# Patient Record
Sex: Female | Born: 1940 | Race: White | Hispanic: No | Marital: Married | Smoking: Never smoker
Health system: Southern US, Community
[De-identification: ages and names within clinical notes are randomized; demographics above are authoritative.]

## PROBLEM LIST (undated history)

## (undated) DIAGNOSIS — Z9889 Other specified postprocedural states: Secondary | ICD-10-CM

## (undated) DIAGNOSIS — C801 Malignant (primary) neoplasm, unspecified: Secondary | ICD-10-CM

## (undated) DIAGNOSIS — R002 Palpitations: Secondary | ICD-10-CM

## (undated) DIAGNOSIS — K589 Irritable bowel syndrome without diarrhea: Secondary | ICD-10-CM

## (undated) DIAGNOSIS — R0689 Other abnormalities of breathing: Secondary | ICD-10-CM

## (undated) DIAGNOSIS — I10 Essential (primary) hypertension: Secondary | ICD-10-CM

## (undated) DIAGNOSIS — R112 Nausea with vomiting, unspecified: Secondary | ICD-10-CM

## (undated) DIAGNOSIS — K219 Gastro-esophageal reflux disease without esophagitis: Secondary | ICD-10-CM

## (undated) DIAGNOSIS — N189 Chronic kidney disease, unspecified: Secondary | ICD-10-CM

## (undated) DIAGNOSIS — R609 Edema, unspecified: Secondary | ICD-10-CM

## (undated) DIAGNOSIS — M791 Myalgia, unspecified site: Secondary | ICD-10-CM

## (undated) DIAGNOSIS — E119 Type 2 diabetes mellitus without complications: Secondary | ICD-10-CM

## (undated) DIAGNOSIS — F32A Depression, unspecified: Secondary | ICD-10-CM

## (undated) HISTORY — DX: Palpitations: R00.2

## (undated) HISTORY — DX: Type 2 diabetes mellitus without complications: E11.9

## (undated) HISTORY — DX: Irritable bowel syndrome, unspecified: K58.9

## (undated) HISTORY — PX: COLONOSCOPY: SHX174

## (undated) HISTORY — DX: Essential (primary) hypertension: I10

## (undated) HISTORY — DX: Malignant (primary) neoplasm, unspecified: C80.1

## (undated) HISTORY — PX: BREAST SURGERY: SHX581

## (undated) HISTORY — PX: ABDOMINAL HYSTERECTOMY: SHX81

## (undated) HISTORY — PX: CHOLECYSTECTOMY: SHX55

## (undated) HISTORY — PX: OVARY SURGERY: SHX727

## (undated) HISTORY — DX: Other abnormalities of breathing: R06.89

## (undated) HISTORY — DX: Edema, unspecified: R60.9

## (undated) HISTORY — PX: ANKLE FRACTURE SURGERY: SHX122

## (undated) HISTORY — DX: Myalgia, unspecified site: M79.10

## (undated) HISTORY — DX: Gastro-esophageal reflux disease without esophagitis: K21.9

---

## 2011-12-20 DIAGNOSIS — N951 Menopausal and female climacteric states: Secondary | ICD-10-CM | POA: Diagnosis not present

## 2011-12-20 DIAGNOSIS — E7800 Pure hypercholesterolemia, unspecified: Secondary | ICD-10-CM | POA: Diagnosis not present

## 2011-12-20 DIAGNOSIS — F411 Generalized anxiety disorder: Secondary | ICD-10-CM | POA: Diagnosis not present

## 2011-12-20 DIAGNOSIS — K219 Gastro-esophageal reflux disease without esophagitis: Secondary | ICD-10-CM | POA: Diagnosis not present

## 2012-01-01 DIAGNOSIS — J209 Acute bronchitis, unspecified: Secondary | ICD-10-CM | POA: Diagnosis not present

## 2012-01-01 DIAGNOSIS — E7800 Pure hypercholesterolemia, unspecified: Secondary | ICD-10-CM | POA: Diagnosis not present

## 2012-01-01 DIAGNOSIS — N951 Menopausal and female climacteric states: Secondary | ICD-10-CM | POA: Diagnosis not present

## 2012-01-01 DIAGNOSIS — R252 Cramp and spasm: Secondary | ICD-10-CM | POA: Diagnosis not present

## 2012-01-06 DIAGNOSIS — R252 Cramp and spasm: Secondary | ICD-10-CM | POA: Diagnosis not present

## 2012-01-06 DIAGNOSIS — N951 Menopausal and female climacteric states: Secondary | ICD-10-CM | POA: Diagnosis not present

## 2012-01-06 DIAGNOSIS — R197 Diarrhea, unspecified: Secondary | ICD-10-CM | POA: Diagnosis not present

## 2012-01-06 DIAGNOSIS — E7800 Pure hypercholesterolemia, unspecified: Secondary | ICD-10-CM | POA: Diagnosis not present

## 2012-01-15 DIAGNOSIS — K591 Functional diarrhea: Secondary | ICD-10-CM | POA: Diagnosis not present

## 2012-01-16 DIAGNOSIS — K591 Functional diarrhea: Secondary | ICD-10-CM | POA: Diagnosis not present

## 2012-02-05 DIAGNOSIS — K591 Functional diarrhea: Secondary | ICD-10-CM | POA: Diagnosis not present

## 2012-02-21 DIAGNOSIS — Z87891 Personal history of nicotine dependence: Secondary | ICD-10-CM | POA: Diagnosis not present

## 2012-02-21 DIAGNOSIS — I10 Essential (primary) hypertension: Secondary | ICD-10-CM | POA: Diagnosis not present

## 2012-02-21 DIAGNOSIS — Z853 Personal history of malignant neoplasm of breast: Secondary | ICD-10-CM | POA: Diagnosis not present

## 2012-02-21 DIAGNOSIS — Z8719 Personal history of other diseases of the digestive system: Secondary | ICD-10-CM | POA: Diagnosis not present

## 2012-02-21 DIAGNOSIS — D1330 Benign neoplasm of unspecified part of small intestine: Secondary | ICD-10-CM | POA: Diagnosis not present

## 2012-02-21 DIAGNOSIS — Z79899 Other long term (current) drug therapy: Secondary | ICD-10-CM | POA: Diagnosis not present

## 2012-02-21 DIAGNOSIS — D126 Benign neoplasm of colon, unspecified: Secondary | ICD-10-CM | POA: Diagnosis not present

## 2012-02-21 DIAGNOSIS — Z1211 Encounter for screening for malignant neoplasm of colon: Secondary | ICD-10-CM | POA: Diagnosis not present

## 2012-02-21 DIAGNOSIS — R197 Diarrhea, unspecified: Secondary | ICD-10-CM | POA: Diagnosis not present

## 2012-02-21 DIAGNOSIS — Z86010 Personal history of colonic polyps: Secondary | ICD-10-CM | POA: Diagnosis not present

## 2012-02-21 DIAGNOSIS — E7800 Pure hypercholesterolemia, unspecified: Secondary | ICD-10-CM | POA: Diagnosis not present

## 2012-02-21 DIAGNOSIS — F411 Generalized anxiety disorder: Secondary | ICD-10-CM | POA: Diagnosis not present

## 2012-02-21 DIAGNOSIS — E119 Type 2 diabetes mellitus without complications: Secondary | ICD-10-CM | POA: Diagnosis not present

## 2012-02-24 DIAGNOSIS — C50419 Malignant neoplasm of upper-outer quadrant of unspecified female breast: Secondary | ICD-10-CM | POA: Diagnosis not present

## 2012-02-27 DIAGNOSIS — L820 Inflamed seborrheic keratosis: Secondary | ICD-10-CM | POA: Diagnosis not present

## 2012-02-27 DIAGNOSIS — L723 Sebaceous cyst: Secondary | ICD-10-CM | POA: Diagnosis not present

## 2012-03-19 DIAGNOSIS — R252 Cramp and spasm: Secondary | ICD-10-CM | POA: Diagnosis not present

## 2012-03-19 DIAGNOSIS — E7800 Pure hypercholesterolemia, unspecified: Secondary | ICD-10-CM | POA: Diagnosis not present

## 2012-03-19 DIAGNOSIS — Z6826 Body mass index (BMI) 26.0-26.9, adult: Secondary | ICD-10-CM | POA: Diagnosis not present

## 2012-03-19 DIAGNOSIS — F411 Generalized anxiety disorder: Secondary | ICD-10-CM | POA: Diagnosis not present

## 2012-03-19 DIAGNOSIS — E119 Type 2 diabetes mellitus without complications: Secondary | ICD-10-CM | POA: Diagnosis not present

## 2012-03-19 DIAGNOSIS — I10 Essential (primary) hypertension: Secondary | ICD-10-CM | POA: Diagnosis not present

## 2012-03-26 DIAGNOSIS — K591 Functional diarrhea: Secondary | ICD-10-CM | POA: Diagnosis not present

## 2012-04-20 DIAGNOSIS — E119 Type 2 diabetes mellitus without complications: Secondary | ICD-10-CM | POA: Diagnosis not present

## 2012-04-20 DIAGNOSIS — N189 Chronic kidney disease, unspecified: Secondary | ICD-10-CM | POA: Diagnosis not present

## 2012-04-20 DIAGNOSIS — E7800 Pure hypercholesterolemia, unspecified: Secondary | ICD-10-CM | POA: Diagnosis not present

## 2012-04-20 DIAGNOSIS — K219 Gastro-esophageal reflux disease without esophagitis: Secondary | ICD-10-CM | POA: Diagnosis not present

## 2012-05-04 DIAGNOSIS — I10 Essential (primary) hypertension: Secondary | ICD-10-CM | POA: Diagnosis not present

## 2012-05-04 DIAGNOSIS — E7800 Pure hypercholesterolemia, unspecified: Secondary | ICD-10-CM | POA: Diagnosis not present

## 2012-05-04 DIAGNOSIS — R252 Cramp and spasm: Secondary | ICD-10-CM | POA: Diagnosis not present

## 2012-05-04 DIAGNOSIS — E119 Type 2 diabetes mellitus without complications: Secondary | ICD-10-CM | POA: Diagnosis not present

## 2012-06-04 DIAGNOSIS — M76899 Other specified enthesopathies of unspecified lower limb, excluding foot: Secondary | ICD-10-CM | POA: Diagnosis not present

## 2012-06-04 DIAGNOSIS — M25559 Pain in unspecified hip: Secondary | ICD-10-CM | POA: Diagnosis not present

## 2012-06-05 DIAGNOSIS — E7800 Pure hypercholesterolemia, unspecified: Secondary | ICD-10-CM | POA: Diagnosis not present

## 2012-06-05 DIAGNOSIS — N951 Menopausal and female climacteric states: Secondary | ICD-10-CM | POA: Diagnosis not present

## 2012-06-05 DIAGNOSIS — E119 Type 2 diabetes mellitus without complications: Secondary | ICD-10-CM | POA: Diagnosis not present

## 2012-06-05 DIAGNOSIS — K219 Gastro-esophageal reflux disease without esophagitis: Secondary | ICD-10-CM | POA: Diagnosis not present

## 2012-06-09 DIAGNOSIS — N318 Other neuromuscular dysfunction of bladder: Secondary | ICD-10-CM | POA: Diagnosis not present

## 2012-06-09 DIAGNOSIS — R109 Unspecified abdominal pain: Secondary | ICD-10-CM | POA: Diagnosis not present

## 2012-06-09 DIAGNOSIS — N390 Urinary tract infection, site not specified: Secondary | ICD-10-CM | POA: Diagnosis not present

## 2012-06-10 DIAGNOSIS — M899 Disorder of bone, unspecified: Secondary | ICD-10-CM | POA: Diagnosis not present

## 2012-06-10 DIAGNOSIS — Z13820 Encounter for screening for osteoporosis: Secondary | ICD-10-CM | POA: Diagnosis not present

## 2012-06-11 DIAGNOSIS — N390 Urinary tract infection, site not specified: Secondary | ICD-10-CM | POA: Diagnosis not present

## 2012-06-12 DIAGNOSIS — M25569 Pain in unspecified knee: Secondary | ICD-10-CM | POA: Diagnosis not present

## 2012-06-12 DIAGNOSIS — M76899 Other specified enthesopathies of unspecified lower limb, excluding foot: Secondary | ICD-10-CM | POA: Diagnosis not present

## 2012-06-12 DIAGNOSIS — M25659 Stiffness of unspecified hip, not elsewhere classified: Secondary | ICD-10-CM | POA: Diagnosis not present

## 2012-06-12 DIAGNOSIS — M6281 Muscle weakness (generalized): Secondary | ICD-10-CM | POA: Diagnosis not present

## 2012-06-12 DIAGNOSIS — M25559 Pain in unspecified hip: Secondary | ICD-10-CM | POA: Diagnosis not present

## 2012-06-12 DIAGNOSIS — IMO0001 Reserved for inherently not codable concepts without codable children: Secondary | ICD-10-CM | POA: Diagnosis not present

## 2012-06-22 DIAGNOSIS — E119 Type 2 diabetes mellitus without complications: Secondary | ICD-10-CM | POA: Diagnosis not present

## 2012-06-22 DIAGNOSIS — R252 Cramp and spasm: Secondary | ICD-10-CM | POA: Diagnosis not present

## 2012-06-22 DIAGNOSIS — I10 Essential (primary) hypertension: Secondary | ICD-10-CM | POA: Diagnosis not present

## 2012-06-22 DIAGNOSIS — E7800 Pure hypercholesterolemia, unspecified: Secondary | ICD-10-CM | POA: Diagnosis not present

## 2012-06-22 DIAGNOSIS — N951 Menopausal and female climacteric states: Secondary | ICD-10-CM | POA: Diagnosis not present

## 2012-06-22 DIAGNOSIS — K219 Gastro-esophageal reflux disease without esophagitis: Secondary | ICD-10-CM | POA: Diagnosis not present

## 2012-06-25 DIAGNOSIS — Z09 Encounter for follow-up examination after completed treatment for conditions other than malignant neoplasm: Secondary | ICD-10-CM | POA: Diagnosis not present

## 2012-06-25 DIAGNOSIS — C50419 Malignant neoplasm of upper-outer quadrant of unspecified female breast: Secondary | ICD-10-CM | POA: Diagnosis not present

## 2012-07-06 DIAGNOSIS — N951 Menopausal and female climacteric states: Secondary | ICD-10-CM | POA: Diagnosis not present

## 2012-07-06 DIAGNOSIS — E7800 Pure hypercholesterolemia, unspecified: Secondary | ICD-10-CM | POA: Diagnosis not present

## 2012-07-06 DIAGNOSIS — R252 Cramp and spasm: Secondary | ICD-10-CM | POA: Diagnosis not present

## 2012-07-06 DIAGNOSIS — I10 Essential (primary) hypertension: Secondary | ICD-10-CM | POA: Diagnosis not present

## 2012-07-13 DIAGNOSIS — E7800 Pure hypercholesterolemia, unspecified: Secondary | ICD-10-CM | POA: Diagnosis not present

## 2012-07-13 DIAGNOSIS — R42 Dizziness and giddiness: Secondary | ICD-10-CM | POA: Diagnosis not present

## 2012-07-13 DIAGNOSIS — I10 Essential (primary) hypertension: Secondary | ICD-10-CM | POA: Diagnosis not present

## 2012-07-13 DIAGNOSIS — R252 Cramp and spasm: Secondary | ICD-10-CM | POA: Diagnosis not present

## 2012-08-07 DIAGNOSIS — I10 Essential (primary) hypertension: Secondary | ICD-10-CM | POA: Diagnosis not present

## 2012-08-07 DIAGNOSIS — R609 Edema, unspecified: Secondary | ICD-10-CM | POA: Diagnosis not present

## 2012-08-07 DIAGNOSIS — N951 Menopausal and female climacteric states: Secondary | ICD-10-CM | POA: Diagnosis not present

## 2012-08-07 DIAGNOSIS — E7800 Pure hypercholesterolemia, unspecified: Secondary | ICD-10-CM | POA: Diagnosis not present

## 2012-08-24 DIAGNOSIS — C50419 Malignant neoplasm of upper-outer quadrant of unspecified female breast: Secondary | ICD-10-CM | POA: Diagnosis not present

## 2012-09-22 DIAGNOSIS — Z23 Encounter for immunization: Secondary | ICD-10-CM | POA: Diagnosis not present

## 2012-09-22 DIAGNOSIS — Z6828 Body mass index (BMI) 28.0-28.9, adult: Secondary | ICD-10-CM | POA: Diagnosis not present

## 2012-09-22 DIAGNOSIS — E119 Type 2 diabetes mellitus without complications: Secondary | ICD-10-CM | POA: Diagnosis not present

## 2012-09-22 DIAGNOSIS — K219 Gastro-esophageal reflux disease without esophagitis: Secondary | ICD-10-CM | POA: Diagnosis not present

## 2012-09-22 DIAGNOSIS — N951 Menopausal and female climacteric states: Secondary | ICD-10-CM | POA: Diagnosis not present

## 2012-09-22 DIAGNOSIS — I10 Essential (primary) hypertension: Secondary | ICD-10-CM | POA: Diagnosis not present

## 2012-09-22 DIAGNOSIS — E7800 Pure hypercholesterolemia, unspecified: Secondary | ICD-10-CM | POA: Diagnosis not present

## 2012-11-02 DIAGNOSIS — C50419 Malignant neoplasm of upper-outer quadrant of unspecified female breast: Secondary | ICD-10-CM | POA: Diagnosis not present

## 2012-11-02 DIAGNOSIS — E119 Type 2 diabetes mellitus without complications: Secondary | ICD-10-CM | POA: Diagnosis not present

## 2012-11-02 DIAGNOSIS — K219 Gastro-esophageal reflux disease without esophagitis: Secondary | ICD-10-CM | POA: Diagnosis not present

## 2012-11-02 DIAGNOSIS — I10 Essential (primary) hypertension: Secondary | ICD-10-CM | POA: Diagnosis not present

## 2012-11-02 DIAGNOSIS — C50919 Malignant neoplasm of unspecified site of unspecified female breast: Secondary | ICD-10-CM | POA: Diagnosis not present

## 2012-11-02 DIAGNOSIS — Z09 Encounter for follow-up examination after completed treatment for conditions other than malignant neoplasm: Secondary | ICD-10-CM | POA: Diagnosis not present

## 2012-11-11 DIAGNOSIS — M21169 Varus deformity, not elsewhere classified, unspecified knee: Secondary | ICD-10-CM | POA: Diagnosis not present

## 2012-11-11 DIAGNOSIS — R269 Unspecified abnormalities of gait and mobility: Secondary | ICD-10-CM | POA: Diagnosis not present

## 2012-11-11 DIAGNOSIS — M25569 Pain in unspecified knee: Secondary | ICD-10-CM | POA: Diagnosis not present

## 2012-11-11 DIAGNOSIS — M1710 Unilateral primary osteoarthritis, unspecified knee: Secondary | ICD-10-CM | POA: Diagnosis not present

## 2012-11-25 DIAGNOSIS — E119 Type 2 diabetes mellitus without complications: Secondary | ICD-10-CM | POA: Diagnosis not present

## 2012-12-02 DIAGNOSIS — Z6828 Body mass index (BMI) 28.0-28.9, adult: Secondary | ICD-10-CM | POA: Diagnosis not present

## 2012-12-02 DIAGNOSIS — N189 Chronic kidney disease, unspecified: Secondary | ICD-10-CM | POA: Diagnosis not present

## 2012-12-02 DIAGNOSIS — I10 Essential (primary) hypertension: Secondary | ICD-10-CM | POA: Diagnosis not present

## 2012-12-02 DIAGNOSIS — K219 Gastro-esophageal reflux disease without esophagitis: Secondary | ICD-10-CM | POA: Diagnosis not present

## 2012-12-21 DIAGNOSIS — E7800 Pure hypercholesterolemia, unspecified: Secondary | ICD-10-CM | POA: Diagnosis not present

## 2012-12-21 DIAGNOSIS — Z9181 History of falling: Secondary | ICD-10-CM | POA: Diagnosis not present

## 2012-12-21 DIAGNOSIS — Z1331 Encounter for screening for depression: Secondary | ICD-10-CM | POA: Diagnosis not present

## 2012-12-21 DIAGNOSIS — K219 Gastro-esophageal reflux disease without esophagitis: Secondary | ICD-10-CM | POA: Diagnosis not present

## 2012-12-21 DIAGNOSIS — E119 Type 2 diabetes mellitus without complications: Secondary | ICD-10-CM | POA: Diagnosis not present

## 2012-12-21 DIAGNOSIS — I10 Essential (primary) hypertension: Secondary | ICD-10-CM | POA: Diagnosis not present

## 2013-01-07 DIAGNOSIS — Z6829 Body mass index (BMI) 29.0-29.9, adult: Secondary | ICD-10-CM | POA: Diagnosis not present

## 2013-01-07 DIAGNOSIS — I10 Essential (primary) hypertension: Secondary | ICD-10-CM | POA: Diagnosis not present

## 2013-01-07 DIAGNOSIS — J209 Acute bronchitis, unspecified: Secondary | ICD-10-CM | POA: Diagnosis not present

## 2013-01-07 DIAGNOSIS — K219 Gastro-esophageal reflux disease without esophagitis: Secondary | ICD-10-CM | POA: Diagnosis not present

## 2013-01-07 DIAGNOSIS — E7800 Pure hypercholesterolemia, unspecified: Secondary | ICD-10-CM | POA: Diagnosis not present

## 2013-01-20 DIAGNOSIS — B351 Tinea unguium: Secondary | ICD-10-CM | POA: Diagnosis not present

## 2013-01-20 DIAGNOSIS — E1149 Type 2 diabetes mellitus with other diabetic neurological complication: Secondary | ICD-10-CM | POA: Diagnosis not present

## 2013-01-21 DIAGNOSIS — K219 Gastro-esophageal reflux disease without esophagitis: Secondary | ICD-10-CM | POA: Diagnosis not present

## 2013-01-21 DIAGNOSIS — I10 Essential (primary) hypertension: Secondary | ICD-10-CM | POA: Diagnosis not present

## 2013-01-21 DIAGNOSIS — C50919 Malignant neoplasm of unspecified site of unspecified female breast: Secondary | ICD-10-CM | POA: Diagnosis not present

## 2013-01-21 DIAGNOSIS — Z6829 Body mass index (BMI) 29.0-29.9, adult: Secondary | ICD-10-CM | POA: Diagnosis not present

## 2013-02-24 DIAGNOSIS — L820 Inflamed seborrheic keratosis: Secondary | ICD-10-CM | POA: Diagnosis not present

## 2013-02-24 DIAGNOSIS — L821 Other seborrheic keratosis: Secondary | ICD-10-CM | POA: Diagnosis not present

## 2013-03-01 DIAGNOSIS — H8110 Benign paroxysmal vertigo, unspecified ear: Secondary | ICD-10-CM | POA: Diagnosis not present

## 2013-03-01 DIAGNOSIS — H9190 Unspecified hearing loss, unspecified ear: Secondary | ICD-10-CM | POA: Diagnosis not present

## 2013-03-01 DIAGNOSIS — R42 Dizziness and giddiness: Secondary | ICD-10-CM | POA: Diagnosis not present

## 2013-03-01 DIAGNOSIS — J342 Deviated nasal septum: Secondary | ICD-10-CM | POA: Diagnosis not present

## 2013-03-17 DIAGNOSIS — H903 Sensorineural hearing loss, bilateral: Secondary | ICD-10-CM | POA: Diagnosis not present

## 2013-03-17 DIAGNOSIS — H8110 Benign paroxysmal vertigo, unspecified ear: Secondary | ICD-10-CM | POA: Diagnosis not present

## 2013-03-17 DIAGNOSIS — R42 Dizziness and giddiness: Secondary | ICD-10-CM | POA: Diagnosis not present

## 2013-03-30 DIAGNOSIS — H8110 Benign paroxysmal vertigo, unspecified ear: Secondary | ICD-10-CM | POA: Diagnosis not present

## 2013-04-06 DIAGNOSIS — M1710 Unilateral primary osteoarthritis, unspecified knee: Secondary | ICD-10-CM | POA: Diagnosis not present

## 2013-05-04 DIAGNOSIS — Z09 Encounter for follow-up examination after completed treatment for conditions other than malignant neoplasm: Secondary | ICD-10-CM | POA: Diagnosis not present

## 2013-05-04 DIAGNOSIS — C50419 Malignant neoplasm of upper-outer quadrant of unspecified female breast: Secondary | ICD-10-CM | POA: Diagnosis not present

## 2013-05-04 DIAGNOSIS — D692 Other nonthrombocytopenic purpura: Secondary | ICD-10-CM | POA: Diagnosis not present

## 2013-05-18 DIAGNOSIS — M1710 Unilateral primary osteoarthritis, unspecified knee: Secondary | ICD-10-CM | POA: Diagnosis not present

## 2013-06-03 DIAGNOSIS — E119 Type 2 diabetes mellitus without complications: Secondary | ICD-10-CM | POA: Diagnosis not present

## 2013-06-25 DIAGNOSIS — H2510 Age-related nuclear cataract, unspecified eye: Secondary | ICD-10-CM | POA: Diagnosis not present

## 2013-06-29 DIAGNOSIS — Z6829 Body mass index (BMI) 29.0-29.9, adult: Secondary | ICD-10-CM | POA: Diagnosis not present

## 2013-06-29 DIAGNOSIS — E119 Type 2 diabetes mellitus without complications: Secondary | ICD-10-CM | POA: Diagnosis not present

## 2013-06-29 DIAGNOSIS — N189 Chronic kidney disease, unspecified: Secondary | ICD-10-CM | POA: Diagnosis not present

## 2013-06-29 DIAGNOSIS — K219 Gastro-esophageal reflux disease without esophagitis: Secondary | ICD-10-CM | POA: Diagnosis not present

## 2013-06-29 DIAGNOSIS — I10 Essential (primary) hypertension: Secondary | ICD-10-CM | POA: Diagnosis not present

## 2013-07-13 DIAGNOSIS — I10 Essential (primary) hypertension: Secondary | ICD-10-CM | POA: Diagnosis not present

## 2013-07-13 DIAGNOSIS — H269 Unspecified cataract: Secondary | ICD-10-CM | POA: Diagnosis not present

## 2013-07-13 DIAGNOSIS — H2589 Other age-related cataract: Secondary | ICD-10-CM | POA: Diagnosis not present

## 2013-07-13 DIAGNOSIS — E119 Type 2 diabetes mellitus without complications: Secondary | ICD-10-CM | POA: Diagnosis not present

## 2013-07-27 DIAGNOSIS — H269 Unspecified cataract: Secondary | ICD-10-CM | POA: Diagnosis not present

## 2013-07-27 DIAGNOSIS — I10 Essential (primary) hypertension: Secondary | ICD-10-CM | POA: Diagnosis not present

## 2013-07-27 DIAGNOSIS — H259 Unspecified age-related cataract: Secondary | ICD-10-CM | POA: Diagnosis not present

## 2013-07-27 DIAGNOSIS — E119 Type 2 diabetes mellitus without complications: Secondary | ICD-10-CM | POA: Diagnosis not present

## 2013-07-27 DIAGNOSIS — H2589 Other age-related cataract: Secondary | ICD-10-CM | POA: Diagnosis not present

## 2013-08-04 DIAGNOSIS — Q828 Other specified congenital malformations of skin: Secondary | ICD-10-CM | POA: Diagnosis not present

## 2013-08-04 DIAGNOSIS — E1149 Type 2 diabetes mellitus with other diabetic neurological complication: Secondary | ICD-10-CM | POA: Diagnosis not present

## 2013-08-18 DIAGNOSIS — K219 Gastro-esophageal reflux disease without esophagitis: Secondary | ICD-10-CM | POA: Diagnosis not present

## 2013-08-18 DIAGNOSIS — R5383 Other fatigue: Secondary | ICD-10-CM | POA: Diagnosis not present

## 2013-08-18 DIAGNOSIS — E119 Type 2 diabetes mellitus without complications: Secondary | ICD-10-CM | POA: Diagnosis not present

## 2013-08-18 DIAGNOSIS — R5381 Other malaise: Secondary | ICD-10-CM | POA: Diagnosis not present

## 2013-08-18 DIAGNOSIS — C50919 Malignant neoplasm of unspecified site of unspecified female breast: Secondary | ICD-10-CM | POA: Diagnosis not present

## 2013-08-19 DIAGNOSIS — M1710 Unilateral primary osteoarthritis, unspecified knee: Secondary | ICD-10-CM | POA: Diagnosis not present

## 2013-08-25 DIAGNOSIS — C50419 Malignant neoplasm of upper-outer quadrant of unspecified female breast: Secondary | ICD-10-CM | POA: Diagnosis not present

## 2013-08-25 DIAGNOSIS — Z9889 Other specified postprocedural states: Secondary | ICD-10-CM | POA: Diagnosis not present

## 2013-09-01 DIAGNOSIS — K219 Gastro-esophageal reflux disease without esophagitis: Secondary | ICD-10-CM | POA: Diagnosis not present

## 2013-09-01 DIAGNOSIS — Z6829 Body mass index (BMI) 29.0-29.9, adult: Secondary | ICD-10-CM | POA: Diagnosis not present

## 2013-09-01 DIAGNOSIS — F411 Generalized anxiety disorder: Secondary | ICD-10-CM | POA: Diagnosis not present

## 2013-09-01 DIAGNOSIS — E119 Type 2 diabetes mellitus without complications: Secondary | ICD-10-CM | POA: Diagnosis not present

## 2013-09-01 DIAGNOSIS — E1142 Type 2 diabetes mellitus with diabetic polyneuropathy: Secondary | ICD-10-CM | POA: Diagnosis not present

## 2013-09-13 DIAGNOSIS — M1710 Unilateral primary osteoarthritis, unspecified knee: Secondary | ICD-10-CM | POA: Diagnosis not present

## 2013-09-20 DIAGNOSIS — M1710 Unilateral primary osteoarthritis, unspecified knee: Secondary | ICD-10-CM | POA: Diagnosis not present

## 2013-09-27 DIAGNOSIS — M1710 Unilateral primary osteoarthritis, unspecified knee: Secondary | ICD-10-CM | POA: Diagnosis not present

## 2013-10-12 DIAGNOSIS — E7800 Pure hypercholesterolemia, unspecified: Secondary | ICD-10-CM | POA: Diagnosis not present

## 2013-10-12 DIAGNOSIS — K219 Gastro-esophageal reflux disease without esophagitis: Secondary | ICD-10-CM | POA: Diagnosis not present

## 2013-10-12 DIAGNOSIS — Z6830 Body mass index (BMI) 30.0-30.9, adult: Secondary | ICD-10-CM | POA: Diagnosis not present

## 2013-10-12 DIAGNOSIS — E119 Type 2 diabetes mellitus without complications: Secondary | ICD-10-CM | POA: Diagnosis not present

## 2013-10-12 DIAGNOSIS — I10 Essential (primary) hypertension: Secondary | ICD-10-CM | POA: Diagnosis not present

## 2013-10-12 DIAGNOSIS — Z23 Encounter for immunization: Secondary | ICD-10-CM | POA: Diagnosis not present

## 2013-10-12 DIAGNOSIS — N189 Chronic kidney disease, unspecified: Secondary | ICD-10-CM | POA: Diagnosis not present

## 2013-11-05 DIAGNOSIS — Z09 Encounter for follow-up examination after completed treatment for conditions other than malignant neoplasm: Secondary | ICD-10-CM | POA: Diagnosis not present

## 2013-11-05 DIAGNOSIS — Z853 Personal history of malignant neoplasm of breast: Secondary | ICD-10-CM | POA: Diagnosis not present

## 2014-01-07 DIAGNOSIS — L821 Other seborrheic keratosis: Secondary | ICD-10-CM | POA: Diagnosis not present

## 2014-01-07 DIAGNOSIS — L820 Inflamed seborrheic keratosis: Secondary | ICD-10-CM | POA: Diagnosis not present

## 2014-01-12 DIAGNOSIS — E1142 Type 2 diabetes mellitus with diabetic polyneuropathy: Secondary | ICD-10-CM | POA: Diagnosis not present

## 2014-01-12 DIAGNOSIS — Z9181 History of falling: Secondary | ICD-10-CM | POA: Diagnosis not present

## 2014-01-12 DIAGNOSIS — Z1331 Encounter for screening for depression: Secondary | ICD-10-CM | POA: Diagnosis not present

## 2014-01-12 DIAGNOSIS — G4733 Obstructive sleep apnea (adult) (pediatric): Secondary | ICD-10-CM | POA: Diagnosis not present

## 2014-01-12 DIAGNOSIS — K219 Gastro-esophageal reflux disease without esophagitis: Secondary | ICD-10-CM | POA: Diagnosis not present

## 2014-01-12 DIAGNOSIS — E1149 Type 2 diabetes mellitus with other diabetic neurological complication: Secondary | ICD-10-CM | POA: Diagnosis not present

## 2014-01-12 DIAGNOSIS — I10 Essential (primary) hypertension: Secondary | ICD-10-CM | POA: Diagnosis not present

## 2014-01-13 DIAGNOSIS — E1149 Type 2 diabetes mellitus with other diabetic neurological complication: Secondary | ICD-10-CM | POA: Diagnosis not present

## 2014-01-24 DIAGNOSIS — E1149 Type 2 diabetes mellitus with other diabetic neurological complication: Secondary | ICD-10-CM | POA: Diagnosis not present

## 2014-01-24 DIAGNOSIS — E1142 Type 2 diabetes mellitus with diabetic polyneuropathy: Secondary | ICD-10-CM | POA: Diagnosis not present

## 2014-01-24 DIAGNOSIS — K219 Gastro-esophageal reflux disease without esophagitis: Secondary | ICD-10-CM | POA: Diagnosis not present

## 2014-01-24 DIAGNOSIS — Z6829 Body mass index (BMI) 29.0-29.9, adult: Secondary | ICD-10-CM | POA: Diagnosis not present

## 2014-01-26 ENCOUNTER — Ambulatory Visit (INDEPENDENT_AMBULATORY_CARE_PROVIDER_SITE_OTHER): Payer: Medicare Other

## 2014-01-26 VITALS — BP 131/69 | HR 74 | Resp 16

## 2014-01-26 DIAGNOSIS — M2040 Other hammer toe(s) (acquired), unspecified foot: Secondary | ICD-10-CM

## 2014-01-26 DIAGNOSIS — E1142 Type 2 diabetes mellitus with diabetic polyneuropathy: Secondary | ICD-10-CM

## 2014-01-26 DIAGNOSIS — E1140 Type 2 diabetes mellitus with diabetic neuropathy, unspecified: Secondary | ICD-10-CM

## 2014-01-26 DIAGNOSIS — Q828 Other specified congenital malformations of skin: Secondary | ICD-10-CM

## 2014-01-26 DIAGNOSIS — E1149 Type 2 diabetes mellitus with other diabetic neurological complication: Secondary | ICD-10-CM

## 2014-01-26 NOTE — Progress Notes (Signed)
   Subjective:    Patient ID: Anna Stone, female    DOB: [birth year], 73 y.o.   MRN: [id number]  HPI I am here to get my diabetic shoes    Review of Systems  Endocrine: Positive for heat intolerance.  Neurological: Positive for numbness.       Feet   Hematological: Bruises/bleeds easily.  All other systems reviewed and are negative.       Objective:   Physical Exam Masker status is intact pedal pulses trouble DP postal for PT one over 4 bilateral Refill timed 3-4 seconds patient is dispensed 1 pair of shoes 3 pairs of dual density Plastizote inlays the presence of diabetes and complications patient does have calcium the hallux IP joints MTP joints bilateral feet which is being managed with the orthoses which fit and contour well to the patient's foot. Break in wearing instructions are given to patient all questions asked by the patient are answered at this time.       Assessment & Plan:  Assessment diabetes with peripheral neuropathy digital contractures associated keratoses. Diabetic shoes are dispensed with we use in wearing instructions recommend followup in 3-6 months or as needed for palliative care in the future as was diabetic foot evaluation is as needed. Next  Harriet Masson DPM

## 2014-01-26 NOTE — Patient Instructions (Signed)
Diabetes and Foot Care Diabetes may cause you to have problems because of poor blood supply (circulation) to your feet and legs. This may cause the skin on your feet to become thinner, break easier, and heal more slowly. Your skin may become dry, and the skin may peel and crack. You may also have nerve damage in your legs and feet causing decreased feeling in them. You may not notice minor injuries to your feet that could lead to infections or more serious problems. Taking care of your feet is one of the most important things you can do for yourself.  HOME CARE INSTRUCTIONS  Wear shoes at all times, even in the house. Do not go barefoot. Bare feet are easily injured.  Check your feet daily for blisters, cuts, and redness. If you cannot see the bottom of your feet, use a mirror or ask someone for help.  Wash your feet with warm water (do not use hot water) and mild soap. Then pat your feet and the areas between your toes until they are completely dry. Do not soak your feet as this can dry your skin.  Apply a moisturizing lotion or petroleum jelly (that does not contain alcohol and is unscented) to the skin on your feet and to dry, brittle toenails. Do not apply lotion between your toes.  Trim your toenails straight across. Do not dig under them or around the cuticle. File the edges of your nails with an emery board or nail file.  Do not cut corns or calluses or try to remove them with medicine.  Wear clean socks or stockings every day. Make sure they are not too tight. Do not wear knee-high stockings since they may decrease blood flow to your legs.  Wear shoes that fit properly and have enough cushioning. To break in new shoes, wear them for just a few hours a day. This prevents you from injuring your feet. Always look in your shoes before you put them on to be sure there are no objects inside.  Do not cross your legs. This may decrease the blood flow to your feet.  If you find a minor scrape,  cut, or break in the skin on your feet, keep it and the skin around it clean and dry. These areas may be cleansed with mild soap and water. Do not cleanse the area with peroxide, alcohol, or iodine.  When you remove an adhesive bandage, be sure not to damage the skin around it.  If you have a wound, look at it several times a day to make sure it is healing.  Do not use heating pads or hot water bottles. They may burn your skin. If you have lost feeling in your feet or legs, you may not know it is happening until it is too late.  Make sure your health care provider performs a complete foot exam at least annually or more often if you have foot problems. Report any cuts, sores, or bruises to your health care provider immediately. SEEK MEDICAL CARE IF:   You have an injury that is not healing.  You have cuts or breaks in the skin.  You have an ingrown nail.  You notice redness on your legs or feet.  You feel burning or tingling in your legs or feet.  You have pain or cramps in your legs and feet.  Your legs or feet are numb.  Your feet always feel cold. SEEK IMMEDIATE MEDICAL CARE IF:   There is increasing redness,   swelling, or pain in or around a wound.  There is a red line that goes up your leg.  Pus is coming from a wound.  You develop a fever or as directed by your health care provider.  You notice a bad smell coming from an ulcer or wound. Document Released: 11/29/2000 Document Revised: 08/04/2013 Document Reviewed: 05/11/2013 Exit[employer] Patient Information 2014 [employer].  

## 2014-02-21 DIAGNOSIS — E1149 Type 2 diabetes mellitus with other diabetic neurological complication: Secondary | ICD-10-CM | POA: Diagnosis not present

## 2014-02-21 DIAGNOSIS — K219 Gastro-esophageal reflux disease without esophagitis: Secondary | ICD-10-CM | POA: Diagnosis not present

## 2014-02-21 DIAGNOSIS — N951 Menopausal and female climacteric states: Secondary | ICD-10-CM | POA: Diagnosis not present

## 2014-02-21 DIAGNOSIS — E1142 Type 2 diabetes mellitus with diabetic polyneuropathy: Secondary | ICD-10-CM | POA: Diagnosis not present

## 2014-03-08 DIAGNOSIS — IMO0001 Reserved for inherently not codable concepts without codable children: Secondary | ICD-10-CM | POA: Diagnosis not present

## 2014-03-08 DIAGNOSIS — I10 Essential (primary) hypertension: Secondary | ICD-10-CM | POA: Diagnosis not present

## 2014-03-08 DIAGNOSIS — E782 Mixed hyperlipidemia: Secondary | ICD-10-CM | POA: Diagnosis not present

## 2014-03-08 DIAGNOSIS — Z1389 Encounter for screening for other disorder: Secondary | ICD-10-CM | POA: Diagnosis not present

## 2014-03-09 DIAGNOSIS — E782 Mixed hyperlipidemia: Secondary | ICD-10-CM | POA: Diagnosis not present

## 2014-03-09 DIAGNOSIS — I10 Essential (primary) hypertension: Secondary | ICD-10-CM | POA: Diagnosis not present

## 2014-03-09 DIAGNOSIS — IMO0001 Reserved for inherently not codable concepts without codable children: Secondary | ICD-10-CM | POA: Diagnosis not present

## 2014-03-09 DIAGNOSIS — R0602 Shortness of breath: Secondary | ICD-10-CM | POA: Diagnosis not present

## 2014-03-11 DIAGNOSIS — E782 Mixed hyperlipidemia: Secondary | ICD-10-CM | POA: Diagnosis not present

## 2014-03-11 DIAGNOSIS — IMO0001 Reserved for inherently not codable concepts without codable children: Secondary | ICD-10-CM | POA: Diagnosis not present

## 2014-03-11 DIAGNOSIS — Z6828 Body mass index (BMI) 28.0-28.9, adult: Secondary | ICD-10-CM | POA: Diagnosis not present

## 2014-03-11 DIAGNOSIS — I10 Essential (primary) hypertension: Secondary | ICD-10-CM | POA: Diagnosis not present

## 2014-03-24 DIAGNOSIS — IMO0001 Reserved for inherently not codable concepts without codable children: Secondary | ICD-10-CM | POA: Diagnosis not present

## 2014-05-11 DIAGNOSIS — Z09 Encounter for follow-up examination after completed treatment for conditions other than malignant neoplasm: Secondary | ICD-10-CM | POA: Diagnosis not present

## 2014-05-11 DIAGNOSIS — Z853 Personal history of malignant neoplasm of breast: Secondary | ICD-10-CM | POA: Diagnosis not present

## 2014-06-03 DIAGNOSIS — E782 Mixed hyperlipidemia: Secondary | ICD-10-CM | POA: Diagnosis not present

## 2014-06-03 DIAGNOSIS — I10 Essential (primary) hypertension: Secondary | ICD-10-CM | POA: Diagnosis not present

## 2014-06-03 DIAGNOSIS — IMO0001 Reserved for inherently not codable concepts without codable children: Secondary | ICD-10-CM | POA: Diagnosis not present

## 2014-06-10 DIAGNOSIS — N183 Chronic kidney disease, stage 3 unspecified: Secondary | ICD-10-CM | POA: Diagnosis not present

## 2014-06-10 DIAGNOSIS — I129 Hypertensive chronic kidney disease with stage 1 through stage 4 chronic kidney disease, or unspecified chronic kidney disease: Secondary | ICD-10-CM | POA: Diagnosis not present

## 2014-06-10 DIAGNOSIS — IMO0001 Reserved for inherently not codable concepts without codable children: Secondary | ICD-10-CM | POA: Diagnosis not present

## 2014-06-10 DIAGNOSIS — E782 Mixed hyperlipidemia: Secondary | ICD-10-CM | POA: Diagnosis not present

## 2014-06-10 DIAGNOSIS — Z139 Encounter for screening, unspecified: Secondary | ICD-10-CM | POA: Diagnosis not present

## 2014-07-05 DIAGNOSIS — Z6827 Body mass index (BMI) 27.0-27.9, adult: Secondary | ICD-10-CM | POA: Diagnosis not present

## 2014-07-05 DIAGNOSIS — R110 Nausea: Secondary | ICD-10-CM | POA: Diagnosis not present

## 2014-07-18 DIAGNOSIS — IMO0001 Reserved for inherently not codable concepts without codable children: Secondary | ICD-10-CM | POA: Diagnosis not present

## 2014-07-18 DIAGNOSIS — R42 Dizziness and giddiness: Secondary | ICD-10-CM | POA: Diagnosis not present

## 2014-07-18 DIAGNOSIS — Z6827 Body mass index (BMI) 27.0-27.9, adult: Secondary | ICD-10-CM | POA: Diagnosis not present

## 2014-07-18 DIAGNOSIS — R55 Syncope and collapse: Secondary | ICD-10-CM | POA: Diagnosis not present

## 2014-07-21 DIAGNOSIS — R110 Nausea: Secondary | ICD-10-CM | POA: Diagnosis not present

## 2014-07-21 DIAGNOSIS — R82998 Other abnormal findings in urine: Secondary | ICD-10-CM | POA: Diagnosis not present

## 2014-07-21 DIAGNOSIS — R42 Dizziness and giddiness: Secondary | ICD-10-CM | POA: Diagnosis not present

## 2014-07-21 DIAGNOSIS — R748 Abnormal levels of other serum enzymes: Secondary | ICD-10-CM | POA: Diagnosis not present

## 2014-07-21 DIAGNOSIS — D72829 Elevated white blood cell count, unspecified: Secondary | ICD-10-CM | POA: Diagnosis not present

## 2014-07-22 DIAGNOSIS — I050 Rheumatic mitral stenosis: Secondary | ICD-10-CM | POA: Diagnosis not present

## 2014-07-22 DIAGNOSIS — I059 Rheumatic mitral valve disease, unspecified: Secondary | ICD-10-CM | POA: Diagnosis not present

## 2014-07-22 DIAGNOSIS — I369 Nonrheumatic tricuspid valve disorder, unspecified: Secondary | ICD-10-CM | POA: Diagnosis not present

## 2014-07-22 DIAGNOSIS — I079 Rheumatic tricuspid valve disease, unspecified: Secondary | ICD-10-CM | POA: Diagnosis not present

## 2014-07-22 DIAGNOSIS — R55 Syncope and collapse: Secondary | ICD-10-CM | POA: Diagnosis not present

## 2014-07-25 DIAGNOSIS — IMO0001 Reserved for inherently not codable concepts without codable children: Secondary | ICD-10-CM | POA: Diagnosis not present

## 2014-07-25 DIAGNOSIS — R55 Syncope and collapse: Secondary | ICD-10-CM | POA: Diagnosis not present

## 2014-07-25 DIAGNOSIS — N183 Chronic kidney disease, stage 3 unspecified: Secondary | ICD-10-CM | POA: Diagnosis not present

## 2014-07-25 DIAGNOSIS — Z79899 Other long term (current) drug therapy: Secondary | ICD-10-CM | POA: Diagnosis not present

## 2014-07-25 DIAGNOSIS — I129 Hypertensive chronic kidney disease with stage 1 through stage 4 chronic kidney disease, or unspecified chronic kidney disease: Secondary | ICD-10-CM | POA: Diagnosis not present

## 2014-07-25 DIAGNOSIS — R5383 Other fatigue: Secondary | ICD-10-CM | POA: Diagnosis not present

## 2014-07-25 DIAGNOSIS — E860 Dehydration: Secondary | ICD-10-CM | POA: Diagnosis not present

## 2014-07-25 DIAGNOSIS — R1013 Epigastric pain: Secondary | ICD-10-CM | POA: Diagnosis not present

## 2014-07-25 DIAGNOSIS — R799 Abnormal finding of blood chemistry, unspecified: Secondary | ICD-10-CM | POA: Diagnosis not present

## 2014-07-25 DIAGNOSIS — I658 Occlusion and stenosis of other precerebral arteries: Secondary | ICD-10-CM | POA: Diagnosis not present

## 2014-07-25 DIAGNOSIS — R5381 Other malaise: Secondary | ICD-10-CM | POA: Diagnosis not present

## 2014-07-25 DIAGNOSIS — K7689 Other specified diseases of liver: Secondary | ICD-10-CM | POA: Diagnosis not present

## 2014-07-25 DIAGNOSIS — E119 Type 2 diabetes mellitus without complications: Secondary | ICD-10-CM | POA: Diagnosis not present

## 2014-07-25 DIAGNOSIS — I6529 Occlusion and stenosis of unspecified carotid artery: Secondary | ICD-10-CM | POA: Diagnosis not present

## 2014-07-25 DIAGNOSIS — R112 Nausea with vomiting, unspecified: Secondary | ICD-10-CM | POA: Diagnosis not present

## 2014-07-25 DIAGNOSIS — R109 Unspecified abdominal pain: Secondary | ICD-10-CM | POA: Diagnosis not present

## 2014-07-27 DIAGNOSIS — R112 Nausea with vomiting, unspecified: Secondary | ICD-10-CM | POA: Diagnosis not present

## 2014-07-27 DIAGNOSIS — K219 Gastro-esophageal reflux disease without esophagitis: Secondary | ICD-10-CM | POA: Diagnosis not present

## 2014-07-29 DIAGNOSIS — D721 Eosinophilia, unspecified: Secondary | ICD-10-CM | POA: Diagnosis not present

## 2014-07-29 DIAGNOSIS — N179 Acute kidney failure, unspecified: Secondary | ICD-10-CM | POA: Diagnosis not present

## 2014-07-29 DIAGNOSIS — Z6827 Body mass index (BMI) 27.0-27.9, adult: Secondary | ICD-10-CM | POA: Diagnosis not present

## 2014-08-05 DIAGNOSIS — R1310 Dysphagia, unspecified: Secondary | ICD-10-CM | POA: Diagnosis not present

## 2014-08-05 DIAGNOSIS — Z9089 Acquired absence of other organs: Secondary | ICD-10-CM | POA: Diagnosis not present

## 2014-08-05 DIAGNOSIS — D131 Benign neoplasm of stomach: Secondary | ICD-10-CM | POA: Diagnosis not present

## 2014-08-05 DIAGNOSIS — D1330 Benign neoplasm of unspecified part of small intestine: Secondary | ICD-10-CM | POA: Diagnosis not present

## 2014-08-05 DIAGNOSIS — D721 Eosinophilia, unspecified: Secondary | ICD-10-CM | POA: Diagnosis not present

## 2014-08-05 DIAGNOSIS — K219 Gastro-esophageal reflux disease without esophagitis: Secondary | ICD-10-CM | POA: Diagnosis not present

## 2014-08-05 DIAGNOSIS — F411 Generalized anxiety disorder: Secondary | ICD-10-CM | POA: Diagnosis not present

## 2014-08-05 DIAGNOSIS — I10 Essential (primary) hypertension: Secondary | ICD-10-CM | POA: Diagnosis not present

## 2014-08-05 DIAGNOSIS — Z79899 Other long term (current) drug therapy: Secondary | ICD-10-CM | POA: Diagnosis not present

## 2014-08-05 DIAGNOSIS — K209 Esophagitis, unspecified without bleeding: Secondary | ICD-10-CM | POA: Diagnosis not present

## 2014-08-05 DIAGNOSIS — K449 Diaphragmatic hernia without obstruction or gangrene: Secondary | ICD-10-CM | POA: Diagnosis not present

## 2014-08-05 DIAGNOSIS — K2990 Gastroduodenitis, unspecified, without bleeding: Secondary | ICD-10-CM | POA: Diagnosis not present

## 2014-08-05 DIAGNOSIS — Z90710 Acquired absence of both cervix and uterus: Secondary | ICD-10-CM | POA: Diagnosis not present

## 2014-08-05 DIAGNOSIS — R799 Abnormal finding of blood chemistry, unspecified: Secondary | ICD-10-CM | POA: Diagnosis not present

## 2014-08-05 DIAGNOSIS — Z87891 Personal history of nicotine dependence: Secondary | ICD-10-CM | POA: Diagnosis not present

## 2014-08-05 DIAGNOSIS — Z86010 Personal history of colonic polyps: Secondary | ICD-10-CM | POA: Diagnosis not present

## 2014-08-05 DIAGNOSIS — R112 Nausea with vomiting, unspecified: Secondary | ICD-10-CM | POA: Diagnosis not present

## 2014-08-05 DIAGNOSIS — E119 Type 2 diabetes mellitus without complications: Secondary | ICD-10-CM | POA: Diagnosis not present

## 2014-08-05 DIAGNOSIS — K2970 Gastritis, unspecified, without bleeding: Secondary | ICD-10-CM | POA: Diagnosis not present

## 2014-08-29 DIAGNOSIS — M899 Disorder of bone, unspecified: Secondary | ICD-10-CM | POA: Diagnosis not present

## 2014-08-29 DIAGNOSIS — R922 Inconclusive mammogram: Secondary | ICD-10-CM | POA: Diagnosis not present

## 2014-08-29 DIAGNOSIS — C50419 Malignant neoplasm of upper-outer quadrant of unspecified female breast: Secondary | ICD-10-CM | POA: Diagnosis not present

## 2014-08-29 DIAGNOSIS — M949 Disorder of cartilage, unspecified: Secondary | ICD-10-CM | POA: Diagnosis not present

## 2014-08-29 DIAGNOSIS — Z13820 Encounter for screening for osteoporosis: Secondary | ICD-10-CM | POA: Diagnosis not present

## 2014-09-09 DIAGNOSIS — I10 Essential (primary) hypertension: Secondary | ICD-10-CM | POA: Diagnosis not present

## 2014-09-09 DIAGNOSIS — E782 Mixed hyperlipidemia: Secondary | ICD-10-CM | POA: Diagnosis not present

## 2014-09-09 DIAGNOSIS — IMO0001 Reserved for inherently not codable concepts without codable children: Secondary | ICD-10-CM | POA: Diagnosis not present

## 2014-09-12 DIAGNOSIS — H26499 Other secondary cataract, unspecified eye: Secondary | ICD-10-CM | POA: Diagnosis not present

## 2014-09-16 DIAGNOSIS — E1165 Type 2 diabetes mellitus with hyperglycemia: Secondary | ICD-10-CM | POA: Diagnosis not present

## 2014-09-16 DIAGNOSIS — N183 Chronic kidney disease, stage 3 (moderate): Secondary | ICD-10-CM | POA: Diagnosis not present

## 2014-09-16 DIAGNOSIS — Z1389 Encounter for screening for other disorder: Secondary | ICD-10-CM | POA: Diagnosis not present

## 2014-09-16 DIAGNOSIS — Z0000 Encounter for general adult medical examination without abnormal findings: Secondary | ICD-10-CM | POA: Diagnosis not present

## 2014-09-16 DIAGNOSIS — Z139 Encounter for screening, unspecified: Secondary | ICD-10-CM | POA: Diagnosis not present

## 2014-09-16 DIAGNOSIS — I129 Hypertensive chronic kidney disease with stage 1 through stage 4 chronic kidney disease, or unspecified chronic kidney disease: Secondary | ICD-10-CM | POA: Diagnosis not present

## 2014-09-16 DIAGNOSIS — E782 Mixed hyperlipidemia: Secondary | ICD-10-CM | POA: Diagnosis not present

## 2014-11-04 DIAGNOSIS — Z79811 Long term (current) use of aromatase inhibitors: Secondary | ICD-10-CM | POA: Diagnosis not present

## 2014-11-04 DIAGNOSIS — Z853 Personal history of malignant neoplasm of breast: Secondary | ICD-10-CM | POA: Diagnosis not present

## 2014-11-04 DIAGNOSIS — M8580 Other specified disorders of bone density and structure, unspecified site: Secondary | ICD-10-CM | POA: Diagnosis not present

## 2014-11-18 DIAGNOSIS — E782 Mixed hyperlipidemia: Secondary | ICD-10-CM | POA: Diagnosis not present

## 2014-11-18 DIAGNOSIS — E1165 Type 2 diabetes mellitus with hyperglycemia: Secondary | ICD-10-CM | POA: Diagnosis not present

## 2014-11-18 DIAGNOSIS — I129 Hypertensive chronic kidney disease with stage 1 through stage 4 chronic kidney disease, or unspecified chronic kidney disease: Secondary | ICD-10-CM | POA: Diagnosis not present

## 2014-12-02 DIAGNOSIS — N183 Chronic kidney disease, stage 3 (moderate): Secondary | ICD-10-CM | POA: Diagnosis not present

## 2014-12-02 DIAGNOSIS — I129 Hypertensive chronic kidney disease with stage 1 through stage 4 chronic kidney disease, or unspecified chronic kidney disease: Secondary | ICD-10-CM | POA: Diagnosis not present

## 2014-12-02 DIAGNOSIS — E782 Mixed hyperlipidemia: Secondary | ICD-10-CM | POA: Diagnosis not present

## 2014-12-02 DIAGNOSIS — E1165 Type 2 diabetes mellitus with hyperglycemia: Secondary | ICD-10-CM | POA: Diagnosis not present

## 2014-12-13 DIAGNOSIS — S73102A Unspecified sprain of left hip, initial encounter: Secondary | ICD-10-CM | POA: Diagnosis not present

## 2014-12-19 DIAGNOSIS — M79605 Pain in left leg: Secondary | ICD-10-CM | POA: Diagnosis not present

## 2015-01-09 DIAGNOSIS — E1165 Type 2 diabetes mellitus with hyperglycemia: Secondary | ICD-10-CM | POA: Diagnosis not present

## 2015-01-09 DIAGNOSIS — G4762 Sleep related leg cramps: Secondary | ICD-10-CM | POA: Diagnosis not present

## 2015-01-09 DIAGNOSIS — I129 Hypertensive chronic kidney disease with stage 1 through stage 4 chronic kidney disease, or unspecified chronic kidney disease: Secondary | ICD-10-CM | POA: Diagnosis not present

## 2015-01-09 DIAGNOSIS — N183 Chronic kidney disease, stage 3 (moderate): Secondary | ICD-10-CM | POA: Diagnosis not present

## 2015-02-23 DIAGNOSIS — E782 Mixed hyperlipidemia: Secondary | ICD-10-CM | POA: Diagnosis not present

## 2015-02-23 DIAGNOSIS — I129 Hypertensive chronic kidney disease with stage 1 through stage 4 chronic kidney disease, or unspecified chronic kidney disease: Secondary | ICD-10-CM | POA: Diagnosis not present

## 2015-02-23 DIAGNOSIS — E1165 Type 2 diabetes mellitus with hyperglycemia: Secondary | ICD-10-CM | POA: Diagnosis not present

## 2015-03-02 DIAGNOSIS — I129 Hypertensive chronic kidney disease with stage 1 through stage 4 chronic kidney disease, or unspecified chronic kidney disease: Secondary | ICD-10-CM | POA: Diagnosis not present

## 2015-03-02 DIAGNOSIS — E1122 Type 2 diabetes mellitus with diabetic chronic kidney disease: Secondary | ICD-10-CM | POA: Diagnosis not present

## 2015-03-02 DIAGNOSIS — E1165 Type 2 diabetes mellitus with hyperglycemia: Secondary | ICD-10-CM | POA: Diagnosis not present

## 2015-03-02 DIAGNOSIS — E1149 Type 2 diabetes mellitus with other diabetic neurological complication: Secondary | ICD-10-CM | POA: Diagnosis not present

## 2015-03-13 DIAGNOSIS — H04123 Dry eye syndrome of bilateral lacrimal glands: Secondary | ICD-10-CM | POA: Diagnosis not present

## 2015-07-06 DIAGNOSIS — E1169 Type 2 diabetes mellitus with other specified complication: Secondary | ICD-10-CM | POA: Diagnosis not present

## 2015-07-06 DIAGNOSIS — E1165 Type 2 diabetes mellitus with hyperglycemia: Secondary | ICD-10-CM | POA: Diagnosis not present

## 2015-07-06 DIAGNOSIS — E1122 Type 2 diabetes mellitus with diabetic chronic kidney disease: Secondary | ICD-10-CM | POA: Diagnosis not present

## 2015-07-06 DIAGNOSIS — E1149 Type 2 diabetes mellitus with other diabetic neurological complication: Secondary | ICD-10-CM | POA: Diagnosis not present

## 2015-07-13 DIAGNOSIS — E785 Hyperlipidemia, unspecified: Secondary | ICD-10-CM | POA: Diagnosis not present

## 2015-07-13 DIAGNOSIS — E1149 Type 2 diabetes mellitus with other diabetic neurological complication: Secondary | ICD-10-CM | POA: Diagnosis not present

## 2015-07-13 DIAGNOSIS — E1165 Type 2 diabetes mellitus with hyperglycemia: Secondary | ICD-10-CM | POA: Diagnosis not present

## 2015-07-13 DIAGNOSIS — E1169 Type 2 diabetes mellitus with other specified complication: Secondary | ICD-10-CM | POA: Diagnosis not present

## 2015-07-19 DIAGNOSIS — R262 Difficulty in walking, not elsewhere classified: Secondary | ICD-10-CM | POA: Diagnosis not present

## 2015-07-19 DIAGNOSIS — M6281 Muscle weakness (generalized): Secondary | ICD-10-CM | POA: Diagnosis not present

## 2015-07-19 DIAGNOSIS — Z9181 History of falling: Secondary | ICD-10-CM | POA: Diagnosis not present

## 2015-07-19 DIAGNOSIS — I10 Essential (primary) hypertension: Secondary | ICD-10-CM | POA: Diagnosis not present

## 2015-07-31 DIAGNOSIS — Z9181 History of falling: Secondary | ICD-10-CM | POA: Diagnosis not present

## 2015-07-31 DIAGNOSIS — R262 Difficulty in walking, not elsewhere classified: Secondary | ICD-10-CM | POA: Diagnosis not present

## 2015-07-31 DIAGNOSIS — I10 Essential (primary) hypertension: Secondary | ICD-10-CM | POA: Diagnosis not present

## 2015-07-31 DIAGNOSIS — M6281 Muscle weakness (generalized): Secondary | ICD-10-CM | POA: Diagnosis not present

## 2015-08-02 DIAGNOSIS — I10 Essential (primary) hypertension: Secondary | ICD-10-CM | POA: Diagnosis not present

## 2015-08-02 DIAGNOSIS — M6281 Muscle weakness (generalized): Secondary | ICD-10-CM | POA: Diagnosis not present

## 2015-08-02 DIAGNOSIS — Z9181 History of falling: Secondary | ICD-10-CM | POA: Diagnosis not present

## 2015-08-02 DIAGNOSIS — R262 Difficulty in walking, not elsewhere classified: Secondary | ICD-10-CM | POA: Diagnosis not present

## 2015-08-14 DIAGNOSIS — M6281 Muscle weakness (generalized): Secondary | ICD-10-CM | POA: Diagnosis not present

## 2015-08-14 DIAGNOSIS — Z9181 History of falling: Secondary | ICD-10-CM | POA: Diagnosis not present

## 2015-08-14 DIAGNOSIS — I10 Essential (primary) hypertension: Secondary | ICD-10-CM | POA: Diagnosis not present

## 2015-08-14 DIAGNOSIS — R262 Difficulty in walking, not elsewhere classified: Secondary | ICD-10-CM | POA: Diagnosis not present

## 2015-08-18 DIAGNOSIS — J069 Acute upper respiratory infection, unspecified: Secondary | ICD-10-CM | POA: Diagnosis not present

## 2015-08-22 DIAGNOSIS — M6281 Muscle weakness (generalized): Secondary | ICD-10-CM | POA: Diagnosis not present

## 2015-08-22 DIAGNOSIS — Z9181 History of falling: Secondary | ICD-10-CM | POA: Diagnosis not present

## 2015-08-22 DIAGNOSIS — I10 Essential (primary) hypertension: Secondary | ICD-10-CM | POA: Diagnosis not present

## 2015-08-22 DIAGNOSIS — R262 Difficulty in walking, not elsewhere classified: Secondary | ICD-10-CM | POA: Diagnosis not present

## 2015-08-23 DIAGNOSIS — M6281 Muscle weakness (generalized): Secondary | ICD-10-CM | POA: Diagnosis not present

## 2015-08-23 DIAGNOSIS — Z9181 History of falling: Secondary | ICD-10-CM | POA: Diagnosis not present

## 2015-08-23 DIAGNOSIS — I10 Essential (primary) hypertension: Secondary | ICD-10-CM | POA: Diagnosis not present

## 2015-08-23 DIAGNOSIS — R262 Difficulty in walking, not elsewhere classified: Secondary | ICD-10-CM | POA: Diagnosis not present

## 2015-08-28 DIAGNOSIS — I10 Essential (primary) hypertension: Secondary | ICD-10-CM | POA: Diagnosis not present

## 2015-08-28 DIAGNOSIS — Z9181 History of falling: Secondary | ICD-10-CM | POA: Diagnosis not present

## 2015-08-28 DIAGNOSIS — R262 Difficulty in walking, not elsewhere classified: Secondary | ICD-10-CM | POA: Diagnosis not present

## 2015-08-28 DIAGNOSIS — M6281 Muscle weakness (generalized): Secondary | ICD-10-CM | POA: Diagnosis not present

## 2015-08-30 DIAGNOSIS — R262 Difficulty in walking, not elsewhere classified: Secondary | ICD-10-CM | POA: Diagnosis not present

## 2015-08-30 DIAGNOSIS — M6281 Muscle weakness (generalized): Secondary | ICD-10-CM | POA: Diagnosis not present

## 2015-08-30 DIAGNOSIS — Z9181 History of falling: Secondary | ICD-10-CM | POA: Diagnosis not present

## 2015-08-30 DIAGNOSIS — I10 Essential (primary) hypertension: Secondary | ICD-10-CM | POA: Diagnosis not present

## 2015-09-04 DIAGNOSIS — Z853 Personal history of malignant neoplasm of breast: Secondary | ICD-10-CM | POA: Diagnosis not present

## 2015-09-04 DIAGNOSIS — R921 Mammographic calcification found on diagnostic imaging of breast: Secondary | ICD-10-CM | POA: Diagnosis not present

## 2015-09-04 DIAGNOSIS — R928 Other abnormal and inconclusive findings on diagnostic imaging of breast: Secondary | ICD-10-CM | POA: Diagnosis not present

## 2015-09-04 DIAGNOSIS — Z923 Personal history of irradiation: Secondary | ICD-10-CM | POA: Diagnosis not present

## 2015-09-04 DIAGNOSIS — R922 Inconclusive mammogram: Secondary | ICD-10-CM | POA: Diagnosis not present

## 2015-09-05 DIAGNOSIS — M6281 Muscle weakness (generalized): Secondary | ICD-10-CM | POA: Diagnosis not present

## 2015-09-05 DIAGNOSIS — Z9181 History of falling: Secondary | ICD-10-CM | POA: Diagnosis not present

## 2015-09-05 DIAGNOSIS — I10 Essential (primary) hypertension: Secondary | ICD-10-CM | POA: Diagnosis not present

## 2015-09-05 DIAGNOSIS — R262 Difficulty in walking, not elsewhere classified: Secondary | ICD-10-CM | POA: Diagnosis not present

## 2015-09-06 DIAGNOSIS — Z9181 History of falling: Secondary | ICD-10-CM | POA: Diagnosis not present

## 2015-09-06 DIAGNOSIS — I10 Essential (primary) hypertension: Secondary | ICD-10-CM | POA: Diagnosis not present

## 2015-09-06 DIAGNOSIS — R262 Difficulty in walking, not elsewhere classified: Secondary | ICD-10-CM | POA: Diagnosis not present

## 2015-09-06 DIAGNOSIS — M6281 Muscle weakness (generalized): Secondary | ICD-10-CM | POA: Diagnosis not present

## 2015-10-05 DIAGNOSIS — L600 Ingrowing nail: Secondary | ICD-10-CM | POA: Diagnosis not present

## 2015-10-05 DIAGNOSIS — E1142 Type 2 diabetes mellitus with diabetic polyneuropathy: Secondary | ICD-10-CM | POA: Diagnosis not present

## 2015-10-16 DIAGNOSIS — H53131 Sudden visual loss, right eye: Secondary | ICD-10-CM | POA: Diagnosis not present

## 2015-10-17 DIAGNOSIS — M316 Other giant cell arteritis: Secondary | ICD-10-CM | POA: Diagnosis not present

## 2015-10-17 DIAGNOSIS — Z23 Encounter for immunization: Secondary | ICD-10-CM | POA: Diagnosis not present

## 2015-10-20 DIAGNOSIS — H5461 Unqualified visual loss, right eye, normal vision left eye: Secondary | ICD-10-CM | POA: Diagnosis not present

## 2015-10-20 DIAGNOSIS — G459 Transient cerebral ischemic attack, unspecified: Secondary | ICD-10-CM | POA: Diagnosis not present

## 2015-10-20 DIAGNOSIS — Z6829 Body mass index (BMI) 29.0-29.9, adult: Secondary | ICD-10-CM | POA: Diagnosis not present

## 2015-10-25 DIAGNOSIS — G459 Transient cerebral ischemic attack, unspecified: Secondary | ICD-10-CM | POA: Diagnosis not present

## 2015-10-25 DIAGNOSIS — I6523 Occlusion and stenosis of bilateral carotid arteries: Secondary | ICD-10-CM | POA: Diagnosis not present

## 2015-10-30 DIAGNOSIS — G43109 Migraine with aura, not intractable, without status migrainosus: Secondary | ICD-10-CM | POA: Diagnosis not present

## 2015-10-30 DIAGNOSIS — Z23 Encounter for immunization: Secondary | ICD-10-CM | POA: Diagnosis not present

## 2015-10-30 DIAGNOSIS — Z6830 Body mass index (BMI) 30.0-30.9, adult: Secondary | ICD-10-CM | POA: Diagnosis not present

## 2015-10-30 DIAGNOSIS — R635 Abnormal weight gain: Secondary | ICD-10-CM | POA: Diagnosis not present

## 2015-11-06 DIAGNOSIS — Z79811 Long term (current) use of aromatase inhibitors: Secondary | ICD-10-CM | POA: Diagnosis not present

## 2015-11-06 DIAGNOSIS — Z170 Estrogen receptor positive status [ER+]: Secondary | ICD-10-CM | POA: Diagnosis not present

## 2015-11-06 DIAGNOSIS — Z853 Personal history of malignant neoplasm of breast: Secondary | ICD-10-CM | POA: Diagnosis not present

## 2015-11-06 DIAGNOSIS — C50919 Malignant neoplasm of unspecified site of unspecified female breast: Secondary | ICD-10-CM | POA: Diagnosis not present

## 2015-11-07 DIAGNOSIS — E1169 Type 2 diabetes mellitus with other specified complication: Secondary | ICD-10-CM | POA: Diagnosis not present

## 2015-11-07 DIAGNOSIS — E1165 Type 2 diabetes mellitus with hyperglycemia: Secondary | ICD-10-CM | POA: Diagnosis not present

## 2015-11-07 DIAGNOSIS — E1149 Type 2 diabetes mellitus with other diabetic neurological complication: Secondary | ICD-10-CM | POA: Diagnosis not present

## 2015-11-07 DIAGNOSIS — E1122 Type 2 diabetes mellitus with diabetic chronic kidney disease: Secondary | ICD-10-CM | POA: Diagnosis not present

## 2015-11-14 DIAGNOSIS — N183 Chronic kidney disease, stage 3 (moderate): Secondary | ICD-10-CM | POA: Diagnosis not present

## 2015-11-14 DIAGNOSIS — E119 Type 2 diabetes mellitus without complications: Secondary | ICD-10-CM | POA: Diagnosis not present

## 2015-11-14 DIAGNOSIS — I129 Hypertensive chronic kidney disease with stage 1 through stage 4 chronic kidney disease, or unspecified chronic kidney disease: Secondary | ICD-10-CM | POA: Diagnosis not present

## 2015-11-14 DIAGNOSIS — E1122 Type 2 diabetes mellitus with diabetic chronic kidney disease: Secondary | ICD-10-CM | POA: Diagnosis not present

## 2015-11-22 DIAGNOSIS — H53131 Sudden visual loss, right eye: Secondary | ICD-10-CM | POA: Diagnosis not present

## 2015-11-28 DIAGNOSIS — H5461 Unqualified visual loss, right eye, normal vision left eye: Secondary | ICD-10-CM | POA: Diagnosis not present

## 2015-11-28 DIAGNOSIS — E1122 Type 2 diabetes mellitus with diabetic chronic kidney disease: Secondary | ICD-10-CM | POA: Diagnosis not present

## 2015-11-28 DIAGNOSIS — I129 Hypertensive chronic kidney disease with stage 1 through stage 4 chronic kidney disease, or unspecified chronic kidney disease: Secondary | ICD-10-CM | POA: Diagnosis not present

## 2015-11-28 DIAGNOSIS — N183 Chronic kidney disease, stage 3 (moderate): Secondary | ICD-10-CM | POA: Diagnosis not present

## 2016-01-15 DIAGNOSIS — M7061 Trochanteric bursitis, right hip: Secondary | ICD-10-CM | POA: Diagnosis not present

## 2016-01-17 DIAGNOSIS — M25551 Pain in right hip: Secondary | ICD-10-CM | POA: Diagnosis not present

## 2016-01-17 DIAGNOSIS — M545 Low back pain: Secondary | ICD-10-CM | POA: Diagnosis not present

## 2016-01-19 DIAGNOSIS — M25551 Pain in right hip: Secondary | ICD-10-CM | POA: Diagnosis not present

## 2016-01-19 DIAGNOSIS — M545 Low back pain: Secondary | ICD-10-CM | POA: Diagnosis not present

## 2016-01-22 DIAGNOSIS — M25551 Pain in right hip: Secondary | ICD-10-CM | POA: Diagnosis not present

## 2016-01-22 DIAGNOSIS — M545 Low back pain: Secondary | ICD-10-CM | POA: Diagnosis not present

## 2016-01-24 DIAGNOSIS — M25551 Pain in right hip: Secondary | ICD-10-CM | POA: Diagnosis not present

## 2016-01-24 DIAGNOSIS — M545 Low back pain: Secondary | ICD-10-CM | POA: Diagnosis not present

## 2016-01-30 DIAGNOSIS — M545 Low back pain: Secondary | ICD-10-CM | POA: Diagnosis not present

## 2016-01-30 DIAGNOSIS — M25551 Pain in right hip: Secondary | ICD-10-CM | POA: Diagnosis not present

## 2016-02-01 DIAGNOSIS — M25551 Pain in right hip: Secondary | ICD-10-CM | POA: Diagnosis not present

## 2016-02-01 DIAGNOSIS — M545 Low back pain: Secondary | ICD-10-CM | POA: Diagnosis not present

## 2016-02-05 DIAGNOSIS — M545 Low back pain: Secondary | ICD-10-CM | POA: Diagnosis not present

## 2016-02-05 DIAGNOSIS — M25551 Pain in right hip: Secondary | ICD-10-CM | POA: Diagnosis not present

## 2016-02-07 DIAGNOSIS — M545 Low back pain: Secondary | ICD-10-CM | POA: Diagnosis not present

## 2016-02-07 DIAGNOSIS — M25551 Pain in right hip: Secondary | ICD-10-CM | POA: Diagnosis not present

## 2016-02-12 DIAGNOSIS — M316 Other giant cell arteritis: Secondary | ICD-10-CM | POA: Diagnosis not present

## 2016-02-12 DIAGNOSIS — H35131 Retinopathy of prematurity, stage 2, right eye: Secondary | ICD-10-CM | POA: Diagnosis not present

## 2016-02-26 DIAGNOSIS — M7061 Trochanteric bursitis, right hip: Secondary | ICD-10-CM | POA: Diagnosis not present

## 2016-02-27 DIAGNOSIS — E119 Type 2 diabetes mellitus without complications: Secondary | ICD-10-CM | POA: Diagnosis not present

## 2016-02-27 DIAGNOSIS — E1169 Type 2 diabetes mellitus with other specified complication: Secondary | ICD-10-CM | POA: Diagnosis not present

## 2016-02-27 DIAGNOSIS — E1122 Type 2 diabetes mellitus with diabetic chronic kidney disease: Secondary | ICD-10-CM | POA: Diagnosis not present

## 2016-03-11 DIAGNOSIS — I129 Hypertensive chronic kidney disease with stage 1 through stage 4 chronic kidney disease, or unspecified chronic kidney disease: Secondary | ICD-10-CM | POA: Diagnosis not present

## 2016-03-11 DIAGNOSIS — E785 Hyperlipidemia, unspecified: Secondary | ICD-10-CM | POA: Diagnosis not present

## 2016-03-11 DIAGNOSIS — E1169 Type 2 diabetes mellitus with other specified complication: Secondary | ICD-10-CM | POA: Diagnosis not present

## 2016-03-11 DIAGNOSIS — E1122 Type 2 diabetes mellitus with diabetic chronic kidney disease: Secondary | ICD-10-CM | POA: Diagnosis not present

## 2016-03-23 DIAGNOSIS — E860 Dehydration: Secondary | ICD-10-CM | POA: Diagnosis not present

## 2016-03-23 DIAGNOSIS — R404 Transient alteration of awareness: Secondary | ICD-10-CM | POA: Diagnosis not present

## 2016-03-23 DIAGNOSIS — R0602 Shortness of breath: Secondary | ICD-10-CM | POA: Diagnosis not present

## 2016-03-23 DIAGNOSIS — R55 Syncope and collapse: Secondary | ICD-10-CM | POA: Diagnosis not present

## 2016-04-04 DIAGNOSIS — Z0000 Encounter for general adult medical examination without abnormal findings: Secondary | ICD-10-CM | POA: Diagnosis not present

## 2016-04-04 DIAGNOSIS — Z139 Encounter for screening, unspecified: Secondary | ICD-10-CM | POA: Diagnosis not present

## 2016-04-04 DIAGNOSIS — Z7189 Other specified counseling: Secondary | ICD-10-CM | POA: Diagnosis not present

## 2016-04-04 DIAGNOSIS — Z1389 Encounter for screening for other disorder: Secondary | ICD-10-CM | POA: Diagnosis not present

## 2016-04-26 DIAGNOSIS — M7061 Trochanteric bursitis, right hip: Secondary | ICD-10-CM | POA: Diagnosis not present

## 2016-05-06 DIAGNOSIS — H53131 Sudden visual loss, right eye: Secondary | ICD-10-CM | POA: Diagnosis not present

## 2016-05-20 DIAGNOSIS — M4727 Other spondylosis with radiculopathy, lumbosacral region: Secondary | ICD-10-CM | POA: Diagnosis not present

## 2016-05-20 DIAGNOSIS — M25561 Pain in right knee: Secondary | ICD-10-CM | POA: Diagnosis not present

## 2016-05-28 DIAGNOSIS — M5126 Other intervertebral disc displacement, lumbar region: Secondary | ICD-10-CM | POA: Diagnosis not present

## 2016-05-28 DIAGNOSIS — M4727 Other spondylosis with radiculopathy, lumbosacral region: Secondary | ICD-10-CM | POA: Diagnosis not present

## 2016-05-28 DIAGNOSIS — M25561 Pain in right knee: Secondary | ICD-10-CM | POA: Diagnosis not present

## 2016-05-28 DIAGNOSIS — M84451A Pathological fracture, right femur, initial encounter for fracture: Secondary | ICD-10-CM | POA: Diagnosis not present

## 2016-05-28 DIAGNOSIS — S82831A Other fracture of upper and lower end of right fibula, initial encounter for closed fracture: Secondary | ICD-10-CM | POA: Diagnosis not present

## 2016-05-28 DIAGNOSIS — M5136 Other intervertebral disc degeneration, lumbar region: Secondary | ICD-10-CM | POA: Diagnosis not present

## 2016-05-28 DIAGNOSIS — S72434A Nondisplaced fracture of medial condyle of right femur, initial encounter for closed fracture: Secondary | ICD-10-CM | POA: Diagnosis not present

## 2016-05-29 DIAGNOSIS — M5416 Radiculopathy, lumbar region: Secondary | ICD-10-CM | POA: Diagnosis not present

## 2016-05-31 DIAGNOSIS — M5416 Radiculopathy, lumbar region: Secondary | ICD-10-CM | POA: Diagnosis not present

## 2016-05-31 DIAGNOSIS — M5137 Other intervertebral disc degeneration, lumbosacral region: Secondary | ICD-10-CM | POA: Diagnosis not present

## 2016-05-31 DIAGNOSIS — M5116 Intervertebral disc disorders with radiculopathy, lumbar region: Secondary | ICD-10-CM | POA: Diagnosis not present

## 2016-06-03 DIAGNOSIS — M25561 Pain in right knee: Secondary | ICD-10-CM | POA: Diagnosis not present

## 2016-06-03 DIAGNOSIS — R600 Localized edema: Secondary | ICD-10-CM | POA: Diagnosis not present

## 2016-06-03 DIAGNOSIS — R2689 Other abnormalities of gait and mobility: Secondary | ICD-10-CM | POA: Diagnosis not present

## 2016-06-06 DIAGNOSIS — E1169 Type 2 diabetes mellitus with other specified complication: Secondary | ICD-10-CM | POA: Diagnosis not present

## 2016-06-06 DIAGNOSIS — E119 Type 2 diabetes mellitus without complications: Secondary | ICD-10-CM | POA: Diagnosis not present

## 2016-06-06 DIAGNOSIS — E1122 Type 2 diabetes mellitus with diabetic chronic kidney disease: Secondary | ICD-10-CM | POA: Diagnosis not present

## 2016-06-10 DIAGNOSIS — M25561 Pain in right knee: Secondary | ICD-10-CM | POA: Diagnosis not present

## 2016-06-10 DIAGNOSIS — R600 Localized edema: Secondary | ICD-10-CM | POA: Diagnosis not present

## 2016-06-10 DIAGNOSIS — R2689 Other abnormalities of gait and mobility: Secondary | ICD-10-CM | POA: Diagnosis not present

## 2016-06-10 DIAGNOSIS — M7061 Trochanteric bursitis, right hip: Secondary | ICD-10-CM | POA: Diagnosis not present

## 2016-06-11 DIAGNOSIS — M4806 Spinal stenosis, lumbar region: Secondary | ICD-10-CM | POA: Diagnosis not present

## 2016-06-11 DIAGNOSIS — M25561 Pain in right knee: Secondary | ICD-10-CM | POA: Diagnosis not present

## 2016-06-11 DIAGNOSIS — M5416 Radiculopathy, lumbar region: Secondary | ICD-10-CM | POA: Diagnosis not present

## 2016-06-11 DIAGNOSIS — G894 Chronic pain syndrome: Secondary | ICD-10-CM | POA: Diagnosis not present

## 2016-06-11 DIAGNOSIS — M1288 Other specific arthropathies, not elsewhere classified, other specified site: Secondary | ICD-10-CM | POA: Diagnosis not present

## 2016-06-11 DIAGNOSIS — E119 Type 2 diabetes mellitus without complications: Secondary | ICD-10-CM | POA: Diagnosis not present

## 2016-06-13 DIAGNOSIS — Z6827 Body mass index (BMI) 27.0-27.9, adult: Secondary | ICD-10-CM | POA: Diagnosis not present

## 2016-06-13 DIAGNOSIS — R2689 Other abnormalities of gait and mobility: Secondary | ICD-10-CM | POA: Diagnosis not present

## 2016-06-13 DIAGNOSIS — Z139 Encounter for screening, unspecified: Secondary | ICD-10-CM | POA: Diagnosis not present

## 2016-06-13 DIAGNOSIS — I129 Hypertensive chronic kidney disease with stage 1 through stage 4 chronic kidney disease, or unspecified chronic kidney disease: Secondary | ICD-10-CM | POA: Diagnosis not present

## 2016-06-13 DIAGNOSIS — E1169 Type 2 diabetes mellitus with other specified complication: Secondary | ICD-10-CM | POA: Diagnosis not present

## 2016-06-13 DIAGNOSIS — E1122 Type 2 diabetes mellitus with diabetic chronic kidney disease: Secondary | ICD-10-CM | POA: Diagnosis not present

## 2016-06-13 DIAGNOSIS — M25561 Pain in right knee: Secondary | ICD-10-CM | POA: Diagnosis not present

## 2016-06-13 DIAGNOSIS — R600 Localized edema: Secondary | ICD-10-CM | POA: Diagnosis not present

## 2016-06-13 DIAGNOSIS — E785 Hyperlipidemia, unspecified: Secondary | ICD-10-CM | POA: Diagnosis not present

## 2016-06-13 DIAGNOSIS — E119 Type 2 diabetes mellitus without complications: Secondary | ICD-10-CM | POA: Diagnosis not present

## 2016-06-14 DIAGNOSIS — Z7189 Other specified counseling: Secondary | ICD-10-CM | POA: Diagnosis not present

## 2016-06-17 DIAGNOSIS — R2689 Other abnormalities of gait and mobility: Secondary | ICD-10-CM | POA: Diagnosis not present

## 2016-06-17 DIAGNOSIS — R600 Localized edema: Secondary | ICD-10-CM | POA: Diagnosis not present

## 2016-06-17 DIAGNOSIS — M25561 Pain in right knee: Secondary | ICD-10-CM | POA: Diagnosis not present

## 2016-07-26 DIAGNOSIS — M5416 Radiculopathy, lumbar region: Secondary | ICD-10-CM | POA: Diagnosis not present

## 2016-07-31 DIAGNOSIS — M5416 Radiculopathy, lumbar region: Secondary | ICD-10-CM | POA: Diagnosis not present

## 2016-08-08 DIAGNOSIS — M4806 Spinal stenosis, lumbar region: Secondary | ICD-10-CM | POA: Diagnosis not present

## 2016-08-08 DIAGNOSIS — G894 Chronic pain syndrome: Secondary | ICD-10-CM | POA: Diagnosis not present

## 2016-08-08 DIAGNOSIS — G8929 Other chronic pain: Secondary | ICD-10-CM | POA: Diagnosis not present

## 2016-08-08 DIAGNOSIS — M9983 Other biomechanical lesions of lumbar region: Secondary | ICD-10-CM | POA: Diagnosis not present

## 2016-08-08 DIAGNOSIS — M5441 Lumbago with sciatica, right side: Secondary | ICD-10-CM | POA: Diagnosis not present

## 2016-08-14 ENCOUNTER — Other Ambulatory Visit: Payer: Self-pay

## 2016-08-24 DIAGNOSIS — G8929 Other chronic pain: Secondary | ICD-10-CM | POA: Insufficient documentation

## 2016-08-26 DIAGNOSIS — Z87891 Personal history of nicotine dependence: Secondary | ICD-10-CM | POA: Diagnosis not present

## 2016-08-26 DIAGNOSIS — G8929 Other chronic pain: Secondary | ICD-10-CM | POA: Diagnosis not present

## 2016-08-26 DIAGNOSIS — G4730 Sleep apnea, unspecified: Secondary | ICD-10-CM | POA: Diagnosis not present

## 2016-08-26 DIAGNOSIS — Z882 Allergy status to sulfonamides status: Secondary | ICD-10-CM | POA: Diagnosis not present

## 2016-08-26 DIAGNOSIS — M5416 Radiculopathy, lumbar region: Secondary | ICD-10-CM | POA: Diagnosis not present

## 2016-08-26 DIAGNOSIS — F329 Major depressive disorder, single episode, unspecified: Secondary | ICD-10-CM | POA: Diagnosis not present

## 2016-08-26 DIAGNOSIS — E119 Type 2 diabetes mellitus without complications: Secondary | ICD-10-CM | POA: Diagnosis not present

## 2016-08-26 DIAGNOSIS — Z7982 Long term (current) use of aspirin: Secondary | ICD-10-CM | POA: Diagnosis not present

## 2016-08-26 DIAGNOSIS — Z79899 Other long term (current) drug therapy: Secondary | ICD-10-CM | POA: Diagnosis not present

## 2016-08-26 DIAGNOSIS — M5417 Radiculopathy, lumbosacral region: Secondary | ICD-10-CM | POA: Diagnosis not present

## 2016-09-09 DIAGNOSIS — C50411 Malignant neoplasm of upper-outer quadrant of right female breast: Secondary | ICD-10-CM | POA: Diagnosis not present

## 2016-09-09 DIAGNOSIS — M8589 Other specified disorders of bone density and structure, multiple sites: Secondary | ICD-10-CM | POA: Diagnosis not present

## 2016-09-09 DIAGNOSIS — M85862 Other specified disorders of bone density and structure, left lower leg: Secondary | ICD-10-CM | POA: Diagnosis not present

## 2016-09-09 DIAGNOSIS — R928 Other abnormal and inconclusive findings on diagnostic imaging of breast: Secondary | ICD-10-CM | POA: Diagnosis not present

## 2016-09-23 DIAGNOSIS — M1288 Other specific arthropathies, not elsewhere classified, other specified site: Secondary | ICD-10-CM | POA: Diagnosis not present

## 2016-09-23 DIAGNOSIS — M48062 Spinal stenosis, lumbar region with neurogenic claudication: Secondary | ICD-10-CM | POA: Diagnosis not present

## 2016-09-23 DIAGNOSIS — M7138 Other bursal cyst, other site: Secondary | ICD-10-CM | POA: Diagnosis not present

## 2016-09-23 DIAGNOSIS — M5441 Lumbago with sciatica, right side: Secondary | ICD-10-CM | POA: Diagnosis not present

## 2016-09-26 DIAGNOSIS — Z01818 Encounter for other preprocedural examination: Secondary | ICD-10-CM | POA: Diagnosis not present

## 2016-09-26 DIAGNOSIS — Z23 Encounter for immunization: Secondary | ICD-10-CM | POA: Diagnosis not present

## 2016-09-26 DIAGNOSIS — Z6827 Body mass index (BMI) 27.0-27.9, adult: Secondary | ICD-10-CM | POA: Diagnosis not present

## 2016-09-27 ENCOUNTER — Ambulatory Visit: Payer: Self-pay | Admitting: Physician Assistant

## 2016-10-22 DIAGNOSIS — M5441 Lumbago with sciatica, right side: Secondary | ICD-10-CM | POA: Diagnosis not present

## 2016-10-22 NOTE — Pre-Procedure Instructions (Signed)
Anna Stone  10/22/2016      [facility], [place] [id number] [place] [id number] Phone: [phone] Fax: [phone]    Your procedure is scheduled on Nov 15.  Report to [facility] Admitting at 1130A.M.  Call this number if you have problems the morning of surgery:  [phone]   Remember:  Do not eat food or drink liquids after midnight.  Take these medicines the morning of surgery with A SIP OF WATER anastrozole (Arimidex), Refresh eye drops if needed, Gabapentin (Neurontin), omeprazole (Prilosec)   Stop taking aspirin, BC's, Goody's, Herbal medications, Fish Oil, Ibuprofen, Advil, motrin, Aleve    How to Manage Your Diabetes Before and After Surgery  Why is it important to control my blood sugar before and after surgery? . Improving blood sugar levels before and after surgery helps healing and can limit problems. . A way of improving blood sugar control is eating a healthy diet by: o  Eating less sugar and carbohydrates o  Increasing activity/exercise o  Talking with your doctor about reaching your blood sugar goals . High blood sugars (greater than 180 mg/dL) can raise your risk of infections and slow your recovery, so you will need to focus on controlling your diabetes during the weeks before surgery. . Make sure that the doctor who takes care of your diabetes knows about your planned surgery including the date and location.  How do I manage my blood sugar before surgery? . Check your blood sugar at least 4 times a day, starting 2 days before surgery, to make sure that the level is not too high or low. o Check your blood sugar the morning of your surgery when you wake up and every 2 hours until you get to the Short Stay unit. . If your blood sugar is less than 70 mg/dL, you will need to treat for low blood sugar: o Do not take insulin. o Treat a low blood sugar (less than 70 mg/dL) with  cup of  clear juice (cranberry or apple), 4 glucose tablets, OR glucose gel. o Recheck blood sugar in 15 minutes after treatment (to make sure it is greater than 70 mg/dL). If your blood sugar is not greater than 70 mg/dL on recheck, call [phone] for further instructions. . Report your blood sugar to the short stay nurse when you get to Short Stay.  . If you are admitted to the hospital after surgery: o Your blood sugar will be checked by the staff and you will probably be given insulin after surgery (instead of oral diabetes medicines) to make sure you have good blood sugar levels. o The goal for blood sugar control after surgery is 80-180 mg/dL.         WHAT DO I DO ABOUT MY DIABETES MEDICATION?   Marland Kitchen Do not take oral diabetes medicines (pills) the morning of surgery. Glyburide (Diabeta)    Other Instructions:          Patient Signature:  Date:   Nurse Signature:  Date:   Reviewed and Endorsed by [facility] Patient Education Committee, August 2015  Do not wear jewelry, make-up or nail polish.  Do not wear lotions, powders, or perfumes, or deoderant.  Do not shave 48 hours prior to surgery.  Men may shave face and neck.  Do not bring valuables to the hospital.  [facility] is not responsible for any belongings or valuables.  Contacts, dentures  or bridgework may not be worn into surgery.  Leave your suitcase in the car.  After surgery it may be brought to your room.  For patients admitted to the hospital, discharge time will be determined by your treatment team.  Patients discharged the day of surgery will not be allowed to drive home.    Special instructions:  [place] - Preparing for Surgery  Before surgery, you can play an important role.  Because skin is not sterile, your skin needs to be as free of germs as possible.  You can reduce the number of germs on you skin by washing with CHG (chlorahexidine gluconate) soap before surgery.  CHG is an antiseptic cleaner which  kills germs and bonds with the skin to continue killing germs even after washing.  Please DO NOT use if you have an allergy to CHG or antibacterial soaps.  If your skin becomes reddened/irritated stop using the CHG and inform your nurse when you arrive at Short Stay.  Do not shave (including legs and underarms) for at least 48 hours prior to the first CHG shower.  You may shave your face.  Please follow these instructions carefully:   1.  Shower with CHG Soap the night before surgery and the    morning of Surgery.  2.  If you choose to wash your hair, wash your hair first as usual with your  normal shampoo.  3.  After you shampoo, rinse your hair and body thoroughly to remove the  Shampoo.  4.  Use CHG as you would any other liquid soap.  You can apply chg directly  to the skin and wash gently with scrungie or a clean washcloth.  5.  Apply the CHG Soap to your body ONLY FROM THE NECK DOWN.   Do not use on open wounds or open sores.  Avoid contact with your eyes,  ears, mouth and genitals (private parts).  Wash genitals (private parts)  with your normal soap.  6.  Wash thoroughly, paying special attention to the area where your surgery  will be performed.  7.  Thoroughly rinse your body with warm water from the neck down.  8.  DO NOT shower/wash with your normal soap after using and rinsing off   the CHG Soap.  9.  Pat yourself dry with a clean towel.            10.  Wear clean pajamas.            11.  Place clean sheets on your bed the night of your first shower and do not sleep with pets.  Day of Surgery  Do not apply any lotions/deoderants the morning of surgery.  Please wear clean clothes to the hospital/surgery center.     Please read over the following fact sheets that you were given. Pain Booklet, Coughing and Deep Breathing, MRSA Information and Surgical Site Infection Prevention

## 2016-10-23 ENCOUNTER — Encounter (HOSPITAL_COMMUNITY): Payer: Self-pay

## 2016-10-23 ENCOUNTER — Encounter (HOSPITAL_COMMUNITY)
Admission: RE | Admit: 2016-10-23 | Discharge: 2016-10-23 | Disposition: A | Discharge status: Home / Self Care | Payer: Medicare Other | Source: Ambulatory Visit | Attending: Orthopedic Surgery | Admitting: Orthopedic Surgery

## 2016-10-23 DIAGNOSIS — E119 Type 2 diabetes mellitus without complications: Secondary | ICD-10-CM | POA: Diagnosis not present

## 2016-10-23 DIAGNOSIS — Z01810 Encounter for preprocedural cardiovascular examination: Secondary | ICD-10-CM | POA: Insufficient documentation

## 2016-10-23 DIAGNOSIS — Z01812 Encounter for preprocedural laboratory examination: Secondary | ICD-10-CM | POA: Insufficient documentation

## 2016-10-23 HISTORY — DX: Other specified postprocedural states: Z98.890

## 2016-10-23 HISTORY — DX: Chronic kidney disease, unspecified: N18.9

## 2016-10-23 HISTORY — DX: Nausea with vomiting, unspecified: R11.2

## 2016-10-23 LAB — BASIC METABOLIC PANEL
ANION GAP: 8 (ref 5–15)
BUN: 30 mg/dL — ABNORMAL HIGH (ref 6–20)
CHLORIDE: 103 mmol/L (ref 101–111)
CO2: 29 mmol/L (ref 22–32)
Calcium: 9.3 mg/dL (ref 8.9–10.3)
Creatinine, Ser: 1.37 mg/dL — ABNORMAL HIGH (ref 0.44–1.00)
GFR calc non Af Amer: 36 mL/min — ABNORMAL LOW (ref >60)
GFR, EST AFRICAN AMERICAN: 42 mL/min — AB (ref >60)
Glucose, Bld: 91 mg/dL (ref 65–99)
POTASSIUM: 4 mmol/L (ref 3.5–5.1)
SODIUM: 140 mmol/L (ref 135–145)

## 2016-10-23 LAB — GLUCOSE, CAPILLARY: Glucose-Capillary: 78 mg/dL (ref 65–99)

## 2016-10-23 LAB — CBC
HEMATOCRIT: 37.4 % (ref 36.0–46.0)
HEMOGLOBIN: 11.9 g/dL — AB (ref 12.0–15.0)
MCH: 29 pg (ref 26.0–34.0)
MCHC: 31.8 g/dL (ref 30.0–36.0)
MCV: 91 fL (ref 78.0–100.0)
PLATELETS: 175 10*3/uL (ref 150–400)
RBC: 4.11 MIL/uL (ref 3.87–5.11)
RDW: 14.6 % (ref 11.5–15.5)
WBC: 5.5 10*3/uL (ref 4.0–10.5)

## 2016-10-23 LAB — SURGICAL PCR SCREEN
MRSA, PCR: NEGATIVE
Staphylococcus aureus: NEGATIVE

## 2016-10-23 NOTE — Progress Notes (Addendum)
PCP is Dr. Marco Collie- clearance noted on chart  Denies ever seeing a cardiologist. Denies any chest pain. Denies ever having a card cath or echo. States she had a stress test back  In the 90's. Reports her fasting cbg runs 85-90.

## 2016-10-23 NOTE — Progress Notes (Signed)
Anesthesia Chart Review:  Pt is a 75 year old female scheduled for R L5-S1 decompression, excision of cyst on 10/30/2016 with Anna Schools, MD  - PCP is Anna Collie, MD. Pt was cleared for surgery by Anna Gerlach, PA in Dr. Nyra Stone' practice.    PMH includes:  HTN, DM, CKD (stage 3), palpitations, breast cancer, post-op N/V, GERD. Never smoker. BMI 28  Medications include: arimidex, ASA, lipitor, lasix, glyburide, lisinopril-hctz, prilosec, crestor, spironolactone  Preoperative labs reviewed.   - Glucose 91. HgbA1c pending.  - Cr 1.37, BUN 30  EKG 10/23/16: NSR.  If no changes, I anticipate pt can proceed with surgery as scheduled.   Anna Cass, FNP-BC [facility] Short Stay Surgical Center/Anesthesiology Phone: [phone] 10/23/2016 3:50 PM

## 2016-10-24 LAB — HEMOGLOBIN A1C
Hgb A1c MFr Bld: 5.6 % (ref 4.8–5.6)
Mean Plasma Glucose: 114 mg/dL

## 2016-10-28 DIAGNOSIS — C50411 Malignant neoplasm of upper-outer quadrant of right female breast: Secondary | ICD-10-CM | POA: Diagnosis not present

## 2016-10-28 DIAGNOSIS — Z170 Estrogen receptor positive status [ER+]: Secondary | ICD-10-CM | POA: Diagnosis not present

## 2016-10-28 DIAGNOSIS — M8580 Other specified disorders of bone density and structure, unspecified site: Secondary | ICD-10-CM | POA: Diagnosis not present

## 2016-10-28 DIAGNOSIS — Z853 Personal history of malignant neoplasm of breast: Secondary | ICD-10-CM | POA: Diagnosis not present

## 2016-10-29 MED ORDER — CEFAZOLIN SODIUM-DEXTROSE 2-4 GM/100ML-% IV SOLN
2.0000 g | INTRAVENOUS | Status: AC
  Administered 2016-10-30: 2 g via INTRAVENOUS
  Filled 2016-10-29: qty 100

## 2016-10-30 ENCOUNTER — Inpatient Hospital Stay (HOSPITAL_COMMUNITY): Payer: Medicare Other | Admitting: Anesthesiology

## 2016-10-30 ENCOUNTER — Inpatient Hospital Stay (HOSPITAL_COMMUNITY): Payer: Medicare Other | Admitting: Emergency Medicine

## 2016-10-30 ENCOUNTER — Inpatient Hospital Stay (HOSPITAL_COMMUNITY)
Admission: RE | Admit: 2016-10-30 | Discharge: 2016-10-31 | DRG: 520 | Disposition: A | Discharge status: Home Health | Payer: Medicare Other | Source: Ambulatory Visit | Attending: Orthopedic Surgery | Admitting: Orthopedic Surgery

## 2016-10-30 ENCOUNTER — Encounter (HOSPITAL_COMMUNITY): Payer: Self-pay | Admitting: Urology

## 2016-10-30 ENCOUNTER — Encounter (HOSPITAL_COMMUNITY)
Admission: RE | Disposition: A | Discharge status: Home Health | Payer: Self-pay | Source: Ambulatory Visit | Attending: Orthopedic Surgery

## 2016-10-30 ENCOUNTER — Inpatient Hospital Stay (HOSPITAL_COMMUNITY): Payer: Medicare Other

## 2016-10-30 DIAGNOSIS — Z823 Family history of stroke: Secondary | ICD-10-CM | POA: Diagnosis not present

## 2016-10-30 DIAGNOSIS — M48061 Spinal stenosis, lumbar region without neurogenic claudication: Secondary | ICD-10-CM | POA: Diagnosis not present

## 2016-10-30 DIAGNOSIS — M5441 Lumbago with sciatica, right side: Secondary | ICD-10-CM | POA: Diagnosis present

## 2016-10-30 DIAGNOSIS — Z8249 Family history of ischemic heart disease and other diseases of the circulatory system: Secondary | ICD-10-CM | POA: Diagnosis not present

## 2016-10-30 DIAGNOSIS — E1122 Type 2 diabetes mellitus with diabetic chronic kidney disease: Secondary | ICD-10-CM | POA: Diagnosis present

## 2016-10-30 DIAGNOSIS — Z7984 Long term (current) use of oral hypoglycemic drugs: Secondary | ICD-10-CM

## 2016-10-30 DIAGNOSIS — N183 Chronic kidney disease, stage 3 (moderate): Secondary | ICD-10-CM | POA: Diagnosis present

## 2016-10-30 DIAGNOSIS — M47816 Spondylosis without myelopathy or radiculopathy, lumbar region: Secondary | ICD-10-CM | POA: Diagnosis not present

## 2016-10-30 DIAGNOSIS — M4686 Other specified inflammatory spondylopathies, lumbar region: Secondary | ICD-10-CM | POA: Diagnosis present

## 2016-10-30 DIAGNOSIS — K219 Gastro-esophageal reflux disease without esophagitis: Secondary | ICD-10-CM | POA: Diagnosis present

## 2016-10-30 DIAGNOSIS — Z419 Encounter for procedure for purposes other than remedying health state, unspecified: Secondary | ICD-10-CM

## 2016-10-30 DIAGNOSIS — M48062 Spinal stenosis, lumbar region with neurogenic claudication: Secondary | ICD-10-CM | POA: Diagnosis present

## 2016-10-30 DIAGNOSIS — M5136 Other intervertebral disc degeneration, lumbar region: Secondary | ICD-10-CM | POA: Diagnosis present

## 2016-10-30 DIAGNOSIS — Z833 Family history of diabetes mellitus: Secondary | ICD-10-CM | POA: Diagnosis not present

## 2016-10-30 DIAGNOSIS — M7130 Other bursal cyst, unspecified site: Secondary | ICD-10-CM | POA: Diagnosis present

## 2016-10-30 DIAGNOSIS — I129 Hypertensive chronic kidney disease with stage 1 through stage 4 chronic kidney disease, or unspecified chronic kidney disease: Secondary | ICD-10-CM | POA: Diagnosis present

## 2016-10-30 DIAGNOSIS — G061 Intraspinal abscess and granuloma: Secondary | ICD-10-CM | POA: Diagnosis not present

## 2016-10-30 DIAGNOSIS — M4807 Spinal stenosis, lumbosacral region: Secondary | ICD-10-CM | POA: Diagnosis not present

## 2016-10-30 DIAGNOSIS — Z87891 Personal history of nicotine dependence: Secondary | ICD-10-CM

## 2016-10-30 DIAGNOSIS — M79604 Pain in right leg: Secondary | ICD-10-CM | POA: Diagnosis not present

## 2016-10-30 DIAGNOSIS — Z853 Personal history of malignant neoplasm of breast: Secondary | ICD-10-CM

## 2016-10-30 DIAGNOSIS — E1142 Type 2 diabetes mellitus with diabetic polyneuropathy: Secondary | ICD-10-CM | POA: Diagnosis present

## 2016-10-30 DIAGNOSIS — M7138 Other bursal cyst, other site: Secondary | ICD-10-CM | POA: Diagnosis present

## 2016-10-30 DIAGNOSIS — M5417 Radiculopathy, lumbosacral region: Secondary | ICD-10-CM | POA: Diagnosis not present

## 2016-10-30 HISTORY — PX: DECOMPRESSIVE LUMBAR LAMINECTOMY LEVEL 1: SHX5791

## 2016-10-30 LAB — GLUCOSE, CAPILLARY
GLUCOSE-CAPILLARY: 263 mg/dL — AB (ref 65–99)
Glucose-Capillary: 84 mg/dL (ref 65–99)
Glucose-Capillary: 115 mg/dL — ABNORMAL HIGH (ref 65–99)

## 2016-10-30 SURGERY — DECOMPRESSIVE LUMBAR LAMINECTOMY LEVEL 1
Anesthesia: General | Site: Spine Lumbar | Laterality: Right

## 2016-10-30 MED ORDER — FENTANYL CITRATE (PF) 100 MCG/2ML IJ SOLN
INTRAMUSCULAR | Status: DC | PRN
  Administered 2016-10-30: 25 ug via INTRAVENOUS
  Administered 2016-10-30: 50 ug via INTRAVENOUS
  Administered 2016-10-30 (×3): 25 ug via INTRAVENOUS

## 2016-10-30 MED ORDER — ROCURONIUM BROMIDE 100 MG/10ML IV SOLN
INTRAVENOUS | Status: DC | PRN
  Administered 2016-10-30: 30 mg via INTRAVENOUS

## 2016-10-30 MED ORDER — EPHEDRINE SULFATE 50 MG/ML IJ SOLN
INTRAMUSCULAR | Status: DC | PRN
  Administered 2016-10-30 (×2): 10 mg via INTRAVENOUS

## 2016-10-30 MED ORDER — SUGAMMADEX SODIUM 200 MG/2ML IV SOLN
INTRAVENOUS | Status: AC
  Filled 2016-10-30: qty 2

## 2016-10-30 MED ORDER — DEXAMETHASONE SODIUM PHOSPHATE 10 MG/ML IJ SOLN
INTRAMUSCULAR | Status: AC
  Filled 2016-10-30: qty 1

## 2016-10-30 MED ORDER — METHOCARBAMOL 500 MG PO TABS
500.0000 mg | ORAL_TABLET | Freq: Four times a day (QID) | ORAL | Status: DC | PRN
  Administered 2016-10-30 – 2016-10-31 (×2): 500 mg via ORAL
  Filled 2016-10-30 (×2): qty 1

## 2016-10-30 MED ORDER — LISINOPRIL 20 MG PO TABS
20.0000 mg | ORAL_TABLET | Freq: Every day | ORAL | Status: DC
  Filled 2016-10-30: qty 1

## 2016-10-30 MED ORDER — LACTATED RINGERS IV SOLN
INTRAVENOUS | Status: DC
  Administered 2016-10-30 (×3): via INTRAVENOUS

## 2016-10-30 MED ORDER — SODIUM CHLORIDE 0.9% FLUSH: 3.0000 mL | INTRAVENOUS | Status: DC | PRN

## 2016-10-30 MED ORDER — CEFAZOLIN IN D5W 1 GM/50ML IV SOLN
1.0000 g | Freq: Three times a day (TID) | INTRAVENOUS | Status: AC
  Administered 2016-10-30 – 2016-10-31 (×2): 1 g via INTRAVENOUS
  Filled 2016-10-30 (×2): qty 50

## 2016-10-30 MED ORDER — PHENYLEPHRINE 40 MCG/ML (10ML) SYRINGE FOR IV PUSH (FOR BLOOD PRESSURE SUPPORT)
PREFILLED_SYRINGE | INTRAVENOUS | Status: AC
  Filled 2016-10-30: qty 10

## 2016-10-30 MED ORDER — LIDOCAINE-EPINEPHRINE (PF) 1 %-1:200000 IJ SOLN
INTRAMUSCULAR | Status: DC | PRN
  Administered 2016-10-30: 10 mL

## 2016-10-30 MED ORDER — SODIUM CHLORIDE 0.9 % IV SOLN
INTRAVENOUS | Status: DC | PRN
  Administered 2016-10-30: 50 ug/min via INTRAVENOUS

## 2016-10-30 MED ORDER — SODIUM CHLORIDE 0.9% FLUSH
3.0000 mL | Freq: Two times a day (BID) | INTRAVENOUS | Status: DC
  Administered 2016-10-30: 3 mL via INTRAVENOUS

## 2016-10-30 MED ORDER — OXYCODONE HCL 5 MG PO TABS
10.0000 mg | ORAL_TABLET | ORAL | Status: DC | PRN
  Administered 2016-10-30 – 2016-10-31 (×2): 10 mg via ORAL
  Filled 2016-10-30 (×2): qty 2

## 2016-10-30 MED ORDER — MENTHOL 3 MG MT LOZG: 1.0000 | LOZENGE | OROMUCOSAL | Status: DC | PRN

## 2016-10-30 MED ORDER — ONDANSETRON HCL 4 MG/2ML IJ SOLN
INTRAMUSCULAR | Status: DC | PRN
  Administered 2016-10-30: 4 mg via INTRAVENOUS

## 2016-10-30 MED ORDER — INSULIN ASPART 100 UNIT/ML ~~LOC~~ SOLN: 0.0000 [IU] | SUBCUTANEOUS | Status: DC

## 2016-10-30 MED ORDER — THROMBIN 20000 UNITS EX SOLR
CUTANEOUS | Status: AC
  Filled 2016-10-30: qty 20000

## 2016-10-30 MED ORDER — INSULIN ASPART 100 UNIT/ML ~~LOC~~ SOLN
0.0000 [IU] | Freq: Every day | SUBCUTANEOUS | Status: DC
  Administered 2016-10-30: 3 [IU] via SUBCUTANEOUS

## 2016-10-30 MED ORDER — OXYCODONE-ACETAMINOPHEN 10-325 MG PO TABS: 1.0000 | ORAL_TABLET | ORAL | 0 refills | Status: DC | PRN

## 2016-10-30 MED ORDER — ONDANSETRON HCL 4 MG/2ML IJ SOLN
INTRAMUSCULAR | Status: AC
  Filled 2016-10-30: qty 2

## 2016-10-30 MED ORDER — ACETAMINOPHEN 10 MG/ML IV SOLN
INTRAVENOUS | Status: AC
  Filled 2016-10-30: qty 100

## 2016-10-30 MED ORDER — LACTATED RINGERS IV SOLN: INTRAVENOUS | Status: DC

## 2016-10-30 MED ORDER — GLYBURIDE 2.5 MG PO TABS
2.5000 mg | ORAL_TABLET | Freq: Every day | ORAL | Status: DC
  Administered 2016-10-31: 2.5 mg via ORAL
  Filled 2016-10-30: qty 1

## 2016-10-30 MED ORDER — ACETAMINOPHEN 10 MG/ML IV SOLN
INTRAVENOUS | Status: DC | PRN
Start: 2016-10-30 — End: 2016-10-30

## 2016-10-30 MED ORDER — METHOCARBAMOL 500 MG PO TABS: 500.0000 mg | ORAL_TABLET | Freq: Three times a day (TID) | ORAL | 0 refills | Status: DC | PRN

## 2016-10-30 MED ORDER — LIDOCAINE HCL (CARDIAC) 20 MG/ML IV SOLN
INTRAVENOUS | Status: DC | PRN
  Administered 2016-10-30: 100 mg via INTRAVENOUS

## 2016-10-30 MED ORDER — ACETAMINOPHEN 10 MG/ML IV SOLN
INTRAVENOUS | Status: DC | PRN
  Administered 2016-10-30: 1000 mg via INTRAVENOUS

## 2016-10-30 MED ORDER — SUCCINYLCHOLINE CHLORIDE 200 MG/10ML IV SOSY
PREFILLED_SYRINGE | INTRAVENOUS | Status: AC
  Filled 2016-10-30: qty 10

## 2016-10-30 MED ORDER — ROCURONIUM BROMIDE 10 MG/ML (PF) SYRINGE
PREFILLED_SYRINGE | INTRAVENOUS | Status: AC
  Filled 2016-10-30: qty 10

## 2016-10-30 MED ORDER — MORPHINE SULFATE (PF) 4 MG/ML IV SOLN: 1.0000 mg | INTRAVENOUS | Status: DC | PRN

## 2016-10-30 MED ORDER — HYDROCHLOROTHIAZIDE 12.5 MG PO CAPS
12.5000 mg | ORAL_CAPSULE | Freq: Every day | ORAL | Status: DC
  Filled 2016-10-30: qty 1

## 2016-10-30 MED ORDER — ONDANSETRON HCL 4 MG PO TABS: 4.0000 mg | ORAL_TABLET | Freq: Three times a day (TID) | ORAL | 0 refills | Status: DC | PRN

## 2016-10-30 MED ORDER — METHOCARBAMOL 1000 MG/10ML IJ SOLN
500.0000 mg | Freq: Four times a day (QID) | INTRAVENOUS | Status: DC | PRN
  Filled 2016-10-30: qty 5

## 2016-10-30 MED ORDER — ROPINIROLE HCL 1 MG PO TABS
2.0000 mg | ORAL_TABLET | Freq: Every day | ORAL | Status: DC
  Administered 2016-10-30: 2 mg via ORAL
  Filled 2016-10-30: qty 2

## 2016-10-30 MED ORDER — PHENOL 1.4 % MT LIQD: 1.0000 | OROMUCOSAL | Status: DC | PRN

## 2016-10-30 MED ORDER — FUROSEMIDE 20 MG PO TABS: 20.0000 mg | ORAL_TABLET | Freq: Every day | ORAL | Status: DC

## 2016-10-30 MED ORDER — INSULIN ASPART 100 UNIT/ML ~~LOC~~ SOLN: 0.0000 [IU] | Freq: Three times a day (TID) | SUBCUTANEOUS | Status: DC

## 2016-10-30 MED ORDER — LIDOCAINE 2% (20 MG/ML) 5 ML SYRINGE
INTRAMUSCULAR | Status: AC
  Filled 2016-10-30: qty 5

## 2016-10-30 MED ORDER — ROSUVASTATIN CALCIUM 5 MG PO TABS: 5.0000 mg | ORAL_TABLET | ORAL | Status: DC

## 2016-10-30 MED ORDER — PROPOFOL 10 MG/ML IV BOLUS
INTRAVENOUS | Status: DC | PRN
  Administered 2016-10-30: 140 mg via INTRAVENOUS

## 2016-10-30 MED ORDER — GABAPENTIN 600 MG PO TABS
300.0000 mg | ORAL_TABLET | Freq: Two times a day (BID) | ORAL | Status: DC
Start: 2016-10-30 — End: 2016-10-31
  Administered 2016-10-30: 300 mg via ORAL
  Filled 2016-10-30: qty 1

## 2016-10-30 MED ORDER — SODIUM CHLORIDE 0.9 % IV SOLN: 250.0000 mL | INTRAVENOUS | Status: DC

## 2016-10-30 MED ORDER — LIDOCAINE-EPINEPHRINE (PF) 1 %-1:200000 IJ SOLN
INTRAMUSCULAR | Status: AC
  Filled 2016-10-30: qty 30

## 2016-10-30 MED ORDER — DEXAMETHASONE SODIUM PHOSPHATE 10 MG/ML IJ SOLN
INTRAMUSCULAR | Status: DC | PRN
  Administered 2016-10-30: 10 mg via INTRAVENOUS

## 2016-10-30 MED ORDER — SUGAMMADEX SODIUM 200 MG/2ML IV SOLN
INTRAVENOUS | Status: DC | PRN
  Administered 2016-10-30: 150 mg via INTRAVENOUS

## 2016-10-30 MED ORDER — LISINOPRIL-HYDROCHLOROTHIAZIDE 20-12.5 MG PO TABS: 1.0000 | ORAL_TABLET | Freq: Every day | ORAL | Status: DC

## 2016-10-30 MED ORDER — ONDANSETRON HCL 4 MG/2ML IJ SOLN: 4.0000 mg | INTRAMUSCULAR | Status: DC | PRN

## 2016-10-30 MED ORDER — THROMBIN 20000 UNITS EX SOLR
CUTANEOUS | Status: DC | PRN
  Administered 2016-10-30: 20 mL via TOPICAL

## 2016-10-30 MED ORDER — 0.9 % SODIUM CHLORIDE (POUR BTL) OPTIME
TOPICAL | Status: DC | PRN
  Administered 2016-10-30: 1000 mL

## 2016-10-30 MED ORDER — FENTANYL CITRATE (PF) 100 MCG/2ML IJ SOLN
INTRAMUSCULAR | Status: AC
  Filled 2016-10-30: qty 4

## 2016-10-30 SURGICAL SUPPLY — 53 items
BNDG GAUZE ELAST 4 BULKY (GAUZE/BANDAGES/DRESSINGS) IMPLANT
CLSR STERI-STRIP ANTIMIC 1/2X4 (GAUZE/BANDAGES/DRESSINGS) ×2 IMPLANT
CONT SPEC 4OZ CLIKSEAL STRL BL (MISCELLANEOUS) ×2 IMPLANT
CORDS BIPOLAR (ELECTRODE) ×2 IMPLANT
COVER SURGICAL LIGHT HANDLE (MISCELLANEOUS) ×2 IMPLANT
DRAPE POUCH INSTRU U-SHP 10X18 (DRAPES) ×2 IMPLANT
DRAPE SURG 17X11 SM STRL (DRAPES) ×2 IMPLANT
DRAPE U-SHAPE 47X51 STRL (DRAPES) ×2 IMPLANT
DRSG AQUACEL AG ADV 3.5X 4 (GAUZE/BANDAGES/DRESSINGS) ×2 IMPLANT
DRSG AQUACEL AG ADV 3.5X 6 (GAUZE/BANDAGES/DRESSINGS) ×2 IMPLANT
DURAPREP 26ML APPLICATOR (WOUND CARE) ×2 IMPLANT
ELECT BLADE 4.0 EZ CLEAN MEGAD (MISCELLANEOUS) ×2
ELECT PENCIL ROCKER SW 15FT (MISCELLANEOUS) ×2 IMPLANT
ELECT REM PT RETURN 9FT ADLT (ELECTROSURGICAL) ×2
ELECTRODE BLDE 4.0 EZ CLN MEGD (MISCELLANEOUS) ×1 IMPLANT
ELECTRODE REM PT RTRN 9FT ADLT (ELECTROSURGICAL) ×1 IMPLANT
GLOVE BIO SURGEON STRL SZ 6.5 (GLOVE) ×2 IMPLANT
GLOVE BIOGEL PI IND STRL 6.5 (GLOVE) ×1 IMPLANT
GLOVE BIOGEL PI IND STRL 8.5 (GLOVE) ×1 IMPLANT
GLOVE BIOGEL PI INDICATOR 6.5 (GLOVE) ×1
GLOVE BIOGEL PI INDICATOR 8.5 (GLOVE) ×1
GLOVE SS BIOGEL STRL SZ 8.5 (GLOVE) ×1 IMPLANT
GLOVE SUPERSENSE BIOGEL SZ 8.5 (GLOVE) ×1
GOWN STRL REUS W/ TWL LRG LVL3 (GOWN DISPOSABLE) ×1 IMPLANT
GOWN STRL REUS W/TWL 2XL LVL3 (GOWN DISPOSABLE) ×4 IMPLANT
GOWN STRL REUS W/TWL LRG LVL3 (GOWN DISPOSABLE) ×1
KIT BASIN OR (CUSTOM PROCEDURE TRAY) ×2 IMPLANT
NEEDLE 22X1 1/2 (OR ONLY) (NEEDLE) ×2 IMPLANT
NEEDLE SPNL 18GX3.5 QUINCKE PK (NEEDLE) ×4 IMPLANT
NS IRRIG 1000ML POUR BTL (IV SOLUTION) ×2 IMPLANT
PACK LAMINECTOMY ORTHO (CUSTOM PROCEDURE TRAY) ×2 IMPLANT
PACK UNIVERSAL I (CUSTOM PROCEDURE TRAY) ×2 IMPLANT
PATTIES SURGICAL .5 X.5 (GAUZE/BANDAGES/DRESSINGS) IMPLANT
PATTIES SURGICAL .5 X1 (DISPOSABLE) ×2 IMPLANT
SPONGE LAP 4X18 X RAY DECT (DISPOSABLE) IMPLANT
SPONGE SURGIFOAM ABS GEL 100 (HEMOSTASIS) ×2 IMPLANT
SURGIFLO W/THROMBIN 8M KIT (HEMOSTASIS) ×2 IMPLANT
SUT BONE WAX W31G (SUTURE) ×2 IMPLANT
SUT MON AB 3-0 SH 27 (SUTURE) ×1
SUT MON AB 3-0 SH27 (SUTURE) ×1 IMPLANT
SUT VIC AB 1 CT1 18XCR BRD 8 (SUTURE) ×1 IMPLANT
SUT VIC AB 1 CT1 27 (SUTURE)
SUT VIC AB 1 CT1 27XBRD ANTBC (SUTURE) IMPLANT
SUT VIC AB 1 CT1 8-18 (SUTURE) ×1
SUT VIC AB 2-0 CT1 18 (SUTURE) ×2 IMPLANT
SUT VICRYL 0 UR6 27IN ABS (SUTURE) IMPLANT
SYR BULB IRRIGATION 50ML (SYRINGE) ×2 IMPLANT
SYR CONTROL 10ML LL (SYRINGE) ×2 IMPLANT
TOWEL OR 17X26 10 PK STRL BLUE (TOWEL DISPOSABLE) ×4 IMPLANT
TOWEL OR 17X26 4PK STRL BLUE (TOWEL DISPOSABLE) ×2 IMPLANT
TRAY FOLEY CATH 16FRSI W/METER (SET/KITS/TRAYS/PACK) ×2 IMPLANT
WATER STERILE IRR 1000ML POUR (IV SOLUTION) IMPLANT
YANKAUER SUCT BULB TIP NO VENT (SUCTIONS) ×2 IMPLANT

## 2016-10-30 NOTE — Transfer of Care (Signed)
Immediate Anesthesia Transfer of Care Note  Patient: Anna Stone  Procedure(s) Performed: Procedure(s): RIGHT L5-S1 DECOMPRESSION, EXCISION OF CYST LEVEL 1 (Right)  Patient Location: PACU hold. Recovernig patient in OR 4.  Anesthesia Type:General  Level of Consciousness: awake, oriented and patient cooperative  Airway & Oxygen Therapy: Patient Spontanous Breathing and Patient connected to nasal cannula oxygen  Post-op Assessment: Report given to RN and Post -op Vital signs reviewed and stable  Post vital signs: Reviewed  Last Vitals:  Vitals:   10/30/16 1139  BP: (!) 164/66  Pulse: 82  Resp: 20  Temp: 36.7 C    Last Pain:  Vitals:   10/30/16 1139  TempSrc: Oral         Complications: No apparent anesthesia complications

## 2016-10-30 NOTE — Brief Op Note (Signed)
10/30/2016  3:08 PM  PATIENT:  Creig Hines  75 y.o. female  PRE-OPERATIVE DIAGNOSIS:  L5-S1 FACET CYST,  SPINAL STENOSIS  POST-OPERATIVE DIAGNOSIS:  L5-S1 FACET CYST,  SPINAL STENOSIS  PROCEDURE:  Procedure(s): RIGHT L5-S1 DECOMPRESSION, EXCISION OF CYST LEVEL 1 (Right)  SURGEON:  Surgeon(s) and Role:    * Melina Schools, MD - Primary  PHYSICIAN ASSISTANT:   ASSISTANTS: Carmen Mayo   ANESTHESIA:   general  EBL:  Total I/O In: 1200 [I.V.:1200] Out: 180 [Urine:150; Blood:30]  BLOOD ADMINISTERED:none  DRAINS: none   LOCAL MEDICATIONS USED:  MARCAINE     SPECIMEN:  Source of Specimen:  L5/S1 right facet cyst  DISPOSITION OF SPECIMEN:  PATHOLOGY  COUNTS:  YES  TOURNIQUET:  * No tourniquets in log *  DICTATION: .Other Dictation: Dictation Number [id number]  PLAN OF CARE: Admit to inpatient   PATIENT DISPOSITION:  PACU - hemodynamically stable.

## 2016-10-30 NOTE — Anesthesia Postprocedure Evaluation (Signed)
Anesthesia Post Note  Patient: Anna Stone  Procedure(s) Performed: Procedure(s) (LRB): RIGHT L5-S1 DECOMPRESSION, EXCISION OF CYST LEVEL 1 (Right)  Patient location during evaluation: Nursing Unit Anesthesia Type: General Level of consciousness: awake Pain management: pain level controlled Vital Signs Assessment: post-procedure vital signs reviewed and stable Respiratory status: spontaneous breathing Cardiovascular status: stable Anesthetic complications: no    Last Vitals:  Vitals:   10/30/16 1139 10/30/16 1626  BP: (!) 164/66 135/72  Pulse: 82 79  Resp: 20 18  Temp: 36.7 C 36.4 C    Last Pain:  Vitals:   10/30/16 1139  TempSrc: Oral      LLE Sensation: Full sensation (10/30/16 1620)   RLE Sensation: Full sensation (10/30/16 1620)      EDWARDS,Elexia Friedt

## 2016-10-30 NOTE — Anesthesia Preprocedure Evaluation (Addendum)
Anesthesia Evaluation  Patient identified by MRN, date of birth, ID band Patient awake    Reviewed: Allergy & Precautions, NPO status , Patient's Chart, lab work & pertinent test results  History of Anesthesia Complications (+) PONV and history of anesthetic complications  Airway Mallampati: III  TM Distance: <3 FB Neck ROM: Limited    Dental  (+) Teeth Intact, Dental Advisory Given,    Pulmonary neg pulmonary ROS,    breath sounds clear to auscultation       Cardiovascular hypertension,  Rhythm:Regular Rate:Normal     Neuro/Psych    GI/Hepatic GERD  Medicated and Controlled,  Endo/Other  diabetes, Type 2, Oral Hypoglycemic Agents  Renal/GU Renal InsufficiencyRenal disease     Musculoskeletal   Abdominal   Peds  Hematology negative hematology ROS (+)   Anesthesia Other Findings   Reproductive/Obstetrics                            Anesthesia Physical Anesthesia Plan  ASA: III  Anesthesia Plan: General   Post-op Pain Management:    Induction: Intravenous  Airway Management Planned: Oral ETT and Video Laryngoscope Planned  Additional Equipment:   Intra-op Plan:   Post-operative Plan: Extubation in OR  Informed Consent: I have reviewed the patients History and Physical, chart, labs and discussed the procedure including the risks, benefits and alternatives for the proposed anesthesia with the patient or authorized representative who has indicated his/her understanding and acceptance.     Plan Discussed with: CRNA  Anesthesia Plan Comments:         Anesthesia Quick Evaluation

## 2016-10-30 NOTE — H&P (Signed)
History of Present Illness The patient is a 75 year old female who comes in today for a preoperative History and Physical. The patient is scheduled for a right L5-S1 decompression and excision of cyst to be performed by Dr. Duane Lope D. Rolena Infante, MD at [facility] on 10-30-16 . Please see the hospital record for complete dictated history and physical. the pt has DM type 2. She reports it is well controlled. The pt has stage 3 CKD. the pt reports having sleep apnea but she does not use a CPAP.  Additional reasons for visit:  Transition into care is described as the following: The patient is transitioning into care and a summary of care was reviewed.   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 Allergies Reconciled   Family History  Cerebrovascular Accident  Brother, Father. Congestive Heart Failure  Brother, Father. Diabetes Mellitus  Brother, Father. First Degree Relatives  reported Heart Disease  Brother, Father. Heart disease in female family member before age 51  Hypertension  Brother, Father. Osteoarthritis  Mother. Rheumatoid Arthritis  Mother.  Social History  Children  2 Current drinker  09/23/2016: Currently drinks wine only occasionally per week Current work status  retired Exercise  Exercises rarely; does running / walking Living situation  live with spouse Marital status  married No history of drug/alcohol rehab  Not under pain contract  Number of flights of stairs before winded  greater than 5 Tobacco / smoke exposure  09/23/2016: no Tobacco use  Former smoker. 09/23/2016: smoke(d) less than 1/2 pack(s) per day  Medication History  Pioglitazone HCl (15MG  Tablet, Oral)  Active. Anastrozole (1MG  Tablet, Oral) Active. Furosemide (20MG  Tablet, Oral) Active. Omeprazole (20MG  Capsule DR, Oral) Active. Lisinopril-Hydrochlorothiazide (20-12.5MG  Tablet, Oral) Active. ROPINIRole HCl (2MG  Tablet, Oral) Active. Gabapentin (600MG  Tablet, Oral) Active. TraMADol HCl (50MG  Tablet, Oral) Active. Oscal 500/200 D-3 (500-200MG -UNIT Tablet, Oral) Active. Medications Reconciled  Past Surgical History  Breast Biopsy  right Breast Mass; Local Excision  right Cataract Surgery  bilateral Foot Surgery  right Gallbladder Surgery  laporoscopic Hysterectomy  partial (non-cancerous) Tubal Ligation   Other Problems Breast Cancer  Diabetes Mellitus, Type II  High blood pressure  Peripheral Neuropathy  Sleep Apnea   Vitals  10/22/2016 7:53 AM Weight: 152 lb Height: 61.5in Body Surface Area: 1.69 m Body Mass Index: 28.25 kg/m  Temp.: 97.46F(Oral)  Pulse: 76 (Regular)  BP: 132/91 (Sitting, Left Arm, Standard)  General General Appearance-Not in acute distress. Orientation-Oriented X3. Build & Nutrition-Well nourished and Well developed.  Integumentary General Characteristics Surgical Scars - no surgical scar evidence of previous lumbar surgery. Lumbar Spine-Skin examination of the lumbar spine is without deformity, skin lesions, lacerations or abrasions.  Chest and Lung Exam Auscultation Breath sounds - Normal and Clear.  Cardiovascular Auscultation Rhythm - Regular rate and rhythm.  Abdomen Palpation/Percussion Palpation and Percussion of the abdomen reveal - Soft, Non Tender and No Rebound tenderness.  Peripheral Vascular Lower Extremity Palpation - Posterior tibial pulse - Bilateral - 2+. Dorsalis pedis pulse - Bilateral - 2+.  Neurologic Sensation Lower Extremity - Left - sensation is intact in the lower extremity. Right - sensation is diminished in the lower extremity. Reflexes Patellar Reflex - Bilateral -  2+. Achilles Reflex - Bilateral - 2+. Clonus - Bilateral -  clonus not present. Hoffman's Sign - Bilateral - Hoffman's sign not present. Testing Seated Straight Leg Raise - Left - Seated straight leg raise negative. Right - Seated straight leg raise positive.  Musculoskeletal Spine/Ribs/Pelvis  Lumbosacral Spine: Inspection and Palpation - Tenderness - left lumbar paraspinals tender to palpation and right lumbar paraspinals tender to palpation. Strength and Tone: Strength - Hip Flexion - Bilateral - 5/5. Knee Extension - Bilateral - 5/5. Knee Flexion - Bilateral - 5/5. Ankle Dorsiflexion - Bilateral - 5/5. Ankle Plantarflexion - Bilateral - 5/5. Heel walk - Bilateral - unable to heel walk. Toe Walk - Bilateral - unable to walk on toes. Heel-Toe Walk - Bilateral - unable to heel-toe walk. ROM - Flexion - moderately decreased range of motion and painful. Extension - moderately decreased range of motion and painful. Left Lateral Bending - moderately decreased range of motion and painful. Right Lateral Bending - moderately decreased range of motion and painful. Right Rotation - moderately decreased range of motion and painful. Left Rotation - moderately decreased range of motion and painful. Pain - . Lumbosacral Spine - Waddell's Signs - no Waddell's signs present. Lower Extremity Range of Motion - No true hip, knee or ankle pain with range of motion. Gait and Station - Assistive Devices - rolling walker.  IMAGING MRI from 05/28/2016 shows severe right foraminal and lateral recess stenosis at L5-S1. Moderate to severe left lateral recess stenosis. Slight anterolisthesis at L4-L5, but the neural foramen are patent. She also has L5-S1 synovial cysts which are contributing to the lateral recess stenosis. The right side is larger than the left.   Assessment & Plan   Posterior Lumbar Decompression/disectomy: Risks of surgery include infection, bleeding, nerve damage, death, stroke, paralysis, failure to heal,  need for further surgery, ongoing or worse pain, need for further surgery, CSF leak, loss of bowel or bladder, and recurrent disc herniation or Stenosis which would necessitate need for further surgery.  Goal Of Surgery: Discussed that goal of surgery is to reduce pain and improve function and quality of life. Patient is aware that despite all appropriate treatment that there pain and function could be the same, worse, or different.  At this point in time, we have had a long discussion about surgical intervention. The patient has had two injections. She has had physical therapy and the leg pain continued to deteriorate and cost her significant quality of life. From my standpoint, I think the best course of action is to do a limited right-sided lumbar decompression at L5-S1. She has no significant complaints of back pain. Her major pain is neuropathic right leg pain secondary to the foraminal stenosis and the cyst. I think this can be addressed with an L5-S1 right-sided laminotomy and decompression and partial foraminotomy. I think by treating this, the neuropathic leg pain will improve and her quality of life should improve. I would also excise the synovial cyst just on this right side. The goal would be to prevent the need for fusion. Since we are only addressing the one side, I do not think she is at high risk of having iatrogenic instability. The left facet capsule will be left intact and I will not completely sacrifice the right side. I think with this adequate decompression, her symptoms would improve and her quality of life will improve. We have reviewed the risks of surgery which include infection, bleeding, nerve damage, death, stroke, paralysis, failure to heal, need for further surgery, ongoing or worse pain. All of her and her husband's  questions were addressed.

## 2016-10-30 NOTE — Anesthesia Procedure Notes (Signed)
Procedure Name: Intubation Date/Time: 10/30/2016 12:47 PM Performed by: Jenne Campus Pre-anesthesia Checklist: Patient identified, Emergency Drugs available, Suction available and Patient being monitored Patient Re-evaluated:Patient Re-evaluated prior to inductionOxygen Delivery Method: Circle System Utilized Preoxygenation: Pre-oxygenation with 100% oxygen Intubation Type: IV induction Ventilation: Mask ventilation without difficulty Grade View: Grade I Tube type: Oral Tube size: 7.0 mm Number of attempts: 1 Airway Equipment and Method: Oral airway,  Video-laryngoscopy and Rigid stylet Placement Confirmation: ETT inserted through vocal cords under direct vision,  positive ETCO2 and breath sounds checked- equal and bilateral Secured at: 21 cm Tube secured with: Tape Dental Injury: Teeth and Oropharynx as per pre-operative assessment  Difficulty Due To: Difficulty was anticipated, Difficult Airway- due to limited oral opening and Difficult Airway- due to anterior larynx Future Recommendations: Recommend- induction with short-acting agent, and alternative techniques readily available Comments: Elective video-glide. Patient with small oral opening and thyromental distance <<2. Smooth IV induction. EZ mask. DL x 1 #3 Glidescope blade. Grade 1 view. Atraum intub. +ETCO2 bbse.

## 2016-10-31 ENCOUNTER — Encounter (HOSPITAL_COMMUNITY): Payer: Self-pay | Admitting: Orthopedic Surgery

## 2016-10-31 LAB — GLUCOSE, CAPILLARY: Glucose-Capillary: 90 mg/dL (ref 65–99)

## 2016-10-31 NOTE — Op Note (Signed)
Anna Stone, Anna Stone               ACCOUNT NO.:  [id number]  MEDICAL RECORD NO.:  [id number]  LOCATION:  3C06C                        FACILITY:  [facility]  PHYSICIAN:  Jordyn Doane D. Rolena Infante, M.D. 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  OPERATIVE PROCEDURE:  Right L5-S1, right-sided hemilaminotomy with foraminotomy, decompression, and excision of synovial cyst.  COMPLICATIONS:  None.  CONDITION:  Stable.  FIRST ASSISTANT:  Ronette Deter, my PA.  HISTORY:  This is a very pleasant, elderly woman, who has been complaining of severe neuropathic leg pain and moderate to significant back pain.  MRI imaging demonstrated severe spinal stenosis and foraminal stenosis secondary to a synovial cyst on the right side at L5- S1 with S1 neural compression.  She did have a small cyst on the left side, but it was not as large, and she had no clinical symptoms on the left side.  As a result, we elected to proceed with right-sided decompression.  This would allow excision of the cyst and neural decompression without creating instability.  All appropriate risks, benefits, and alternatives were discussed with the patient.  Consent was obtained.  DESCRIPTION OF PROCEDURE:  The patient was brought to the operating room and placed supine on the operating table.  After successful induction of general anesthesia and endotracheal intubation, TEDs, SCDs, and a Foley were inserted.  She was turned prone onto the [place] frame.  All bony prominences were well padded.  The back was prepped and draped in a standard fashion.  Time-out was taken confirming the patient, procedure, and  all other pertinent important data.  Once this was done, 2 needles were placed in the back, and x-ray was taken to localize the incision. Incision site was marked and infiltrated with 0.25% Marcaine.  The skin was incised, and sharp dissection was carried out down to the fascia. The deep fascia was sharply incised, and I stripped the paraspinal muscles bilaterally to expose the L5 and S1 spinous process and lamina. I placed a Penfield 4 underneath the lamina and took an x-ray.  At this point, I realized I was at the L4 level and so I repositioned my Penfield 4 and took an x-ray.  Once I confirmed the L5 lamina, I placed my self-retaining retractors into the deep and removed a portion and performed a laminotomy with a 2 and 3 mm Kerrison punch on the right side at L5.  Once I had done this, I released the ligamentum flavum from the leading edge of the S1 lamina.  There was significant compression to the thecal sac and calcification within the ligamentum flavum.  I gently began dissecting through the ligamentum flavum with my Penfield 4.  I was then able to expose the cyst.  I continued my decompression into the lateral recess until I had circumferentially decompressed around the cyst.  I identified the S1 nerve root and the  S1 pedicle and performed a foraminotomy with my Kerrison punches.  I then gently began dissecting the synovial cyst off the thecal sac with my Penfield 4.  The cyst was able to freely come off the thecal sac, and I excised it and sent it to pathology.  At this point, I noted that the thecal sac was completely decompressed.  The right lateral recess was completely decompressed, and the S1 nerve root was completely decompressed.  I coagulated the epidural veins with bipolar electrocautery and inspected the disk space itself.  There was no significant herniation.  There was no dorsal displacement of the traversing S1 nerve root.  I could palpate out the L5 and S1 foramen  using my [facility], and there was no significant compression.  I could also palpate superiorly and centrally at the disk space level, and again, there was no significant compression.  At this point with the decompression complete which included medial facetectomy, lateral recess decompression, and foraminotomy, I elected not to go to the contralateral side to remove the smaller cyst on that side.  The patient had baseline grade 1 spondylolisthesis, and I did not want to move forward with potentially destabilizing the left side as well, which would only serve to cause her spondylolisthesis to progress.  As such, I irrigated the wound copiously with normal saline and made sure I had hemostasis using bipolar electrocautery.  At this point, I then closed the deep fascia with a #1 Vicryl suture, superficial with 2-0 Vicryl suture, and a 3-0 Monocryl for the skin.  Steri-Strips and a dry dressing were applied.  At the end of the case, all needle and sponge counts were correct. There were no adverse intraoperative events.  First assistant was [employer], my PA.     Tejal Monroy D. Rolena Infante, M.D.     DDB/MEDQ  D:  10/30/2016  T:  10/31/2016  Job:  [id number]  cc:   Melina Schools, MD's Office

## 2016-10-31 NOTE — Evaluation (Signed)
Physical Therapy Evaluation Patient Details Name: Anna Stone MRN: [id number] DOB: 1941/01/02 Today's Date: 10/31/2016   History of Present Illness  Pt is a 75 y.o. female s/p RIGHT L5-S1 DECOMPRESSION, EXCISION OF CYST. PMHx: DM2, CKD, Sleep apnea.   Clinical Impression  Patient evaluated by Physical Therapy with no further acute PT needs identified. All education has been completed and the patient has no further questions. At the time of PT eval, pt was able to perform transfers and ambulation with gross supervision for safety and HHA for stair negotiation. See below for any follow-up Physical Therapy or equipment needs. PT is signing off. Thank you for this referral.   Follow Up Recommendations Outpatient PT;Supervision - Intermittent    Equipment Recommendations  None recommended by PT    Recommendations for Other Services       Precautions / Restrictions Precautions Precautions: Fall;Back Precaution Booklet Issued: Yes (comment) Precaution Comments: Educated pt on back precautions. Required Braces or Orthoses: Spinal Brace Spinal Brace: Lumbar corset Restrictions Weight Bearing Restrictions: No      Mobility  Bed Mobility Overal bed mobility: Needs Assistance Bed Mobility: Rolling;Sidelying to Sit Rolling: Supervision Sidelying to sit: Supervision       General bed mobility comments: Pt sitting up in recliner chair upon PT arrival.   Transfers Overall transfer level: Needs assistance Equipment used: Rolling walker (2 wheeled) Transfers: Sit to/from Stand Sit to Stand: Supervision         General transfer comment: Supervision for safety. Good hand placement and technique with RW.  Ambulation/Gait Ambulation/Gait assistance: Supervision Ambulation Distance (Feet): 275 Feet Assistive device: Rolling walker (2 wheeled) Gait Pattern/deviations: Step-through pattern;Decreased stride length Gait velocity: Decreased Gait velocity interpretation: Below normal  speed for age/gender General Gait Details: Slow but steady gait with the RW for support. Pt was cued for improved posture and was able to maintain corrective changes  Stairs Stairs: Yes Stairs assistance: Min assist Stair Management: One rail Left;Step to pattern;Forwards Number of Stairs: 6 General stair comments: VC's for sequencing and technique. HHA provided for support however pt did not require.   Wheelchair Mobility    Modified Rankin (Stroke Patients Only)       Balance Overall balance assessment: Needs assistance Sitting-balance support: Feet supported;No upper extremity supported Sitting balance-Leahy Scale: Good     Standing balance support: No upper extremity supported;During functional activity Standing balance-Leahy Scale: Good                               Pertinent Vitals/Pain Pain Assessment: Faces Faces Pain Scale: Hurts little more Pain Location: Back Pain Descriptors / Indicators: Operative site guarding;Discomfort Pain Intervention(s): Limited activity within patient's tolerance;Monitored during session;Repositioned    Home Living Family/patient expects to be discharged to:: Private residence Living Arrangements: Spouse/significant other Available Help at Discharge: Family;Available 24 hours/day Type of Home: House Home Access: Stairs to enter Entrance Stairs-Rails: Psychiatric nurse of Steps: 6 Home Layout: Two level;Able to live on main level with bedroom/bathroom Home Equipment: Shower seat - built in      Prior Function Level of Independence: Independent               Hand Dominance   Dominant Hand: Right    Extremity/Trunk Assessment   Upper Extremity Assessment: Defer to OT evaluation           Lower Extremity Assessment: Generalized weakness;LLE deficits/detail   LLE Deficits / Details: Refers  to L knee as her "bad knee" and states she needs a replacement.   Cervical / Trunk Assessment:  Other exceptions  Communication   Communication: No difficulties  Cognition Arousal/Alertness: Awake/alert Behavior During Therapy: WFL for tasks assessed/performed Overall Cognitive Status: Within Functional Limits for tasks assessed                      General Comments      Exercises     Assessment/Plan    PT Assessment Patent does not need any further PT services  PT Problem List            PT Treatment Interventions      PT Goals (Current goals can be found in the Care Plan section)  Acute Rehab PT Goals Patient Stated Goal: return home PT Goal Formulation: All assessment and education complete, DC therapy    Frequency     Barriers to discharge        Co-evaluation               End of Session Equipment Utilized During Treatment: Back brace Activity Tolerance: Patient tolerated treatment well Patient left: in chair;with call bell/phone within reach Nurse Communication: Mobility status         Time: [id number] PT Time Calculation (min) (ACUTE ONLY): 18 min   Charges:   PT Evaluation $PT Eval Moderate Complexity: 1 Procedure     PT G CodesThelma Comp 11-05-16, 11:28 AM   Rolinda Roan, PT, DPT Acute Rehabilitation Services Pager: [phone]

## 2016-10-31 NOTE — Progress Notes (Signed)
    Subjective: 1 Day Post-Op Procedure(s) (LRB): RIGHT L5-S1 DECOMPRESSION, EXCISION OF CYST LEVEL 1 (Right) Patient reports pain as 2 on 0-10 scale.   Denies CP or SOB.  Voiding without difficulty. Positive flatus.  Denies leg pain.  Pt has been ambulating in hall.  Pt has walker at home and a shower chair Objective: Vital signs in last 24 hours: Temp:  [97.5 F (36.4 C)-98 F (36.7 C)] 97.8 F (36.6 C) (11/16 0400) Pulse Rate:  [79-96] 96 (11/16 0400) Resp:  [18-20] 20 (11/16 0400) BP: (115-164)/(46-72) 128/66 (11/16 0400) SpO2:  [96 %-100 %] 98 % (11/16 0400) Weight:  [69.4 kg (153 lb)] 69.4 kg (153 lb) (11/15 1139)  Intake/Output from previous day: 11/15 0701 - 11/16 0700 In: 1680 [P.O.:480; I.V.:1200] Out: 180 [Urine:150; Blood:30] Intake/Output this shift: No intake/output data recorded.  Labs: No results for input(s): HGB in the last 72 hours. No results for input(s): WBC, RBC, HCT, PLT in the last 72 hours. No results for input(s): NA, K, CL, CO2, BUN, CREATININE, GLUCOSE, CALCIUM in the last 72 hours. No results for input(s): LABPT, INR in the last 72 hours.  Physical Exam: Neurologically intact ABD soft Sensation intact distally Dorsiflexion/Plantar flexion intact Incision: no drainage Compartment soft  Assessment/Plan: 1 Day Post-Op Procedure(s) (LRB): RIGHT L5-S1 DECOMPRESSION, EXCISION OF CYST LEVEL 1 (Right) Advance diet Up with therapy Discharge home with home health  Pt may DC home after cleared by PT Post op medications provided She will present to clinic in 2 weeks  Zulema [place], Darla Lesches for Dr. Melina Schools [facility] [phone] 10/31/2016, 7:16 AM    Patient ID: Anna Stone, female   DOB: [birth year], 75 y.o.   MRN: [id number]

## 2016-10-31 NOTE — Progress Notes (Signed)
Pt doing well. Pt and husband given D/C instructions with Rx's, verbal understanding was provided. Pt's IV was removed prior to D/C. Pt's incision is clean and dry with no sign of infection. Pt D/C'd home via wheelchair @ 1030 per MD order. Pt is stable @ D/C and has no other needs at this time. Holli Humbles, RN

## 2016-10-31 NOTE — Evaluation (Signed)
Occupational Therapy Evaluation and Discharge Patient Details Name: Anna Stone MRN: [id number] DOB: [birth year] Today's Date: 10/31/2016    History of Present Illness Pt is a 75 y.o. female s/p RIGHT L5-S1 DECOMPRESSION, EXCISION OF CYST. PMHx: DM2, CKD, Sleep apnea.    Clinical Impression   Pt reports she was independent with ADL PTA. Currently pt overall supervision for ADL and functional mobility. All back, safety, and ADL education completed with pt. Pt planning to d/c home with 24/7 supervision from family. No further acute OT needs identified; signing off at this time. Please re-consult if needs change. Thank you for this referral.    Follow Up Recommendations  No OT follow up;Supervision/Assistance - 24 hour (initially)    Equipment Recommendations  None recommended by OT    Recommendations for Other Services PT consult     Precautions / Restrictions Precautions Precautions: Fall;Back Precaution Booklet Issued: Yes (comment) Precaution Comments: Educated pt on back precautions. Required Braces or Orthoses: Spinal Brace Spinal Brace: Lumbar corset Restrictions Weight Bearing Restrictions: No      Mobility Bed Mobility Overal bed mobility: Needs Assistance Bed Mobility: Rolling;Sidelying to Sit Rolling: Supervision Sidelying to sit: Supervision       General bed mobility comments: Supervision for safety. Cues for log roll technique. Use of rails.  Transfers Overall transfer level: Needs assistance Equipment used: Rolling walker (2 wheeled) Transfers: Sit to/from Stand Sit to Stand: Supervision         General transfer comment: Supervision for safety. Pt reports hx of vertigo; educated on safety with positional changes due to vertigo. Good hand placement and technique with RW.    Balance Overall balance assessment: Needs assistance Sitting-balance support: Feet supported;No upper extremity supported Sitting balance-Leahy Scale: Good      Standing balance support: No upper extremity supported;During functional activity Standing balance-Leahy Scale: Good                              ADL Overall ADL's : Needs assistance/impaired Eating/Feeding: Independent;Sitting   Grooming: Supervision/safety;Standing;Wash/dry hands Grooming Details (indicate cue type and reason): Educated pt on use of 2 cups for oral care. Upper Body Bathing: Set up;Supervision/ safety;Sitting   Lower Body Bathing: Set up;Supervison/ safety;Sit to/from stand   Upper Body Dressing : Set up;Supervision/safety;Sitting Upper Body Dressing Details (indicate cue type and reason): to don back brace. Cues for proper placement of brace and wear schedule. Lower Body Dressing: Minimal assistance;Sit to/from stand Lower Body Dressing Details (indicate cue type and reason): Educated pt on compensatory strategies for LB ADL. Pt reports husband to assist as needed. Toilet Transfer: Supervision/safety;Ambulation;BSC;RW;Grab bars Toilet Transfer Details (indicate cue type and reason): Only holding rail on R side to simulate home environment [place] and Hygiene: Supervision/safety;Sit to/from stand Toileting - Clothing Manipulation Details (indicate cue type and reason): Educated on peri care technique to avoid twisting and use of wet wipes. Tub/ Shower Transfer: Supervision/safety;Walk-in shower;Ambulation;Shower Technical sales engineer Details (indicate cue type and reason): Educated on supervision for safety with shower transfer upon return home and using seat in shower for safety. Functional mobility during ADLs: Supervision/safety;Rolling walker General ADL Comments: Educated pt on maintaining back precautions during functional activities, keeping frequently used items at counter top height, log roll technique for bed mobility, frequent mobility throughout the day upon return home.     Vision Vision Assessment?:  No apparent visual deficits   Perception     Praxis  Pertinent Vitals/Pain Pain Assessment: Faces Faces Pain Scale: Hurts little more Pain Location: back with mobility Pain Descriptors / Indicators: Sore Pain Intervention(s): Monitored during session;Repositioned     Hand Dominance Right   Extremity/Trunk Assessment Upper Extremity Assessment Upper Extremity Assessment: Overall WFL for tasks assessed   Lower Extremity Assessment Lower Extremity Assessment: Defer to PT evaluation   Cervical / Trunk Assessment Cervical / Trunk Assessment: Other exceptions Cervical / Trunk Exceptions: s/p spinal sx   Communication Communication Communication: No difficulties   Cognition Arousal/Alertness: Awake/alert Behavior During Therapy: WFL for tasks assessed/performed Overall Cognitive Status: Within Functional Limits for tasks assessed                     General Comments       Exercises       Shoulder Instructions      Home Living Family/patient expects to be discharged to:: Private residence Living Arrangements: Spouse/significant other Available Help at Discharge: Family;Available 24 hours/day Type of Home: House Home Access: Stairs to enter [employer] of Steps: 6 Entrance Stairs-Rails: Right;Left Home Layout: Two level;Able to live on main level with bedroom/bathroom     Bathroom Shower/Tub: Occupational psychologist: Handicapped height Bathroom Accessibility: Yes How Accessible: Accessible via wheelchair Home Equipment: [place] - built in          Prior Functioning/Environment Level of Independence: Independent                 OT Problem List:     OT Treatment/Interventions:      OT Goals(Current goals can be found in the care plan section) Acute Rehab OT Goals Patient Stated Goal: return home OT Goal Formulation: All assessment and education complete, DC therapy  OT Frequency:     Barriers to D/C:             Co-evaluation              End of Session Equipment Utilized During Treatment: Rolling walker;Back brace Nurse Communication: Mobility status;Other (comment) (no equipment of f/u needs)  Activity Tolerance: Patient tolerated treatment well Patient left: in chair;with call bell/phone within reach   Time: 0740-0800 OT Time Calculation (min): 20 min Charges:  OT General Charges $OT Visit: 1 Procedure OT Evaluation $OT Eval Moderate Complexity: 1 Procedure G-Codes:     Binnie Kand M.S., OTR/L Pager: [phone]  10/31/2016, 8:09 AM

## 2016-11-01 NOTE — Addendum Note (Signed)
Addendum  created 11/01/16 0736 by Jenne Campus, CRNA   Anesthesia Staff edited

## 2016-11-05 NOTE — Discharge Summary (Signed)
Physician Discharge Summary  Patient ID: Anna Stone MRN: [id number] DOB/AGE: [birth year] 77 y.o.  Admit date: 10/30/2016 Discharge date: 11/05/2016  Admission Diagnoses:  Lumbar DDD and Facet cyst  Discharge Diagnoses:  Active Problems:   Synovial cyst   Past Medical History:  Diagnosis Date  . Cancer ([place])   . Chronic kidney disease    stage 3  . Diabetes mellitus without complication ([place])   . Difficulty breathing   . GERD (gastroesophageal reflux disease)   . Hypertension   . IBS (irritable bowel syndrome)   . Muscle pain   . Palpitation   . PONV (postoperative nausea and vomiting)   . Swelling     Surgeries: Procedure(s): RIGHT L5-S1 DECOMPRESSION, EXCISION OF CYST LEVEL 1 on 10/30/2016   Consultants (if any):   Discharged Condition: Improved  Hospital Course: Anna Stone is an 74 y.o. female who was admitted 10/30/2016 with a diagnosis of Lumbar DDD and Facet cyst and went to the operating room on 10/30/2016 and underwent the above named procedures.  Pt reports incisional pain well controlled on oral medication.  Pt denies leg pain.  Pt has been ambulating in the hall.  Pt is voiding w/o difficulty. Pt is cleared by PT for DC.   She was given perioperative antibiotics:  Anti-infectives    Start     Dose/Rate Route Frequency Ordered Stop   10/30/16 2000  ceFAZolin (ANCEF) IVPB 1 g/50 mL premix     1 g 100 mL/hr over 30 Minutes Intravenous Every 8 hours 10/30/16 1620 10/31/16 0416   10/30/16 0600  ceFAZolin (ANCEF) IVPB 2g/100 mL premix     2 g 200 mL/hr over 30 Minutes Intravenous To Short Stay 10/29/16 1036 10/30/16 1254    .  She was given sequential compression devices, early ambulation, and TED for DVT prophylaxis.  She benefited maximally from the hospital stay and there were no complications.    Recent vital signs:  Vitals:   10/31/16 0400 10/31/16 0806  BP: 128/66 (!) 118/51  Pulse: 96 94  Resp: 20 18  Temp: 97.8 F (36.6 C) 98.6 F  (37 C)    Recent laboratory studies:  Lab Results  Component Value Date   HGB 11.9 (L) 10/23/2016   Lab Results  Component Value Date   WBC 5.5 10/23/2016   PLT 175 10/23/2016   No results found for: INR Lab Results  Component Value Date   NA 140 10/23/2016   K 4.0 10/23/2016   CL 103 10/23/2016   CO2 29 10/23/2016   BUN 30 (H) 10/23/2016   CREATININE 1.37 (H) 10/23/2016   GLUCOSE 91 10/23/2016    Discharge Medications:     Medication List    STOP taking these medications   anastrozole 1 MG tablet Commonly known as:  ARIMIDEX   aspirin 81 MG tablet   atorvastatin 20 MG tablet Commonly known as:  LIPITOR   gabapentin 600 MG tablet Commonly known as:  NEURONTIN   spironolactone 25 MG tablet Commonly known as:  ALDACTONE     TAKE these medications   furosemide 20 MG tablet Commonly known as:  LASIX Take 20 mg by mouth daily.   glyBURIDE 2.5 MG tablet Commonly known as:  DIABETA Take 2.5 mg by mouth daily with breakfast.   lisinopril-hydrochlorothiazide 20-12.5 MG tablet Commonly known as:  PRINZIDE,ZESTORETIC Take 1 tablet by mouth daily.   methocarbamol 500 MG tablet Commonly known as:  ROBAXIN Take 1 tablet (500 mg total) by  mouth 3 (three) times daily as needed for muscle spasms.   omeprazole 20 MG capsule Commonly known as:  PRILOSEC Take 20 mg by mouth daily.   ondansetron 4 MG tablet Commonly known as:  ZOFRAN Take 1 tablet (4 mg total) by mouth every 8 (eight) hours as needed for nausea or vomiting.   oxyCODONE-acetaminophen 10-325 MG tablet Commonly known as:  PERCOCET Take 1 tablet by mouth every 4 (four) hours as needed for pain.   REFRESH PLUS OP Place 1 drop into both eyes 2 (two) times daily as needed.   rOPINIRole 2 MG tablet Commonly known as:  REQUIP Take 2 mg by mouth at bedtime.   rosuvastatin 5 MG tablet Commonly known as:  CRESTOR Take 5 mg by mouth every other day.       Diagnostic Studies: Dg Lumbar Spine 2-3  Views  Result Date: 10/30/2016 CLINICAL DATA:  L5-S1 decompression. Intraoperative localization films. EXAM: LUMBAR SPINE - 2-3 VIEW COMPARISON:  MRI lumbar spine 05/28/2016. FINDINGS: Two lateral views of the lumbar spine are provided. On the first image, 2 probes are seen with 1 at the level of the L5 pedicles and directed toward the L5-S1 interspace. On the second image, probe is at the L5-S1 level deep to the L5 lamina. IMPRESSION: L5-S1 localization. Electronically Signed   By: Inge Rise M.D.   On: 10/30/2016 13:37    Disposition: 01-Home or Self Care Pt provided post op medications Pt will present to clinic in 2 weeks   Follow-up Information    BROOKS,DAHARI D, MD In 2 weeks.   Specialty:  Orthopedic Surgery Why:  If symptoms worsen, For suture removal, For wound re-check Contact information: [address] [place] [address] [id number]            Signed: Valinda Hoar 11/05/2016, 12:00 PM

## 2016-11-12 DIAGNOSIS — Z4789 Encounter for other orthopedic aftercare: Secondary | ICD-10-CM | POA: Diagnosis not present

## 2016-11-28 DIAGNOSIS — E119 Type 2 diabetes mellitus without complications: Secondary | ICD-10-CM | POA: Diagnosis not present

## 2016-11-28 DIAGNOSIS — E1122 Type 2 diabetes mellitus with diabetic chronic kidney disease: Secondary | ICD-10-CM | POA: Diagnosis not present

## 2016-11-28 DIAGNOSIS — E1169 Type 2 diabetes mellitus with other specified complication: Secondary | ICD-10-CM | POA: Diagnosis not present

## 2016-12-11 DIAGNOSIS — J069 Acute upper respiratory infection, unspecified: Secondary | ICD-10-CM | POA: Diagnosis not present

## 2016-12-11 DIAGNOSIS — R0602 Shortness of breath: Secondary | ICD-10-CM | POA: Diagnosis not present

## 2016-12-11 DIAGNOSIS — J111 Influenza due to unidentified influenza virus with other respiratory manifestations: Secondary | ICD-10-CM | POA: Diagnosis not present

## 2016-12-11 DIAGNOSIS — R0600 Dyspnea, unspecified: Secondary | ICD-10-CM | POA: Diagnosis not present

## 2016-12-13 DIAGNOSIS — Z4789 Encounter for other orthopedic aftercare: Secondary | ICD-10-CM | POA: Diagnosis not present

## 2016-12-13 DIAGNOSIS — M48062 Spinal stenosis, lumbar region with neurogenic claudication: Secondary | ICD-10-CM | POA: Diagnosis not present

## 2016-12-25 DIAGNOSIS — E785 Hyperlipidemia, unspecified: Secondary | ICD-10-CM | POA: Diagnosis not present

## 2016-12-25 DIAGNOSIS — E1169 Type 2 diabetes mellitus with other specified complication: Secondary | ICD-10-CM | POA: Diagnosis not present

## 2016-12-25 DIAGNOSIS — I129 Hypertensive chronic kidney disease with stage 1 through stage 4 chronic kidney disease, or unspecified chronic kidney disease: Secondary | ICD-10-CM | POA: Diagnosis not present

## 2016-12-25 DIAGNOSIS — E119 Type 2 diabetes mellitus without complications: Secondary | ICD-10-CM | POA: Diagnosis not present

## 2017-01-06 DIAGNOSIS — H35373 Puckering of macula, bilateral: Secondary | ICD-10-CM | POA: Diagnosis not present

## 2017-03-25 DIAGNOSIS — M7542 Impingement syndrome of left shoulder: Secondary | ICD-10-CM | POA: Diagnosis not present

## 2017-04-24 DIAGNOSIS — E119 Type 2 diabetes mellitus without complications: Secondary | ICD-10-CM | POA: Diagnosis not present

## 2017-04-24 DIAGNOSIS — E1169 Type 2 diabetes mellitus with other specified complication: Secondary | ICD-10-CM | POA: Diagnosis not present

## 2017-04-24 DIAGNOSIS — E1122 Type 2 diabetes mellitus with diabetic chronic kidney disease: Secondary | ICD-10-CM | POA: Diagnosis not present

## 2017-05-06 DIAGNOSIS — I129 Hypertensive chronic kidney disease with stage 1 through stage 4 chronic kidney disease, or unspecified chronic kidney disease: Secondary | ICD-10-CM | POA: Diagnosis not present

## 2017-05-06 DIAGNOSIS — E1122 Type 2 diabetes mellitus with diabetic chronic kidney disease: Secondary | ICD-10-CM | POA: Diagnosis not present

## 2017-05-06 DIAGNOSIS — E785 Hyperlipidemia, unspecified: Secondary | ICD-10-CM | POA: Diagnosis not present

## 2017-05-06 DIAGNOSIS — E1169 Type 2 diabetes mellitus with other specified complication: Secondary | ICD-10-CM | POA: Diagnosis not present

## 2017-05-09 DIAGNOSIS — M170 Bilateral primary osteoarthritis of knee: Secondary | ICD-10-CM | POA: Diagnosis not present

## 2017-05-22 DIAGNOSIS — M170 Bilateral primary osteoarthritis of knee: Secondary | ICD-10-CM | POA: Diagnosis not present

## 2017-05-28 DIAGNOSIS — Z139 Encounter for screening, unspecified: Secondary | ICD-10-CM | POA: Diagnosis not present

## 2017-05-28 DIAGNOSIS — Z0000 Encounter for general adult medical examination without abnormal findings: Secondary | ICD-10-CM | POA: Diagnosis not present

## 2017-05-28 DIAGNOSIS — Z1389 Encounter for screening for other disorder: Secondary | ICD-10-CM | POA: Diagnosis not present

## 2017-08-16 DIAGNOSIS — Z79899 Other long term (current) drug therapy: Secondary | ICD-10-CM | POA: Diagnosis not present

## 2017-08-16 DIAGNOSIS — Z7982 Long term (current) use of aspirin: Secondary | ICD-10-CM | POA: Diagnosis not present

## 2017-08-16 DIAGNOSIS — L03116 Cellulitis of left lower limb: Secondary | ICD-10-CM | POA: Diagnosis not present

## 2017-08-16 DIAGNOSIS — L0390 Cellulitis, unspecified: Secondary | ICD-10-CM | POA: Diagnosis not present

## 2017-08-16 DIAGNOSIS — S80812A Abrasion, left lower leg, initial encounter: Secondary | ICD-10-CM | POA: Diagnosis not present

## 2017-08-20 DIAGNOSIS — Z6829 Body mass index (BMI) 29.0-29.9, adult: Secondary | ICD-10-CM | POA: Diagnosis not present

## 2017-08-20 DIAGNOSIS — Z7189 Other specified counseling: Secondary | ICD-10-CM | POA: Diagnosis not present

## 2017-08-20 DIAGNOSIS — S81802A Unspecified open wound, left lower leg, initial encounter: Secondary | ICD-10-CM | POA: Diagnosis not present

## 2017-08-20 DIAGNOSIS — L03119 Cellulitis of unspecified part of limb: Secondary | ICD-10-CM | POA: Diagnosis not present

## 2017-08-20 DIAGNOSIS — Z23 Encounter for immunization: Secondary | ICD-10-CM | POA: Diagnosis not present

## 2017-09-04 DIAGNOSIS — E1369 Other specified diabetes mellitus with other specified complication: Secondary | ICD-10-CM | POA: Diagnosis not present

## 2017-09-04 DIAGNOSIS — E1122 Type 2 diabetes mellitus with diabetic chronic kidney disease: Secondary | ICD-10-CM | POA: Diagnosis not present

## 2017-09-04 DIAGNOSIS — L03119 Cellulitis of unspecified part of limb: Secondary | ICD-10-CM | POA: Diagnosis not present

## 2017-09-04 DIAGNOSIS — E1169 Type 2 diabetes mellitus with other specified complication: Secondary | ICD-10-CM | POA: Diagnosis not present

## 2017-09-10 DIAGNOSIS — C50411 Malignant neoplasm of upper-outer quadrant of right female breast: Secondary | ICD-10-CM | POA: Diagnosis not present

## 2017-09-10 DIAGNOSIS — R922 Inconclusive mammogram: Secondary | ICD-10-CM | POA: Diagnosis not present

## 2017-09-11 DIAGNOSIS — Z139 Encounter for screening, unspecified: Secondary | ICD-10-CM | POA: Diagnosis not present

## 2017-09-11 DIAGNOSIS — I208 Other forms of angina pectoris: Secondary | ICD-10-CM | POA: Diagnosis not present

## 2017-09-11 DIAGNOSIS — E785 Hyperlipidemia, unspecified: Secondary | ICD-10-CM | POA: Diagnosis not present

## 2017-09-11 DIAGNOSIS — N183 Chronic kidney disease, stage 3 (moderate): Secondary | ICD-10-CM | POA: Diagnosis not present

## 2017-09-11 DIAGNOSIS — I129 Hypertensive chronic kidney disease with stage 1 through stage 4 chronic kidney disease, or unspecified chronic kidney disease: Secondary | ICD-10-CM | POA: Diagnosis not present

## 2017-09-11 DIAGNOSIS — E1122 Type 2 diabetes mellitus with diabetic chronic kidney disease: Secondary | ICD-10-CM | POA: Diagnosis not present

## 2017-09-11 DIAGNOSIS — Z23 Encounter for immunization: Secondary | ICD-10-CM | POA: Diagnosis not present

## 2017-09-11 DIAGNOSIS — M79609 Pain in unspecified limb: Secondary | ICD-10-CM | POA: Diagnosis not present

## 2017-09-11 DIAGNOSIS — E1169 Type 2 diabetes mellitus with other specified complication: Secondary | ICD-10-CM | POA: Diagnosis not present

## 2017-09-11 DIAGNOSIS — E1369 Other specified diabetes mellitus with other specified complication: Secondary | ICD-10-CM | POA: Diagnosis not present

## 2017-09-11 DIAGNOSIS — I10 Essential (primary) hypertension: Secondary | ICD-10-CM | POA: Diagnosis not present

## 2017-09-16 DIAGNOSIS — Z6828 Body mass index (BMI) 28.0-28.9, adult: Secondary | ICD-10-CM | POA: Diagnosis not present

## 2017-09-16 DIAGNOSIS — R112 Nausea with vomiting, unspecified: Secondary | ICD-10-CM | POA: Diagnosis not present

## 2017-09-16 DIAGNOSIS — M2550 Pain in unspecified joint: Secondary | ICD-10-CM | POA: Diagnosis not present

## 2017-10-06 DIAGNOSIS — H35373 Puckering of macula, bilateral: Secondary | ICD-10-CM | POA: Diagnosis not present

## 2017-10-23 DIAGNOSIS — Z853 Personal history of malignant neoplasm of breast: Secondary | ICD-10-CM | POA: Diagnosis not present

## 2017-10-23 DIAGNOSIS — Z79811 Long term (current) use of aromatase inhibitors: Secondary | ICD-10-CM | POA: Diagnosis not present

## 2017-12-23 DIAGNOSIS — E1169 Type 2 diabetes mellitus with other specified complication: Secondary | ICD-10-CM | POA: Diagnosis not present

## 2017-12-23 DIAGNOSIS — E1122 Type 2 diabetes mellitus with diabetic chronic kidney disease: Secondary | ICD-10-CM | POA: Diagnosis not present

## 2017-12-23 DIAGNOSIS — E1369 Other specified diabetes mellitus with other specified complication: Secondary | ICD-10-CM | POA: Diagnosis not present

## 2017-12-26 DIAGNOSIS — T783XXA Angioneurotic edema, initial encounter: Secondary | ICD-10-CM | POA: Diagnosis not present

## 2017-12-26 DIAGNOSIS — R0603 Acute respiratory distress: Secondary | ICD-10-CM | POA: Diagnosis not present

## 2017-12-26 DIAGNOSIS — T464X5A Adverse effect of angiotensin-converting-enzyme inhibitors, initial encounter: Secondary | ICD-10-CM | POA: Diagnosis not present

## 2017-12-30 DIAGNOSIS — I129 Hypertensive chronic kidney disease with stage 1 through stage 4 chronic kidney disease, or unspecified chronic kidney disease: Secondary | ICD-10-CM | POA: Diagnosis not present

## 2017-12-30 DIAGNOSIS — Z6829 Body mass index (BMI) 29.0-29.9, adult: Secondary | ICD-10-CM | POA: Diagnosis not present

## 2017-12-30 DIAGNOSIS — E1122 Type 2 diabetes mellitus with diabetic chronic kidney disease: Secondary | ICD-10-CM | POA: Diagnosis not present

## 2017-12-30 DIAGNOSIS — N183 Chronic kidney disease, stage 3 (moderate): Secondary | ICD-10-CM | POA: Diagnosis not present

## 2018-01-08 DIAGNOSIS — T783XXA Angioneurotic edema, initial encounter: Secondary | ICD-10-CM | POA: Diagnosis not present

## 2018-01-08 DIAGNOSIS — E1122 Type 2 diabetes mellitus with diabetic chronic kidney disease: Secondary | ICD-10-CM | POA: Diagnosis not present

## 2018-01-08 DIAGNOSIS — T7840XS Allergy, unspecified, sequela: Secondary | ICD-10-CM | POA: Diagnosis not present

## 2018-01-08 DIAGNOSIS — I129 Hypertensive chronic kidney disease with stage 1 through stage 4 chronic kidney disease, or unspecified chronic kidney disease: Secondary | ICD-10-CM | POA: Diagnosis not present

## 2018-01-08 DIAGNOSIS — J3089 Other allergic rhinitis: Secondary | ICD-10-CM | POA: Diagnosis not present

## 2018-01-09 DIAGNOSIS — J3089 Other allergic rhinitis: Secondary | ICD-10-CM | POA: Diagnosis not present

## 2018-01-12 DIAGNOSIS — J3089 Other allergic rhinitis: Secondary | ICD-10-CM | POA: Diagnosis not present

## 2018-01-13 DIAGNOSIS — J3089 Other allergic rhinitis: Secondary | ICD-10-CM | POA: Diagnosis not present

## 2018-01-14 DIAGNOSIS — J3089 Other allergic rhinitis: Secondary | ICD-10-CM | POA: Diagnosis not present

## 2018-01-15 DIAGNOSIS — J3089 Other allergic rhinitis: Secondary | ICD-10-CM | POA: Diagnosis not present

## 2018-01-16 DIAGNOSIS — J3089 Other allergic rhinitis: Secondary | ICD-10-CM | POA: Diagnosis not present

## 2018-01-19 DIAGNOSIS — J3089 Other allergic rhinitis: Secondary | ICD-10-CM | POA: Diagnosis not present

## 2018-01-22 ENCOUNTER — Ambulatory Visit (INDEPENDENT_AMBULATORY_CARE_PROVIDER_SITE_OTHER): Payer: Medicare Other | Admitting: Sports Medicine

## 2018-01-22 ENCOUNTER — Encounter: Payer: Self-pay | Admitting: Sports Medicine

## 2018-01-22 VITALS — BP 127/63 | HR 82 | Ht 62.0 in | Wt 165.0 lb

## 2018-01-22 DIAGNOSIS — I739 Peripheral vascular disease, unspecified: Secondary | ICD-10-CM

## 2018-01-22 DIAGNOSIS — Z6830 Body mass index (BMI) 30.0-30.9, adult: Secondary | ICD-10-CM | POA: Diagnosis not present

## 2018-01-22 DIAGNOSIS — N183 Chronic kidney disease, stage 3 (moderate): Secondary | ICD-10-CM | POA: Diagnosis not present

## 2018-01-22 DIAGNOSIS — M2042 Other hammer toe(s) (acquired), left foot: Secondary | ICD-10-CM

## 2018-01-22 DIAGNOSIS — M21962 Unspecified acquired deformity of left lower leg: Secondary | ICD-10-CM

## 2018-01-22 DIAGNOSIS — E1122 Type 2 diabetes mellitus with diabetic chronic kidney disease: Secondary | ICD-10-CM | POA: Diagnosis not present

## 2018-01-22 DIAGNOSIS — M79674 Pain in right toe(s): Secondary | ICD-10-CM | POA: Diagnosis not present

## 2018-01-22 DIAGNOSIS — I129 Hypertensive chronic kidney disease with stage 1 through stage 4 chronic kidney disease, or unspecified chronic kidney disease: Secondary | ICD-10-CM | POA: Diagnosis not present

## 2018-01-22 DIAGNOSIS — B351 Tinea unguium: Secondary | ICD-10-CM

## 2018-01-22 DIAGNOSIS — M2041 Other hammer toe(s) (acquired), right foot: Secondary | ICD-10-CM | POA: Diagnosis not present

## 2018-01-22 DIAGNOSIS — M79675 Pain in left toe(s): Secondary | ICD-10-CM | POA: Diagnosis not present

## 2018-01-22 DIAGNOSIS — E1140 Type 2 diabetes mellitus with diabetic neuropathy, unspecified: Secondary | ICD-10-CM | POA: Diagnosis not present

## 2018-01-22 NOTE — Patient Instructions (Signed)

## 2018-01-22 NOTE — Progress Notes (Signed)
Subjective: Anna Stone is a 77 y.o. female patient with history of diabetes who presents to office today complaining of long,mildly painful nails  while ambulating in shoes; unable to trim. Patient states that the glucose reading this morning was 96mg /dl. Patient denies any new changes in medication or new problems. Patient was diagnosed 10 years ago with diabetes and admits to a history of neuropathy with constant numbness tingling burning to both feet and legs.  Patient reports a leg injury one year ago where a table fell on her left leg and took forever to heal denies any recent cuts or sores or wounds on both lower extremities.  Admits in the past she had diabetic shoes that worked well and desires a new set.  PCP Dr. Nyra Capes last seen today  Patient Active Problem List   Diagnosis Date Noted  . 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 amLODipine (NORVASC) 10 MG tablet Take 10 mg by mouth daily.    Marland Kitchen aspirin 325 MG tablet Take 325 mg by mouth daily.    . Carboxymethylcellulose Sodium (REFRESH PLUS OP) Place 1 drop into both eyes 2 (two) times daily as needed.    Marland Kitchen EPINEPHrine 0.3 mg/0.3 mL IJ SOAJ injection Inject into the muscle once.    . fluticasone (FLOVENT DISKUS) 50 MCG/BLIST diskus inhaler Inhale 1 puff into the lungs 2 (two) times daily.    Marland Kitchen gabapentin (NEURONTIN) 600 MG tablet Take by mouth.    . hydrochlorothiazide (HYDRODIURIL) 25 MG tablet Take 25 mg by mouth daily.    Marland Kitchen MAGNESIUM PO Take by mouth.    . Metoprolol Succinate 25 MG CS24 Take by mouth.    Marland Kitchen omeprazole (PRILOSEC) 20 MG capsule Take 20 mg by mouth daily.    . pioglitazone (ACTOS) 15 MG tablet Take by mouth.    . promethazine (PHENERGAN) 12.5 MG tablet Take 12.5 mg by mouth every 6 (six) hours as needed for nausea or vomiting.    Marland Kitchen rOPINIRole (REQUIP) 2 MG tablet Take 2 mg by mouth at bedtime.    . furosemide (LASIX) 20 MG tablet Take 20  mg by mouth daily.     Marland Kitchen glyBURIDE (DIABETA) 2.5 MG tablet Take 2.5 mg by mouth daily with breakfast.    . lisinopril-hydrochlorothiazide (PRINZIDE,ZESTORETIC) 20-12.5 MG per tablet Take 1 tablet by mouth daily.    . methocarbamol (ROBAXIN) 500 MG tablet Take 1 tablet (500 mg total) by mouth 3 (three) times daily as needed for muscle spasms. (Patient not taking: Reported on 01/22/2018) 60 tablet 0  . ondansetron (ZOFRAN) 4 MG tablet Take 1 tablet (4 mg total) by mouth every 8 (eight) hours as needed for nausea or vomiting. (Patient not taking: Reported on 01/22/2018) 20 tablet 0  . oxyCODONE-acetaminophen (PERCOCET) 10-325 MG tablet Take 1 tablet by mouth every 4 (four) hours as needed for pain. (Patient not taking: Reported on 01/22/2018) 60 tablet 0  . rosuvastatin (CRESTOR) 5 MG tablet Take 5 mg by mouth every other day.     No current facility-administered medications on file prior to visit.    Allergies  Allergen Reactions  . Sulfa Antibiotics Itching and Rash  . Adhesive [Tape] Dermatitis    Blisters the skin    No results found for this or any previous visit (from the past 2160 hour(s)).  Objective: General: Patient is awake, alert, and oriented x 3 and in no acute distress.  Integument: Skin is warm, dry and supple bilateral. Nails are tender, long, thickened and  dystrophic with subungual debris, consistent with onychomycosis, bilateral hallux nail all other nails are minimally thickened and discolored. No signs of infection. No open lesions or preulcerative lesions present bilateral. Remaining integument unremarkable.  Vasculature:  Dorsalis Pedis pulse 1/4 bilateral. Posterior Tibial pulse  0/4 bilateral.  Capillary fill time <3 sec 1-5 bilateral. Positive hair growth to the level of the digits. Temperature gradient decreased bilateral. + varicosities present bilateral.  Trace edema present bilateral.   Neurology: The patient has diminished sensation measured with a 5.07/10g Semmes  Weinstein Monofilament at all pedal sites bilateral . Vibratory sensation diminished bilateral with tuning fork. No Babinski sign present bilateral.    Musculoskeletal: Hammertoe pedal deformities noted bilateral and leg deformity from table fall noted on left. Muscular strength acceptable in all lower extremity muscular groups bilateral without pain on range of motion . No tenderness with calf compression bilateral.  Assessment and Plan: Problem List Items Addressed This Visit    None    Visit Diagnoses    Pain due to onychomycosis of toenails of both feet    -  Primary   Type 2 diabetes mellitus with diabetic neuropathy, unspecified whether long term insulin use ([facility])       Relevant Medications   pioglitazone (ACTOS) 15 MG tablet   aspirin 325 MG tablet   PVD (peripheral vascular disease) ([facility])       Relevant Medications   Metoprolol Succinate 25 MG CS24   EPINEPHrine 0.3 mg/0.3 mL IJ SOAJ injection   hydrochlorothiazide (HYDRODIURIL) 25 MG tablet   aspirin 325 MG tablet   amLODipine (NORVASC) 10 MG tablet   Hammer toes of both feet       Acquired deformity of left lower leg       Leg injury 1 year ago      -Examined patient. -Discussed and educated patient on diabetic foot care, especially with  regards to the vascular, neurological and musculoskeletal systems.  -Stressed the importance of good glycemic control and the detriment of not  controlling glucose levels in relation to the foot. -Mechanically debrided bilateral hallux nails and trimmed all remainder using sterile nail nipper and filed with dremel without incident  -Safe step diabetic shoe order form was completed; office to contact primary care for approval / certification;  Office to arrange shoe fitting and dispensing. -Answered all patient questions -Patient to return  in 3 months for at risk foot care -Patient advised to call the office if any problems or questions arise in the meantime.  Landis Martins, DPM

## 2018-02-23 DIAGNOSIS — I129 Hypertensive chronic kidney disease with stage 1 through stage 4 chronic kidney disease, or unspecified chronic kidney disease: Secondary | ICD-10-CM | POA: Diagnosis not present

## 2018-02-23 DIAGNOSIS — N183 Chronic kidney disease, stage 3 (moderate): Secondary | ICD-10-CM | POA: Diagnosis not present

## 2018-02-23 DIAGNOSIS — E1122 Type 2 diabetes mellitus with diabetic chronic kidney disease: Secondary | ICD-10-CM | POA: Diagnosis not present

## 2018-02-23 DIAGNOSIS — R609 Edema, unspecified: Secondary | ICD-10-CM | POA: Diagnosis not present

## 2018-02-23 DIAGNOSIS — E1169 Type 2 diabetes mellitus with other specified complication: Secondary | ICD-10-CM | POA: Diagnosis not present

## 2018-02-26 ENCOUNTER — Ambulatory Visit: Payer: Medicare Other | Admitting: *Deleted

## 2018-02-26 DIAGNOSIS — E1140 Type 2 diabetes mellitus with diabetic neuropathy, unspecified: Secondary | ICD-10-CM

## 2018-04-02 ENCOUNTER — Encounter: Payer: Self-pay | Admitting: Sports Medicine

## 2018-04-02 ENCOUNTER — Ambulatory Visit (INDEPENDENT_AMBULATORY_CARE_PROVIDER_SITE_OTHER): Payer: Medicare Other | Admitting: Sports Medicine

## 2018-04-02 DIAGNOSIS — B351 Tinea unguium: Secondary | ICD-10-CM

## 2018-04-02 DIAGNOSIS — M79674 Pain in right toe(s): Secondary | ICD-10-CM | POA: Diagnosis not present

## 2018-04-02 DIAGNOSIS — I739 Peripheral vascular disease, unspecified: Secondary | ICD-10-CM

## 2018-04-02 DIAGNOSIS — E1140 Type 2 diabetes mellitus with diabetic neuropathy, unspecified: Secondary | ICD-10-CM | POA: Diagnosis not present

## 2018-04-02 DIAGNOSIS — M79675 Pain in left toe(s): Secondary | ICD-10-CM | POA: Diagnosis not present

## 2018-04-02 NOTE — Progress Notes (Signed)
Subjective: Anna Stone is a 77 y.o. female patient with history of diabetes who presents to office today complaining of long,mildly painful nails  while ambulating in shoes; unable to trim. Patient states that the glucose reading this morning was 92mg /dl.  Saw Dr. Nyra Capes 1 month ago.  Does not know last A1c.  Patient denies any new changes in medication or new problems.    Patient Active Problem List   Diagnosis Date Noted  . 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 amLODipine (NORVASC) 10 MG tablet Take 10 mg by mouth daily.    Marland Kitchen aspirin 325 MG tablet Take 325 mg by mouth daily.    . Carboxymethylcellulose Sodium (REFRESH PLUS OP) Place 1 drop into both eyes 2 (two) times daily as needed.    Marland Kitchen EPINEPHrine 0.3 mg/0.3 mL IJ SOAJ injection Inject into the muscle once.    . fluticasone (FLOVENT DISKUS) 50 MCG/BLIST diskus inhaler Inhale 1 puff into the lungs 2 (two) times daily.    . furosemide (LASIX) 20 MG tablet Take 20 mg by mouth daily.     Marland Kitchen gabapentin (NEURONTIN) 600 MG tablet Take by mouth.    . glyBURIDE (DIABETA) 2.5 MG tablet Take 2.5 mg by mouth daily with breakfast.    . hydrochlorothiazide (HYDRODIURIL) 25 MG tablet Take 25 mg by mouth daily.    Marland Kitchen lisinopril-hydrochlorothiazide (PRINZIDE,ZESTORETIC) 20-12.5 MG per tablet Take 1 tablet by mouth daily.    Marland Kitchen MAGNESIUM PO Take by mouth.    . methocarbamol (ROBAXIN) 500 MG tablet Take 1 tablet (500 mg total) by mouth 3 (three) times daily as needed for muscle spasms. 60 tablet 0  . Metoprolol Succinate 25 MG CS24 Take by mouth.    Marland Kitchen omeprazole (PRILOSEC) 20 MG capsule Take 20 mg by mouth daily.    . ondansetron (ZOFRAN) 4 MG tablet Take 1 tablet (4 mg total) by mouth every 8 (eight) hours as needed for nausea or vomiting. 20 tablet 0  . oxyCODONE-acetaminophen (PERCOCET) 10-325 MG tablet Take 1 tablet by mouth every 4 (four) hours as needed for pain. 60  tablet 0  . pioglitazone (ACTOS) 15 MG tablet Take by mouth.    . promethazine (PHENERGAN) 12.5 MG tablet Take 12.5 mg by mouth every 6 (six) hours as needed for nausea or vomiting.    Marland Kitchen rOPINIRole (REQUIP) 2 MG tablet Take 2 mg by mouth at bedtime.    . rosuvastatin (CRESTOR) 5 MG tablet Take 5 mg by mouth every other day.     No current facility-administered medications on file prior to visit.    Allergies  Allergen Reactions  . Sulfa Antibiotics Itching and Rash  . Adhesive [Tape] Dermatitis    Blisters the skin    No results found for this or any previous visit (from the past 2160 hour(s)).  Objective: General: Patient is awake, alert, and oriented x 3 and in no acute distress.  Integument: Skin is warm, dry and supple bilateral. Nails are tender, long, thickened and  dystrophic with subungual debris, consistent with onychomycosis, bilateral hallux nail all other nails are minimally thickened and discolored. No signs of infection. No open lesions or preulcerative lesions present bilateral. Remaining integument unremarkable.  Vasculature:  Dorsalis Pedis pulse 1/4 bilateral. Posterior Tibial pulse  0/4 bilateral.  Capillary fill time <3 sec 1-5 bilateral. Positive hair growth to the level of the digits. Temperature gradient decreased bilateral. + varicosities  present bilateral.  Trace edema present bilateral.   Neurology: The patient has diminished sensation measured with a 5.07/10g Semmes Weinstein Monofilament at all pedal sites bilateral . Vibratory sensation diminished bilateral with tuning fork. No Babinski sign present bilateral.    Musculoskeletal: Hammertoe pedal deformities noted bilateral and leg deformity from table fall noted on left. Muscular strength acceptable in all lower extremity muscular groups bilateral without pain on range of motion . No tenderness with calf compression bilateral.  Assessment and Plan: Problem List Items Addressed This Visit    None     Visit Diagnoses    Pain due to onychomycosis of toenails of both feet    -  Primary   Type 2 diabetes mellitus with diabetic neuropathy, unspecified whether long term insulin use ([place])       PVD (peripheral vascular disease) ([place])          -Examined patient. -Discussed and educated patient on diabetic foot care, especially with  regards to the vascular, neurological and musculoskeletal systems.  -Stressed the importance of good glycemic control and the detriment of not  controlling glucose levels in relation to the foot. -Mechanically debrided bilateral hallux nails and trimmed all remainder using sterile nail nipper and filed with dremel without incident  -Patient to take safe step certification to her PCP, Dr. Nyra Capes for signature. -Answered all patient questions -Patient to return  in 3 months for at risk foot care -Patient advised to call the office if any problems or questions arise in the meantime.  Landis Martins, DPM

## 2018-05-14 DIAGNOSIS — M1712 Unilateral primary osteoarthritis, left knee: Secondary | ICD-10-CM | POA: Diagnosis not present

## 2018-05-14 DIAGNOSIS — G8929 Other chronic pain: Secondary | ICD-10-CM | POA: Diagnosis not present

## 2018-05-14 DIAGNOSIS — M25561 Pain in right knee: Secondary | ICD-10-CM | POA: Insufficient documentation

## 2018-05-14 DIAGNOSIS — M25562 Pain in left knee: Secondary | ICD-10-CM | POA: Diagnosis not present

## 2018-06-15 DIAGNOSIS — E1169 Type 2 diabetes mellitus with other specified complication: Secondary | ICD-10-CM | POA: Diagnosis not present

## 2018-06-15 DIAGNOSIS — E1122 Type 2 diabetes mellitus with diabetic chronic kidney disease: Secondary | ICD-10-CM | POA: Diagnosis not present

## 2018-06-26 DIAGNOSIS — E782 Mixed hyperlipidemia: Secondary | ICD-10-CM | POA: Diagnosis not present

## 2018-06-26 DIAGNOSIS — Z1331 Encounter for screening for depression: Secondary | ICD-10-CM | POA: Diagnosis not present

## 2018-06-26 DIAGNOSIS — E1122 Type 2 diabetes mellitus with diabetic chronic kidney disease: Secondary | ICD-10-CM | POA: Diagnosis not present

## 2018-06-26 DIAGNOSIS — Z0000 Encounter for general adult medical examination without abnormal findings: Secondary | ICD-10-CM | POA: Diagnosis not present

## 2018-06-26 DIAGNOSIS — I129 Hypertensive chronic kidney disease with stage 1 through stage 4 chronic kidney disease, or unspecified chronic kidney disease: Secondary | ICD-10-CM | POA: Diagnosis not present

## 2018-06-26 DIAGNOSIS — E1169 Type 2 diabetes mellitus with other specified complication: Secondary | ICD-10-CM | POA: Diagnosis not present

## 2018-06-26 DIAGNOSIS — Z139 Encounter for screening, unspecified: Secondary | ICD-10-CM | POA: Diagnosis not present

## 2018-06-26 DIAGNOSIS — Z6829 Body mass index (BMI) 29.0-29.9, adult: Secondary | ICD-10-CM | POA: Diagnosis not present

## 2018-07-02 ENCOUNTER — Ambulatory Visit: Payer: Medicare Other | Admitting: Sports Medicine

## 2018-07-09 ENCOUNTER — Ambulatory Visit: Payer: Medicare Other | Admitting: Sports Medicine

## 2018-07-15 ENCOUNTER — Encounter: Payer: Self-pay | Admitting: Sports Medicine

## 2018-07-15 ENCOUNTER — Ambulatory Visit (INDEPENDENT_AMBULATORY_CARE_PROVIDER_SITE_OTHER): Payer: Medicare Other | Admitting: Sports Medicine

## 2018-07-15 DIAGNOSIS — M79674 Pain in right toe(s): Secondary | ICD-10-CM

## 2018-07-15 DIAGNOSIS — M79675 Pain in left toe(s): Secondary | ICD-10-CM | POA: Diagnosis not present

## 2018-07-15 DIAGNOSIS — E1122 Type 2 diabetes mellitus with diabetic chronic kidney disease: Secondary | ICD-10-CM | POA: Diagnosis not present

## 2018-07-15 DIAGNOSIS — I739 Peripheral vascular disease, unspecified: Secondary | ICD-10-CM | POA: Diagnosis not present

## 2018-07-15 DIAGNOSIS — I129 Hypertensive chronic kidney disease with stage 1 through stage 4 chronic kidney disease, or unspecified chronic kidney disease: Secondary | ICD-10-CM | POA: Diagnosis not present

## 2018-07-15 DIAGNOSIS — E1140 Type 2 diabetes mellitus with diabetic neuropathy, unspecified: Secondary | ICD-10-CM

## 2018-07-15 DIAGNOSIS — E782 Mixed hyperlipidemia: Secondary | ICD-10-CM | POA: Diagnosis not present

## 2018-07-15 DIAGNOSIS — B351 Tinea unguium: Secondary | ICD-10-CM | POA: Diagnosis not present

## 2018-07-15 DIAGNOSIS — E1169 Type 2 diabetes mellitus with other specified complication: Secondary | ICD-10-CM | POA: Diagnosis not present

## 2018-07-15 NOTE — Patient Instructions (Signed)

## 2018-07-15 NOTE — Progress Notes (Signed)
Subjective: Anna Stone is a 77 y.o. female patient with history of diabetes who presents to office today complaining of long,mildly painful nails while ambulating in shoes; unable to trim and for pickup of diabetic shoes. Patient states that the glucose reading this morning was 112mg /dl.  Saw Dr. Nyra Capes 2 weeks ago.  Does not know last A1c.  Patient denies any new changes in medication or new problems.    Patient Active Problem List   Diagnosis Date Noted  . 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 amLODipine (NORVASC) 10 MG tablet Take 10 mg by mouth daily.    Marland Kitchen aspirin 325 MG tablet Take 325 mg by mouth daily.    . Carboxymethylcellulose Sodium (REFRESH PLUS OP) Place 1 drop into both eyes 2 (two) times daily as needed.    Marland Kitchen EPINEPHrine 0.3 mg/0.3 mL IJ SOAJ injection Inject into the muscle once.    . fluticasone (FLOVENT DISKUS) 50 MCG/BLIST diskus inhaler Inhale 1 puff into the lungs 2 (two) times daily.    . furosemide (LASIX) 20 MG tablet Take 20 mg by mouth daily.     Marland Kitchen gabapentin (NEURONTIN) 600 MG tablet Take by mouth.    . glyBURIDE (DIABETA) 2.5 MG tablet Take 2.5 mg by mouth daily with breakfast.    . hydrochlorothiazide (HYDRODIURIL) 25 MG tablet Take 25 mg by mouth daily.    Marland Kitchen lisinopril-hydrochlorothiazide (PRINZIDE,ZESTORETIC) 20-12.5 MG per tablet Take 1 tablet by mouth daily.    Marland Kitchen MAGNESIUM PO Take by mouth.    . methocarbamol (ROBAXIN) 500 MG tablet Take 1 tablet (500 mg total) by mouth 3 (three) times daily as needed for muscle spasms. 60 tablet 0  . Metoprolol Succinate 25 MG CS24 Take by mouth.    Marland Kitchen omeprazole (PRILOSEC) 20 MG capsule Take 20 mg by mouth daily.    . ondansetron (ZOFRAN) 4 MG tablet Take 1 tablet (4 mg total) by mouth every 8 (eight) hours as needed for nausea or vomiting. 20 tablet 0  . oxyCODONE-acetaminophen (PERCOCET) 10-325 MG tablet Take 1 tablet by mouth every 4 (four)  hours as needed for pain. 60 tablet 0  . pioglitazone (ACTOS) 15 MG tablet Take by mouth.    . promethazine (PHENERGAN) 12.5 MG tablet Take 12.5 mg by mouth every 6 (six) hours as needed for nausea or vomiting.    Marland Kitchen rOPINIRole (REQUIP) 2 MG tablet Take 2 mg by mouth at bedtime.    . rosuvastatin (CRESTOR) 5 MG tablet Take 5 mg by mouth every other day.     No current facility-administered medications on file prior to visit.    Allergies  Allergen Reactions  . Sulfa Antibiotics Itching and Rash  . Adhesive [Tape] Dermatitis    Blisters the skin    No results found for this or any previous visit (from the past 2160 hour(s)).  Objective: General: Patient is awake, alert, and oriented x 3 and in no acute distress.  Integument: Skin is warm, dry and supple bilateral. Nails are tender, long, thickened and  dystrophic with subungual debris, consistent with onychomycosis, bilateral hallux nail all other nails are minimally thickened and discolored. No signs of infection. No open lesions or preulcerative lesions present bilateral. Remaining integument unremarkable.  Vasculature:  Dorsalis Pedis pulse 1/4 bilateral. Posterior Tibial pulse  0/4 bilateral.  Capillary fill time <3 sec 1-5 bilateral. Positive hair growth to the level of the digits. Temperature  gradient decreased bilateral. + varicosities present bilateral.  Trace edema present bilateral.   Neurology: The patient has diminished sensation measured with a 5.07/10g Semmes Weinstein Monofilament at all pedal sites bilateral . Vibratory sensation diminished bilateral with tuning fork. No Babinski sign present bilateral.    Musculoskeletal: Hammertoe pedal deformities noted bilateral and leg deformity from table fall noted on left. Muscular strength acceptable in all lower extremity muscular groups bilateral without pain on range of motion . No tenderness with calf compression bilateral.  Assessment and Plan: Problem List Items Addressed  This Visit    None    Visit Diagnoses    Pain due to onychomycosis of toenails of both feet    -  Primary   Type 2 diabetes mellitus with diabetic neuropathy, unspecified whether long term insulin use ([place])       PVD (peripheral vascular disease) ([place])         -Examined patient. -Discussed and educated patient on diabetic foot care, especially with  regards to the vascular, neurological and musculoskeletal systems.  -Stressed the importance of good glycemic control and the detriment of not  controlling glucose levels in relation to the foot. -Mechanically debrided bilateral hallux nails and trimmed all remainder using sterile nail nipper and filed with dremel without incident  -Diabetic shoes dispense patient was well pleased with fit and comfort of her new shoes and insoles patient to wear shoes with appropriate break-in as explained -Answered all patient questions -Patient to return  in 3 months for at risk foot care -Patient advised to call the office if any problems or questions arise in the meantime.  Landis Martins, DPM

## 2018-07-16 ENCOUNTER — Other Ambulatory Visit: Payer: Self-pay

## 2018-08-14 DIAGNOSIS — E1169 Type 2 diabetes mellitus with other specified complication: Secondary | ICD-10-CM | POA: Diagnosis not present

## 2018-08-14 DIAGNOSIS — E1122 Type 2 diabetes mellitus with diabetic chronic kidney disease: Secondary | ICD-10-CM | POA: Diagnosis not present

## 2018-08-14 DIAGNOSIS — I129 Hypertensive chronic kidney disease with stage 1 through stage 4 chronic kidney disease, or unspecified chronic kidney disease: Secondary | ICD-10-CM | POA: Diagnosis not present

## 2018-08-14 DIAGNOSIS — E782 Mixed hyperlipidemia: Secondary | ICD-10-CM | POA: Diagnosis not present

## 2018-09-14 DIAGNOSIS — E1169 Type 2 diabetes mellitus with other specified complication: Secondary | ICD-10-CM | POA: Diagnosis not present

## 2018-09-14 DIAGNOSIS — E782 Mixed hyperlipidemia: Secondary | ICD-10-CM | POA: Diagnosis not present

## 2018-09-14 DIAGNOSIS — I129 Hypertensive chronic kidney disease with stage 1 through stage 4 chronic kidney disease, or unspecified chronic kidney disease: Secondary | ICD-10-CM | POA: Diagnosis not present

## 2018-09-14 DIAGNOSIS — E1122 Type 2 diabetes mellitus with diabetic chronic kidney disease: Secondary | ICD-10-CM | POA: Diagnosis not present

## 2018-10-12 DIAGNOSIS — H35373 Puckering of macula, bilateral: Secondary | ICD-10-CM | POA: Diagnosis not present

## 2018-10-15 ENCOUNTER — Ambulatory Visit (INDEPENDENT_AMBULATORY_CARE_PROVIDER_SITE_OTHER): Payer: Medicare Other | Admitting: Sports Medicine

## 2018-10-15 ENCOUNTER — Encounter: Payer: Self-pay | Admitting: Sports Medicine

## 2018-10-15 VITALS — BP 149/69 | HR 68 | Resp 16

## 2018-10-15 DIAGNOSIS — E1169 Type 2 diabetes mellitus with other specified complication: Secondary | ICD-10-CM | POA: Diagnosis not present

## 2018-10-15 DIAGNOSIS — B351 Tinea unguium: Secondary | ICD-10-CM | POA: Diagnosis not present

## 2018-10-15 DIAGNOSIS — M79674 Pain in right toe(s): Secondary | ICD-10-CM | POA: Diagnosis not present

## 2018-10-15 DIAGNOSIS — I129 Hypertensive chronic kidney disease with stage 1 through stage 4 chronic kidney disease, or unspecified chronic kidney disease: Secondary | ICD-10-CM | POA: Diagnosis not present

## 2018-10-15 DIAGNOSIS — M79675 Pain in left toe(s): Secondary | ICD-10-CM

## 2018-10-15 DIAGNOSIS — E782 Mixed hyperlipidemia: Secondary | ICD-10-CM | POA: Diagnosis not present

## 2018-10-15 DIAGNOSIS — I739 Peripheral vascular disease, unspecified: Secondary | ICD-10-CM

## 2018-10-15 DIAGNOSIS — E1140 Type 2 diabetes mellitus with diabetic neuropathy, unspecified: Secondary | ICD-10-CM

## 2018-10-15 DIAGNOSIS — E1122 Type 2 diabetes mellitus with diabetic chronic kidney disease: Secondary | ICD-10-CM | POA: Diagnosis not present

## 2018-10-15 NOTE — Progress Notes (Signed)
Subjective: Anna Stone is a 77 y.o. female patient with history of diabetes who presents to office today complaining of long,mildly painful nails while ambulating in shoes; unable to trim and for pickup of diabetic shoes. Patient states that the glucose reading this morning was 98mg /dl.  Saw Dr. Nyra Capes 6 months ago.  Last A1c of 5 patient denies any new changes in medication or new problems.    Patient Active Problem List   Diagnosis Date Noted  . 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 amLODipine (NORVASC) 10 MG tablet Take 10 mg by mouth daily.    Marland Kitchen aspirin 325 MG tablet Take 325 mg by mouth daily.    . Carboxymethylcellulose Sodium (REFRESH PLUS OP) Place 1 drop into both eyes 2 (two) times daily as needed.    Marland Kitchen EPINEPHrine 0.3 mg/0.3 mL IJ SOAJ injection Inject into the muscle once.    . fluticasone (FLOVENT DISKUS) 50 MCG/BLIST diskus inhaler Inhale 1 puff into the lungs 2 (two) times daily.    . furosemide (LASIX) 20 MG tablet Take 20 mg by mouth daily.     Marland Kitchen gabapentin (NEURONTIN) 600 MG tablet Take by mouth.    . glyBURIDE (DIABETA) 2.5 MG tablet Take 2.5 mg by mouth daily with breakfast.    . hydrochlorothiazide (HYDRODIURIL) 25 MG tablet Take 25 mg by mouth daily.    Marland Kitchen lisinopril-hydrochlorothiazide (PRINZIDE,ZESTORETIC) 20-12.5 MG per tablet Take 1 tablet by mouth daily.    Marland Kitchen MAGNESIUM PO Take by mouth.    . methocarbamol (ROBAXIN) 500 MG tablet Take 1 tablet (500 mg total) by mouth 3 (three) times daily as needed for muscle spasms. 60 tablet 0  . Metoprolol Succinate 25 MG CS24 Take by mouth.    Marland Kitchen omeprazole (PRILOSEC) 20 MG capsule Take 20 mg by mouth daily.    . ondansetron (ZOFRAN) 4 MG tablet Take 1 tablet (4 mg total) by mouth every 8 (eight) hours as needed for nausea or vomiting. 20 tablet 0  . oxyCODONE-acetaminophen (PERCOCET) 10-325 MG tablet Take 1 tablet by mouth every 4 (four) hours as  needed for pain. 60 tablet 0  . pioglitazone (ACTOS) 15 MG tablet Take by mouth.    . promethazine (PHENERGAN) 12.5 MG tablet Take 12.5 mg by mouth every 6 (six) hours as needed for nausea or vomiting.    Marland Kitchen rOPINIRole (REQUIP) 2 MG tablet Take 2 mg by mouth at bedtime.    . rosuvastatin (CRESTOR) 5 MG tablet Take 5 mg by mouth every other day.     No current facility-administered medications on file prior to visit.    Allergies  Allergen Reactions  . Sulfa Antibiotics Itching and Rash  . Adhesive [Tape] Dermatitis    Blisters the skin    No results found for this or any previous visit (from the past 2160 hour(s)).  Objective: General: Patient is awake, alert, and oriented x 3 and in no acute distress.  Integument: Skin is warm, dry and supple bilateral. Nails are tender, long, thickened and  dystrophic with subungual debris, consistent with onychomycosis, bilateral hallux nail all other nails are minimally thickened and discolored. No signs of infection. No open lesions or preulcerative lesions present bilateral. Remaining integument unremarkable.  Vasculature:  Dorsalis Pedis pulse 1/4 bilateral. Posterior Tibial pulse  0/4 bilateral.  Capillary fill time <3 sec 1-5 bilateral. Positive hair growth to the level of the digits. Temperature gradient decreased  bilateral. + varicosities present bilateral.  Trace edema present bilateral.   Neurology: The patient has diminished sensation measured with a 5.07/10g Semmes Weinstein Monofilament at all pedal sites bilateral . Vibratory sensation diminished bilateral with tuning fork. No Babinski sign present bilateral.    Musculoskeletal: Hammertoe pedal deformities noted bilateral and leg deformity from table fall noted on left. Muscular strength acceptable in all lower extremity muscular groups bilateral without pain on range of motion . No tenderness with calf compression bilateral.  Assessment and Plan: Problem List Items Addressed This  Visit    None    Visit Diagnoses    Pain due to onychomycosis of toenails of both feet    -  Primary   Type 2 diabetes mellitus with diabetic neuropathy, unspecified whether long term insulin use ([place])       PVD (peripheral vascular disease) ([place])         -Examined patient. -Discussed and educated patient on diabetic foot care, especially with  regards to the vascular, neurological and musculoskeletal systems.  -Mechanically debrided bilateral hallux nails and trimmed all remainder using sterile nail nipper and filed with dremel without incident  -Continue diabetic shoes -Answered all patient questions -Patient to return  in 3 months for at risk foot care -Patient advised to call the office if any problems or questions arise in the meantime.  Landis Martins, DPM

## 2018-10-19 NOTE — Progress Notes (Signed)
Patient ID: Anna Stone, female   DOB: 1941-02-15, 77 y.o.   MRN: [id number]  Patient presents to be measured for diabetic shoes and inserts with [facility].  We will call when shoes and inserts arrive

## 2018-10-20 DIAGNOSIS — Z1231 Encounter for screening mammogram for malignant neoplasm of breast: Secondary | ICD-10-CM | POA: Diagnosis not present

## 2018-10-22 DIAGNOSIS — G8929 Other chronic pain: Secondary | ICD-10-CM | POA: Diagnosis not present

## 2018-10-22 DIAGNOSIS — Z853 Personal history of malignant neoplasm of breast: Secondary | ICD-10-CM

## 2018-10-22 DIAGNOSIS — M170 Bilateral primary osteoarthritis of knee: Secondary | ICD-10-CM | POA: Diagnosis not present

## 2018-10-29 DIAGNOSIS — E1122 Type 2 diabetes mellitus with diabetic chronic kidney disease: Secondary | ICD-10-CM | POA: Diagnosis not present

## 2018-10-29 DIAGNOSIS — E1169 Type 2 diabetes mellitus with other specified complication: Secondary | ICD-10-CM | POA: Diagnosis not present

## 2018-11-02 DIAGNOSIS — B029 Zoster without complications: Secondary | ICD-10-CM | POA: Diagnosis not present

## 2018-11-02 DIAGNOSIS — Z6830 Body mass index (BMI) 30.0-30.9, adult: Secondary | ICD-10-CM | POA: Diagnosis not present

## 2018-11-02 DIAGNOSIS — Z139 Encounter for screening, unspecified: Secondary | ICD-10-CM | POA: Diagnosis not present

## 2018-11-05 DIAGNOSIS — N183 Chronic kidney disease, stage 3 (moderate): Secondary | ICD-10-CM | POA: Diagnosis not present

## 2018-11-05 DIAGNOSIS — E1169 Type 2 diabetes mellitus with other specified complication: Secondary | ICD-10-CM | POA: Diagnosis not present

## 2018-11-05 DIAGNOSIS — E1122 Type 2 diabetes mellitus with diabetic chronic kidney disease: Secondary | ICD-10-CM | POA: Diagnosis not present

## 2018-11-05 DIAGNOSIS — E782 Mixed hyperlipidemia: Secondary | ICD-10-CM | POA: Diagnosis not present

## 2018-11-05 DIAGNOSIS — I129 Hypertensive chronic kidney disease with stage 1 through stage 4 chronic kidney disease, or unspecified chronic kidney disease: Secondary | ICD-10-CM | POA: Diagnosis not present

## 2018-11-14 DIAGNOSIS — E1169 Type 2 diabetes mellitus with other specified complication: Secondary | ICD-10-CM | POA: Diagnosis not present

## 2018-11-14 DIAGNOSIS — N183 Chronic kidney disease, stage 3 (moderate): Secondary | ICD-10-CM | POA: Diagnosis not present

## 2018-11-14 DIAGNOSIS — E1122 Type 2 diabetes mellitus with diabetic chronic kidney disease: Secondary | ICD-10-CM | POA: Diagnosis not present

## 2018-11-14 DIAGNOSIS — I129 Hypertensive chronic kidney disease with stage 1 through stage 4 chronic kidney disease, or unspecified chronic kidney disease: Secondary | ICD-10-CM | POA: Diagnosis not present

## 2018-11-18 DIAGNOSIS — H04551 Acquired stenosis of right nasolacrimal duct: Secondary | ICD-10-CM | POA: Diagnosis not present

## 2018-11-18 DIAGNOSIS — H04221 Epiphora due to insufficient drainage, right lacrimal gland: Secondary | ICD-10-CM | POA: Diagnosis not present

## 2018-11-19 DIAGNOSIS — Z6831 Body mass index (BMI) 31.0-31.9, adult: Secondary | ICD-10-CM | POA: Diagnosis not present

## 2018-11-25 DIAGNOSIS — E669 Obesity, unspecified: Secondary | ICD-10-CM | POA: Diagnosis not present

## 2018-11-25 DIAGNOSIS — Z6831 Body mass index (BMI) 31.0-31.9, adult: Secondary | ICD-10-CM | POA: Diagnosis not present

## 2018-12-02 DIAGNOSIS — E669 Obesity, unspecified: Secondary | ICD-10-CM | POA: Diagnosis not present

## 2018-12-02 DIAGNOSIS — Z6831 Body mass index (BMI) 31.0-31.9, adult: Secondary | ICD-10-CM | POA: Diagnosis not present

## 2018-12-15 DIAGNOSIS — N183 Chronic kidney disease, stage 3 (moderate): Secondary | ICD-10-CM | POA: Diagnosis not present

## 2018-12-15 DIAGNOSIS — I129 Hypertensive chronic kidney disease with stage 1 through stage 4 chronic kidney disease, or unspecified chronic kidney disease: Secondary | ICD-10-CM | POA: Diagnosis not present

## 2018-12-15 DIAGNOSIS — E1122 Type 2 diabetes mellitus with diabetic chronic kidney disease: Secondary | ICD-10-CM | POA: Diagnosis not present

## 2018-12-15 DIAGNOSIS — E1169 Type 2 diabetes mellitus with other specified complication: Secondary | ICD-10-CM | POA: Diagnosis not present

## 2018-12-22 DIAGNOSIS — E119 Type 2 diabetes mellitus without complications: Secondary | ICD-10-CM | POA: Diagnosis not present

## 2018-12-22 DIAGNOSIS — H5789 Other specified disorders of eye and adnexa: Secondary | ICD-10-CM | POA: Diagnosis not present

## 2018-12-22 DIAGNOSIS — H04551 Acquired stenosis of right nasolacrimal duct: Secondary | ICD-10-CM | POA: Diagnosis not present

## 2018-12-22 DIAGNOSIS — H04221 Epiphora due to insufficient drainage, right lacrimal gland: Secondary | ICD-10-CM | POA: Diagnosis not present

## 2019-01-13 ENCOUNTER — Ambulatory Visit: Payer: Medicare Other | Admitting: Sports Medicine

## 2019-01-15 DIAGNOSIS — N183 Chronic kidney disease, stage 3 (moderate): Secondary | ICD-10-CM | POA: Diagnosis not present

## 2019-01-15 DIAGNOSIS — I129 Hypertensive chronic kidney disease with stage 1 through stage 4 chronic kidney disease, or unspecified chronic kidney disease: Secondary | ICD-10-CM | POA: Diagnosis not present

## 2019-01-15 DIAGNOSIS — E1122 Type 2 diabetes mellitus with diabetic chronic kidney disease: Secondary | ICD-10-CM | POA: Diagnosis not present

## 2019-01-15 DIAGNOSIS — E1169 Type 2 diabetes mellitus with other specified complication: Secondary | ICD-10-CM | POA: Diagnosis not present

## 2019-01-28 ENCOUNTER — Ambulatory Visit (INDEPENDENT_AMBULATORY_CARE_PROVIDER_SITE_OTHER): Payer: Medicare Other | Admitting: Sports Medicine

## 2019-01-28 ENCOUNTER — Encounter: Payer: Self-pay | Admitting: Sports Medicine

## 2019-01-28 VITALS — BP 140/67 | HR 79 | Resp 16

## 2019-01-28 DIAGNOSIS — M79675 Pain in left toe(s): Secondary | ICD-10-CM

## 2019-01-28 DIAGNOSIS — M79674 Pain in right toe(s): Secondary | ICD-10-CM | POA: Diagnosis not present

## 2019-01-28 DIAGNOSIS — M21962 Unspecified acquired deformity of left lower leg: Secondary | ICD-10-CM

## 2019-01-28 DIAGNOSIS — E1140 Type 2 diabetes mellitus with diabetic neuropathy, unspecified: Secondary | ICD-10-CM | POA: Diagnosis not present

## 2019-01-28 DIAGNOSIS — I739 Peripheral vascular disease, unspecified: Secondary | ICD-10-CM

## 2019-01-28 DIAGNOSIS — B351 Tinea unguium: Secondary | ICD-10-CM

## 2019-01-28 DIAGNOSIS — M2042 Other hammer toe(s) (acquired), left foot: Secondary | ICD-10-CM

## 2019-01-28 DIAGNOSIS — M2041 Other hammer toe(s) (acquired), right foot: Secondary | ICD-10-CM

## 2019-01-28 NOTE — Progress Notes (Signed)
Subjective: Anna Stone is a 78 y.o. female patient with history of diabetes who presents to office today complaining of long,mildly painful nails while ambulating in shoes; unable to trim. Patient states that the glucose reading this morning was not recorded but on yesterday was 120.  Last A1c of 5.  Reports she saw Dr. Nyra Capes 4 months ago.  No other issues noted.  Patient Active Problem List   Diagnosis Date Noted  . 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 amLODipine (NORVASC) 10 MG tablet Take 10 mg by mouth daily.    Marland Kitchen aspirin 325 MG tablet Take 325 mg by mouth daily.    . Carboxymethylcellulose Sodium (REFRESH PLUS OP) Place 1 drop into both eyes 2 (two) times daily as needed.    Marland Kitchen EPINEPHrine 0.3 mg/0.3 mL IJ SOAJ injection Inject into the muscle once.    . fluticasone (FLOVENT DISKUS) 50 MCG/BLIST diskus inhaler Inhale 1 puff into the lungs 2 (two) times daily.    . furosemide (LASIX) 20 MG tablet Take 20 mg by mouth daily.     Marland Kitchen gabapentin (NEURONTIN) 600 MG tablet Take by mouth.    . glyBURIDE (DIABETA) 2.5 MG tablet Take 2.5 mg by mouth daily with breakfast.    . hydrochlorothiazide (HYDRODIURIL) 25 MG tablet Take 25 mg by mouth daily.    Marland Kitchen lisinopril-hydrochlorothiazide (PRINZIDE,ZESTORETIC) 20-12.5 MG per tablet Take 1 tablet by mouth daily.    Marland Kitchen MAGNESIUM PO Take by mouth.    . methocarbamol (ROBAXIN) 500 MG tablet Take 1 tablet (500 mg total) by mouth 3 (three) times daily as needed for muscle spasms. 60 tablet 0  . Metoprolol Succinate 25 MG CS24 Take by mouth.    Marland Kitchen omeprazole (PRILOSEC) 20 MG capsule Take 20 mg by mouth daily.    . ondansetron (ZOFRAN) 4 MG tablet Take 1 tablet (4 mg total) by mouth every 8 (eight) hours as needed for nausea or vomiting. 20 tablet 0  . oxyCODONE-acetaminophen (PERCOCET) 10-325 MG tablet Take 1 tablet by mouth every 4 (four) hours as needed for pain. 60 tablet 0  .  pioglitazone (ACTOS) 15 MG tablet Take by mouth.    . promethazine (PHENERGAN) 12.5 MG tablet Take 12.5 mg by mouth every 6 (six) hours as needed for nausea or vomiting.    Marland Kitchen rOPINIRole (REQUIP) 2 MG tablet Take 2 mg by mouth at bedtime.    . rosuvastatin (CRESTOR) 5 MG tablet Take 5 mg by mouth every other day.    . traMADol (ULTRAM) 50 MG tablet Take 50 mg by mouth every 6 (six) hours as needed. for pain     No current facility-administered medications on file prior to visit.    Allergies  Allergen Reactions  . Sulfa Antibiotics Itching and Rash  . Adhesive [Tape] Dermatitis    Blisters the skin    No results found for this or any previous visit (from the past 2160 hour(s)).  Objective: General: Patient is awake, alert, and oriented x 3 and in no acute distress.  Integument: Skin is warm, dry and supple bilateral. Nails are tender, long, thickened and  dystrophic with subungual debris, consistent with onychomycosis, bilateral hallux nail all other nails are minimally thickened and discolored. No signs of infection. No open lesions or preulcerative lesions present bilateral. Remaining integument unremarkable.  Vasculature:  Dorsalis Pedis pulse 1/4 bilateral. Posterior Tibial pulse  0/4 bilateral.  Capillary fill  time <3 sec 1-5 bilateral. Positive hair growth to the level of the digits. Temperature gradient decreased bilateral. + varicosities present bilateral.  Trace edema present bilateral.   Neurology: The patient has diminished sensation measured with a 5.07/10g Semmes Weinstein Monofilament at all pedal sites bilateral . Vibratory sensation diminished bilateral with tuning fork. No Babinski sign present bilateral.    Musculoskeletal: Hammertoe pedal deformities noted bilateral and leg deformity from table fall noted on left. Muscular strength acceptable in all lower extremity muscular groups bilateral without pain on range of motion . No tenderness with calf compression  bilateral.  Assessment and Plan: Problem List Items Addressed This Visit    None    Visit Diagnoses    Pain due to onychomycosis of toenails of both feet    -  Primary   Type 2 diabetes mellitus with diabetic neuropathy, unspecified whether long term insulin use ([facility])       PVD (peripheral vascular disease) ([facility])       Hammer toes of both feet       Acquired deformity of left lower leg         -Examined patient. -Discussed and educated patient on diabetic foot care, especially with  regards to the vascular, neurological and musculoskeletal systems.  -Mechanically debrided all nails using sterile nail nipper and filed with dremel without incident  -Answered all patient questions -Patient to return  in 3 months for at risk foot care -Patient advised to call the office if any problems or questions arise in the meantime.  Landis Martins, DPM

## 2019-01-29 DIAGNOSIS — E1169 Type 2 diabetes mellitus with other specified complication: Secondary | ICD-10-CM | POA: Diagnosis not present

## 2019-01-29 DIAGNOSIS — E1122 Type 2 diabetes mellitus with diabetic chronic kidney disease: Secondary | ICD-10-CM | POA: Diagnosis not present

## 2019-02-05 DIAGNOSIS — E1122 Type 2 diabetes mellitus with diabetic chronic kidney disease: Secondary | ICD-10-CM | POA: Diagnosis not present

## 2019-02-05 DIAGNOSIS — E876 Hypokalemia: Secondary | ICD-10-CM | POA: Diagnosis not present

## 2019-02-05 DIAGNOSIS — I129 Hypertensive chronic kidney disease with stage 1 through stage 4 chronic kidney disease, or unspecified chronic kidney disease: Secondary | ICD-10-CM | POA: Diagnosis not present

## 2019-02-05 DIAGNOSIS — E1169 Type 2 diabetes mellitus with other specified complication: Secondary | ICD-10-CM | POA: Diagnosis not present

## 2019-02-05 DIAGNOSIS — E782 Mixed hyperlipidemia: Secondary | ICD-10-CM | POA: Diagnosis not present

## 2019-02-12 DIAGNOSIS — N183 Chronic kidney disease, stage 3 (moderate): Secondary | ICD-10-CM | POA: Diagnosis not present

## 2019-02-12 DIAGNOSIS — E876 Hypokalemia: Secondary | ICD-10-CM | POA: Diagnosis not present

## 2019-02-12 DIAGNOSIS — I129 Hypertensive chronic kidney disease with stage 1 through stage 4 chronic kidney disease, or unspecified chronic kidney disease: Secondary | ICD-10-CM | POA: Diagnosis not present

## 2019-02-12 DIAGNOSIS — R0902 Hypoxemia: Secondary | ICD-10-CM | POA: Diagnosis not present

## 2019-02-12 DIAGNOSIS — E1169 Type 2 diabetes mellitus with other specified complication: Secondary | ICD-10-CM | POA: Diagnosis not present

## 2019-02-19 DIAGNOSIS — R0602 Shortness of breath: Secondary | ICD-10-CM | POA: Diagnosis not present

## 2019-02-19 DIAGNOSIS — N183 Chronic kidney disease, stage 3 (moderate): Secondary | ICD-10-CM | POA: Diagnosis not present

## 2019-02-19 DIAGNOSIS — I129 Hypertensive chronic kidney disease with stage 1 through stage 4 chronic kidney disease, or unspecified chronic kidney disease: Secondary | ICD-10-CM | POA: Diagnosis not present

## 2019-02-19 DIAGNOSIS — R0902 Hypoxemia: Secondary | ICD-10-CM | POA: Diagnosis not present

## 2019-02-19 DIAGNOSIS — R0600 Dyspnea, unspecified: Secondary | ICD-10-CM | POA: Diagnosis not present

## 2019-02-23 DIAGNOSIS — R0600 Dyspnea, unspecified: Secondary | ICD-10-CM | POA: Diagnosis not present

## 2019-02-25 DIAGNOSIS — Z6830 Body mass index (BMI) 30.0-30.9, adult: Secondary | ICD-10-CM | POA: Diagnosis not present

## 2019-02-25 DIAGNOSIS — R0902 Hypoxemia: Secondary | ICD-10-CM | POA: Diagnosis not present

## 2019-02-25 DIAGNOSIS — R0602 Shortness of breath: Secondary | ICD-10-CM | POA: Diagnosis not present

## 2019-03-12 DIAGNOSIS — R0902 Hypoxemia: Secondary | ICD-10-CM | POA: Diagnosis not present

## 2019-03-16 DIAGNOSIS — N183 Chronic kidney disease, stage 3 (moderate): Secondary | ICD-10-CM | POA: Diagnosis not present

## 2019-03-16 DIAGNOSIS — Z6830 Body mass index (BMI) 30.0-30.9, adult: Secondary | ICD-10-CM | POA: Diagnosis not present

## 2019-03-16 DIAGNOSIS — I129 Hypertensive chronic kidney disease with stage 1 through stage 4 chronic kidney disease, or unspecified chronic kidney disease: Secondary | ICD-10-CM | POA: Diagnosis not present

## 2019-03-16 DIAGNOSIS — R0902 Hypoxemia: Secondary | ICD-10-CM | POA: Diagnosis not present

## 2019-04-15 DIAGNOSIS — R0902 Hypoxemia: Secondary | ICD-10-CM | POA: Diagnosis not present

## 2019-04-15 DIAGNOSIS — N183 Chronic kidney disease, stage 3 (moderate): Secondary | ICD-10-CM | POA: Diagnosis not present

## 2019-04-15 DIAGNOSIS — I129 Hypertensive chronic kidney disease with stage 1 through stage 4 chronic kidney disease, or unspecified chronic kidney disease: Secondary | ICD-10-CM | POA: Diagnosis not present

## 2019-04-15 DIAGNOSIS — Z6830 Body mass index (BMI) 30.0-30.9, adult: Secondary | ICD-10-CM | POA: Diagnosis not present

## 2019-04-29 ENCOUNTER — Other Ambulatory Visit: Payer: Self-pay

## 2019-04-29 ENCOUNTER — Ambulatory Visit (INDEPENDENT_AMBULATORY_CARE_PROVIDER_SITE_OTHER): Payer: Medicare Other | Admitting: Sports Medicine

## 2019-04-29 ENCOUNTER — Encounter: Payer: Self-pay | Admitting: Sports Medicine

## 2019-04-29 VITALS — BP 161/64 | HR 67 | Temp 96.7°F | Resp 16

## 2019-04-29 DIAGNOSIS — M79674 Pain in right toe(s): Secondary | ICD-10-CM

## 2019-04-29 DIAGNOSIS — I739 Peripheral vascular disease, unspecified: Secondary | ICD-10-CM

## 2019-04-29 DIAGNOSIS — B351 Tinea unguium: Secondary | ICD-10-CM | POA: Diagnosis not present

## 2019-04-29 DIAGNOSIS — M79675 Pain in left toe(s): Secondary | ICD-10-CM

## 2019-04-29 DIAGNOSIS — E1140 Type 2 diabetes mellitus with diabetic neuropathy, unspecified: Secondary | ICD-10-CM

## 2019-04-29 NOTE — Progress Notes (Signed)
Subjective: Anna Stone is a 78 y.o. female patient with history of diabetes who presents to office today complaining of long,mildly painful nails while ambulating in shoes; unable to trim. Patient states that the glucose reading this morning was 82 last A1c of 5.  Reports she saw Dr. Nyra Capes several months ago and will have a wellness check again in July.  No other issues noted.  Patient Active Problem List   Diagnosis Date Noted  . 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 amLODipine (NORVASC) 10 MG tablet Take 10 mg by mouth daily.    Marland Kitchen amLODipine (NORVASC) 5 MG tablet Take 5 mg by mouth daily.    Marland Kitchen aspirin 325 MG tablet Take 325 mg by mouth daily.    . Carboxymethylcellulose Sodium (REFRESH PLUS OP) Place 1 drop into both eyes 2 (two) times daily as needed.    Marland Kitchen EPINEPHrine 0.3 mg/0.3 mL IJ SOAJ injection Inject into the muscle once.    . fluticasone (FLOVENT DISKUS) 50 MCG/BLIST diskus inhaler Inhale 1 puff into the lungs 2 (two) times daily.    . furosemide (LASIX) 20 MG tablet Take 20 mg by mouth daily.     Marland Kitchen gabapentin (NEURONTIN) 600 MG tablet Take by mouth.    . glyBURIDE (DIABETA) 2.5 MG tablet Take 2.5 mg by mouth daily with breakfast.    . hydrochlorothiazide (HYDRODIURIL) 25 MG tablet Take 25 mg by mouth daily.    Marland Kitchen lisinopril-hydrochlorothiazide (PRINZIDE,ZESTORETIC) 20-12.5 MG per tablet Take 1 tablet by mouth daily.    Marland Kitchen MAGNESIUM PO Take by mouth.    . methocarbamol (ROBAXIN) 500 MG tablet Take 1 tablet (500 mg total) by mouth 3 (three) times daily as needed for muscle spasms. 60 tablet 0  . Metoprolol Succinate 25 MG CS24 Take by mouth.    Marland Kitchen omeprazole (PRILOSEC) 20 MG capsule Take 20 mg by mouth daily.    . ondansetron (ZOFRAN) 4 MG tablet Take 1 tablet (4 mg total) by mouth every 8 (eight) hours as needed for nausea or vomiting. 20 tablet 0  . oxyCODONE-acetaminophen (PERCOCET) 10-325 MG tablet  Take 1 tablet by mouth every 4 (four) hours as needed for pain. 60 tablet 0  . pioglitazone (ACTOS) 15 MG tablet Take by mouth.    . promethazine (PHENERGAN) 12.5 MG tablet Take 12.5 mg by mouth every 6 (six) hours as needed for nausea or vomiting.    Marland Kitchen rOPINIRole (REQUIP) 2 MG tablet Take 2 mg by mouth at bedtime.    . rosuvastatin (CRESTOR) 5 MG tablet Take 5 mg by mouth every other day.    . spironolactone (ALDACTONE) 25 MG tablet Take 25 mg by mouth daily.    . traMADol (ULTRAM) 50 MG tablet Take 50 mg by mouth every 6 (six) hours as needed. for pain     No current facility-administered medications on file prior to visit.    Allergies  Allergen Reactions  . Sulfa Antibiotics Itching and Rash  . Adhesive [Tape] Dermatitis    Blisters the skin    No results found for this or any previous visit (from the past 2160 hour(s)).  Objective: General: Patient is awake, alert, and oriented x 3 and in no acute distress.  Integument: Skin is warm, dry and supple bilateral. Nails are tender, long, thickened and  dystrophic with subungual debris, consistent with onychomycosis, bilateral hallux nail all other nails are minimally thickened and discolored.  No signs of infection. No open lesions or preulcerative lesions present bilateral. Remaining integument unremarkable.  Vasculature:  Dorsalis Pedis pulse 1/4 bilateral. Posterior Tibial pulse  0/4 bilateral.  Capillary fill time <3 sec 1-5 bilateral. Positive hair growth to the level of the digits. Temperature gradient decreased bilateral. + varicosities present bilateral.  Trace edema present bilateral.   Neurology: The patient has diminished sensation measured with a 5.07/10g Semmes Weinstein Monofilament at all pedal sites bilateral . Vibratory sensation diminished bilateral with tuning fork. No Babinski sign present bilateral.    Musculoskeletal: Hammertoe pedal deformities noted bilateral and leg deformity from table fall noted on left.  Muscular strength acceptable in all lower extremity muscular groups bilateral without pain on range of motion . No tenderness with calf compression bilateral.  Assessment and Plan: Problem List Items Addressed This Visit    None    Visit Diagnoses    Pain due to onychomycosis of toenails of both feet    -  Primary   Type 2 diabetes mellitus with diabetic neuropathy, unspecified whether long term insulin use ([facility])       PVD (peripheral vascular disease) ([facility])       Relevant Medications   amLODipine (NORVASC) 5 MG tablet   spironolactone (ALDACTONE) 25 MG tablet     -Examined patient. -Discussed and educated patient on diabetic foot care, especially with  regards to the vascular, neurological and musculoskeletal systems.  -Mechanically debrided all nails using sterile nail nipper and filed with dremel without incident  -Advised elevation and use of compression garments to assist with edema control bilateral -Patient to return  in 3 months for at risk foot care -Patient advised to call the office if any problems or questions arise in the meantime.  Landis Martins, DPM

## 2019-05-14 DIAGNOSIS — E1169 Type 2 diabetes mellitus with other specified complication: Secondary | ICD-10-CM | POA: Diagnosis not present

## 2019-05-14 DIAGNOSIS — R0902 Hypoxemia: Secondary | ICD-10-CM | POA: Diagnosis not present

## 2019-05-14 DIAGNOSIS — I129 Hypertensive chronic kidney disease with stage 1 through stage 4 chronic kidney disease, or unspecified chronic kidney disease: Secondary | ICD-10-CM | POA: Diagnosis not present

## 2019-05-14 DIAGNOSIS — N183 Chronic kidney disease, stage 3 (moderate): Secondary | ICD-10-CM | POA: Diagnosis not present

## 2019-06-02 DIAGNOSIS — D1801 Hemangioma of skin and subcutaneous tissue: Secondary | ICD-10-CM | POA: Diagnosis not present

## 2019-06-02 DIAGNOSIS — D225 Melanocytic nevi of trunk: Secondary | ICD-10-CM | POA: Diagnosis not present

## 2019-06-02 DIAGNOSIS — L820 Inflamed seborrheic keratosis: Secondary | ICD-10-CM | POA: Diagnosis not present

## 2019-06-15 DIAGNOSIS — I129 Hypertensive chronic kidney disease with stage 1 through stage 4 chronic kidney disease, or unspecified chronic kidney disease: Secondary | ICD-10-CM | POA: Diagnosis not present

## 2019-06-15 DIAGNOSIS — E1169 Type 2 diabetes mellitus with other specified complication: Secondary | ICD-10-CM | POA: Diagnosis not present

## 2019-06-15 DIAGNOSIS — N183 Chronic kidney disease, stage 3 (moderate): Secondary | ICD-10-CM | POA: Diagnosis not present

## 2019-06-15 DIAGNOSIS — R0902 Hypoxemia: Secondary | ICD-10-CM | POA: Diagnosis not present

## 2019-07-13 DIAGNOSIS — Z1331 Encounter for screening for depression: Secondary | ICD-10-CM | POA: Diagnosis not present

## 2019-07-13 DIAGNOSIS — Z0000 Encounter for general adult medical examination without abnormal findings: Secondary | ICD-10-CM | POA: Diagnosis not present

## 2019-07-13 DIAGNOSIS — Z139 Encounter for screening, unspecified: Secondary | ICD-10-CM | POA: Diagnosis not present

## 2019-07-16 DIAGNOSIS — N183 Chronic kidney disease, stage 3 (moderate): Secondary | ICD-10-CM | POA: Diagnosis not present

## 2019-07-16 DIAGNOSIS — R0902 Hypoxemia: Secondary | ICD-10-CM | POA: Diagnosis not present

## 2019-07-16 DIAGNOSIS — E1169 Type 2 diabetes mellitus with other specified complication: Secondary | ICD-10-CM | POA: Diagnosis not present

## 2019-07-16 DIAGNOSIS — I129 Hypertensive chronic kidney disease with stage 1 through stage 4 chronic kidney disease, or unspecified chronic kidney disease: Secondary | ICD-10-CM | POA: Diagnosis not present

## 2019-07-22 DIAGNOSIS — G8929 Other chronic pain: Secondary | ICD-10-CM | POA: Diagnosis not present

## 2019-07-22 DIAGNOSIS — M25562 Pain in left knee: Secondary | ICD-10-CM | POA: Diagnosis not present

## 2019-07-22 DIAGNOSIS — M25561 Pain in right knee: Secondary | ICD-10-CM | POA: Diagnosis not present

## 2019-07-28 ENCOUNTER — Ambulatory Visit (INDEPENDENT_AMBULATORY_CARE_PROVIDER_SITE_OTHER): Payer: Medicare Other | Admitting: Sports Medicine

## 2019-07-28 ENCOUNTER — Encounter: Payer: Self-pay | Admitting: Sports Medicine

## 2019-07-28 ENCOUNTER — Other Ambulatory Visit: Payer: Self-pay

## 2019-07-28 VITALS — Temp 98.5°F | Resp 16

## 2019-07-28 DIAGNOSIS — I739 Peripheral vascular disease, unspecified: Secondary | ICD-10-CM

## 2019-07-28 DIAGNOSIS — M79675 Pain in left toe(s): Secondary | ICD-10-CM

## 2019-07-28 DIAGNOSIS — E1140 Type 2 diabetes mellitus with diabetic neuropathy, unspecified: Secondary | ICD-10-CM | POA: Diagnosis not present

## 2019-07-28 DIAGNOSIS — M79674 Pain in right toe(s): Secondary | ICD-10-CM | POA: Diagnosis not present

## 2019-07-28 DIAGNOSIS — B351 Tinea unguium: Secondary | ICD-10-CM

## 2019-07-28 NOTE — Progress Notes (Signed)
Subjective: LANIESHA DAS is a 78 y.o. female patient with history of diabetes who presents to office today complaining of long,mildly painful nails while ambulating in shoes; unable to trim. Patient states that the glucose reading this morning was 95 last A1c of 5.  Reports she saw Dr. Nyra Capes x 1 month ago.  No other issues noted.  Patient Active Problem List   Diagnosis Date Noted  . 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 amLODipine (NORVASC) 10 MG tablet Take 10 mg by mouth daily.    Marland Kitchen amLODipine (NORVASC) 5 MG tablet Take 5 mg by mouth daily.    Marland Kitchen aspirin 325 MG tablet Take 325 mg by mouth daily.    . Carboxymethylcellulose Sodium (REFRESH PLUS OP) Place 1 drop into both eyes 2 (two) times daily as needed.    Marland Kitchen EPINEPHrine 0.3 mg/0.3 mL IJ SOAJ injection Inject into the muscle once.    . fluticasone (FLOVENT DISKUS) 50 MCG/BLIST diskus inhaler Inhale 1 puff into the lungs 2 (two) times daily.    . furosemide (LASIX) 20 MG tablet Take 20 mg by mouth daily.     Marland Kitchen gabapentin (NEURONTIN) 600 MG tablet Take by mouth.    . glyBURIDE (DIABETA) 2.5 MG tablet Take 2.5 mg by mouth daily with breakfast.    . hydrochlorothiazide (HYDRODIURIL) 25 MG tablet Take 25 mg by mouth daily.    Marland Kitchen lisinopril-hydrochlorothiazide (PRINZIDE,ZESTORETIC) 20-12.5 MG per tablet Take 1 tablet by mouth daily.    Marland Kitchen MAGNESIUM PO Take by mouth.    . methocarbamol (ROBAXIN) 500 MG tablet Take 1 tablet (500 mg total) by mouth 3 (three) times daily as needed for muscle spasms. 60 tablet 0  . Metoprolol Succinate 25 MG CS24 Take by mouth.    Marland Kitchen omeprazole (PRILOSEC) 20 MG capsule Take 20 mg by mouth daily.    . ondansetron (ZOFRAN) 4 MG tablet Take 1 tablet (4 mg total) by mouth every 8 (eight) hours as needed for nausea or vomiting. 20 tablet 0  . oxyCODONE-acetaminophen (PERCOCET) 10-325 MG tablet Take 1 tablet by mouth every 4 (four) hours as  needed for pain. 60 tablet 0  . pioglitazone (ACTOS) 15 MG tablet Take by mouth.    . promethazine (PHENERGAN) 12.5 MG tablet Take 12.5 mg by mouth every 6 (six) hours as needed for nausea or vomiting.    Marland Kitchen rOPINIRole (REQUIP) 2 MG tablet Take 2 mg by mouth at bedtime.    . rosuvastatin (CRESTOR) 5 MG tablet Take 5 mg by mouth every other day.    . spironolactone (ALDACTONE) 25 MG tablet Take 25 mg by mouth daily.    . traMADol (ULTRAM) 50 MG tablet Take 50 mg by mouth every 6 (six) hours as needed. for pain     No current facility-administered medications on file prior to visit.    Allergies  Allergen Reactions  . Sulfa Antibiotics Itching and Rash  . Adhesive [Tape] Dermatitis    Blisters the skin    No results found for this or any previous visit (from the past 2160 hour(s)).  Objective: General: Patient is awake, alert, and oriented x 3 and in no acute distress.  Integument: Skin is warm, dry and supple bilateral. Nails are tender, long, thickened and  dystrophic with subungual debris, consistent with onychomycosis, bilateral hallux nail all other nails are minimally thickened and discolored. No signs of infection. No open lesions or  preulcerative lesions present bilateral. Remaining integument unremarkable.  Vasculature:  Dorsalis Pedis pulse 1/4 bilateral. Posterior Tibial pulse  0/4 bilateral.  Capillary fill time <3 sec 1-5 bilateral. Positive hair growth to the level of the digits. Temperature gradient decreased bilateral. + varicosities present bilateral.  Trace edema present bilateral.   Neurology: The patient has diminished sensation measured with a 5.07/10g Semmes Weinstein Monofilament at all pedal sites bilateral . Vibratory sensation diminished bilateral with tuning fork. No Babinski sign present bilateral.    Musculoskeletal: Hammertoe pedal deformities noted bilateral and leg deformity from table fall noted on left. Muscular strength acceptable in all lower extremity  muscular groups bilateral without pain on range of motion . No tenderness with calf compression bilateral.  Assessment and Plan: Problem List Items Addressed This Visit    None    Visit Diagnoses    Pain due to onychomycosis of toenails of both feet    -  Primary   Type 2 diabetes mellitus with diabetic neuropathy, unspecified whether long term insulin use ([place])       PVD (peripheral vascular disease) ([place])         -Examined patient. -Discussed and educated patient on diabetic foot care, especially with  regards to the vascular, neurological and musculoskeletal systems.  -Mechanically debrided all nails using sterile nail nipper and filed with dremel without incident  -Encourage patient to continue with elevation for edema control -Patient to return  in 3 months for at risk foot care -Patient advised to call the office if any problems or questions arise in the meantime.  Landis Martins, DPM

## 2019-08-05 DIAGNOSIS — E1122 Type 2 diabetes mellitus with diabetic chronic kidney disease: Secondary | ICD-10-CM | POA: Diagnosis not present

## 2019-08-05 DIAGNOSIS — E1169 Type 2 diabetes mellitus with other specified complication: Secondary | ICD-10-CM | POA: Diagnosis not present

## 2019-08-09 DIAGNOSIS — E782 Mixed hyperlipidemia: Secondary | ICD-10-CM | POA: Diagnosis not present

## 2019-08-09 DIAGNOSIS — E1122 Type 2 diabetes mellitus with diabetic chronic kidney disease: Secondary | ICD-10-CM | POA: Diagnosis not present

## 2019-08-09 DIAGNOSIS — I129 Hypertensive chronic kidney disease with stage 1 through stage 4 chronic kidney disease, or unspecified chronic kidney disease: Secondary | ICD-10-CM | POA: Diagnosis not present

## 2019-08-09 DIAGNOSIS — E1169 Type 2 diabetes mellitus with other specified complication: Secondary | ICD-10-CM | POA: Diagnosis not present

## 2019-08-16 DIAGNOSIS — E782 Mixed hyperlipidemia: Secondary | ICD-10-CM | POA: Diagnosis not present

## 2019-08-16 DIAGNOSIS — E1169 Type 2 diabetes mellitus with other specified complication: Secondary | ICD-10-CM | POA: Diagnosis not present

## 2019-08-16 DIAGNOSIS — N183 Chronic kidney disease, stage 3 (moderate): Secondary | ICD-10-CM | POA: Diagnosis not present

## 2019-08-27 DIAGNOSIS — R269 Unspecified abnormalities of gait and mobility: Secondary | ICD-10-CM | POA: Diagnosis not present

## 2019-08-27 DIAGNOSIS — F339 Major depressive disorder, recurrent, unspecified: Secondary | ICD-10-CM | POA: Diagnosis not present

## 2019-08-27 DIAGNOSIS — R413 Other amnesia: Secondary | ICD-10-CM | POA: Diagnosis not present

## 2019-08-27 DIAGNOSIS — E782 Mixed hyperlipidemia: Secondary | ICD-10-CM | POA: Diagnosis not present

## 2019-08-27 DIAGNOSIS — Z6830 Body mass index (BMI) 30.0-30.9, adult: Secondary | ICD-10-CM | POA: Diagnosis not present

## 2019-08-27 DIAGNOSIS — E1169 Type 2 diabetes mellitus with other specified complication: Secondary | ICD-10-CM | POA: Diagnosis not present

## 2019-09-13 DIAGNOSIS — G3184 Mild cognitive impairment, so stated: Secondary | ICD-10-CM | POA: Diagnosis not present

## 2019-09-13 DIAGNOSIS — Z23 Encounter for immunization: Secondary | ICD-10-CM | POA: Diagnosis not present

## 2019-09-13 DIAGNOSIS — E538 Deficiency of other specified B group vitamins: Secondary | ICD-10-CM | POA: Diagnosis not present

## 2019-09-13 DIAGNOSIS — N183 Chronic kidney disease, stage 3 (moderate): Secondary | ICD-10-CM | POA: Diagnosis not present

## 2019-09-15 DIAGNOSIS — E782 Mixed hyperlipidemia: Secondary | ICD-10-CM | POA: Diagnosis not present

## 2019-09-15 DIAGNOSIS — G3184 Mild cognitive impairment, so stated: Secondary | ICD-10-CM | POA: Diagnosis not present

## 2019-09-15 DIAGNOSIS — N183 Chronic kidney disease, stage 3 (moderate): Secondary | ICD-10-CM | POA: Diagnosis not present

## 2019-09-15 DIAGNOSIS — E538 Deficiency of other specified B group vitamins: Secondary | ICD-10-CM | POA: Diagnosis not present

## 2019-09-20 DIAGNOSIS — E538 Deficiency of other specified B group vitamins: Secondary | ICD-10-CM | POA: Diagnosis not present

## 2019-09-23 DIAGNOSIS — R262 Difficulty in walking, not elsewhere classified: Secondary | ICD-10-CM | POA: Diagnosis not present

## 2019-09-23 DIAGNOSIS — Z9181 History of falling: Secondary | ICD-10-CM | POA: Diagnosis not present

## 2019-09-23 DIAGNOSIS — M6281 Muscle weakness (generalized): Secondary | ICD-10-CM | POA: Diagnosis not present

## 2019-09-27 DIAGNOSIS — E538 Deficiency of other specified B group vitamins: Secondary | ICD-10-CM | POA: Diagnosis not present

## 2019-09-29 DIAGNOSIS — R262 Difficulty in walking, not elsewhere classified: Secondary | ICD-10-CM | POA: Diagnosis not present

## 2019-09-29 DIAGNOSIS — Z9181 History of falling: Secondary | ICD-10-CM | POA: Diagnosis not present

## 2019-09-29 DIAGNOSIS — M6281 Muscle weakness (generalized): Secondary | ICD-10-CM | POA: Diagnosis not present

## 2019-10-01 DIAGNOSIS — R262 Difficulty in walking, not elsewhere classified: Secondary | ICD-10-CM | POA: Diagnosis not present

## 2019-10-01 DIAGNOSIS — M6281 Muscle weakness (generalized): Secondary | ICD-10-CM | POA: Diagnosis not present

## 2019-10-01 DIAGNOSIS — Z9181 History of falling: Secondary | ICD-10-CM | POA: Diagnosis not present

## 2019-10-04 DIAGNOSIS — Z9181 History of falling: Secondary | ICD-10-CM | POA: Diagnosis not present

## 2019-10-04 DIAGNOSIS — M6281 Muscle weakness (generalized): Secondary | ICD-10-CM | POA: Diagnosis not present

## 2019-10-04 DIAGNOSIS — R262 Difficulty in walking, not elsewhere classified: Secondary | ICD-10-CM | POA: Diagnosis not present

## 2019-10-04 DIAGNOSIS — E538 Deficiency of other specified B group vitamins: Secondary | ICD-10-CM | POA: Diagnosis not present

## 2019-10-15 DIAGNOSIS — E782 Mixed hyperlipidemia: Secondary | ICD-10-CM | POA: Diagnosis not present

## 2019-10-15 DIAGNOSIS — F339 Major depressive disorder, recurrent, unspecified: Secondary | ICD-10-CM | POA: Diagnosis not present

## 2019-10-18 DIAGNOSIS — E538 Deficiency of other specified B group vitamins: Secondary | ICD-10-CM | POA: Diagnosis not present

## 2019-10-18 DIAGNOSIS — G3184 Mild cognitive impairment, so stated: Secondary | ICD-10-CM | POA: Diagnosis not present

## 2019-10-18 DIAGNOSIS — R32 Unspecified urinary incontinence: Secondary | ICD-10-CM | POA: Diagnosis not present

## 2019-10-18 DIAGNOSIS — Z6831 Body mass index (BMI) 31.0-31.9, adult: Secondary | ICD-10-CM | POA: Diagnosis not present

## 2019-10-19 DIAGNOSIS — R262 Difficulty in walking, not elsewhere classified: Secondary | ICD-10-CM | POA: Diagnosis not present

## 2019-10-19 DIAGNOSIS — Z9181 History of falling: Secondary | ICD-10-CM | POA: Diagnosis not present

## 2019-10-19 DIAGNOSIS — M6281 Muscle weakness (generalized): Secondary | ICD-10-CM | POA: Diagnosis not present

## 2019-10-21 DIAGNOSIS — R262 Difficulty in walking, not elsewhere classified: Secondary | ICD-10-CM | POA: Diagnosis not present

## 2019-10-21 DIAGNOSIS — Z9181 History of falling: Secondary | ICD-10-CM | POA: Diagnosis not present

## 2019-10-21 DIAGNOSIS — M6281 Muscle weakness (generalized): Secondary | ICD-10-CM | POA: Diagnosis not present

## 2019-10-22 DIAGNOSIS — Z1231 Encounter for screening mammogram for malignant neoplasm of breast: Secondary | ICD-10-CM | POA: Diagnosis not present

## 2019-10-27 ENCOUNTER — Other Ambulatory Visit: Payer: Self-pay

## 2019-10-27 ENCOUNTER — Ambulatory Visit (INDEPENDENT_AMBULATORY_CARE_PROVIDER_SITE_OTHER): Payer: Medicare Other | Admitting: Sports Medicine

## 2019-10-27 ENCOUNTER — Encounter: Payer: Self-pay | Admitting: Sports Medicine

## 2019-10-27 DIAGNOSIS — B351 Tinea unguium: Secondary | ICD-10-CM | POA: Diagnosis not present

## 2019-10-27 DIAGNOSIS — M79674 Pain in right toe(s): Secondary | ICD-10-CM

## 2019-10-27 DIAGNOSIS — M79675 Pain in left toe(s): Secondary | ICD-10-CM | POA: Diagnosis not present

## 2019-10-27 DIAGNOSIS — I739 Peripheral vascular disease, unspecified: Secondary | ICD-10-CM

## 2019-10-27 DIAGNOSIS — E1140 Type 2 diabetes mellitus with diabetic neuropathy, unspecified: Secondary | ICD-10-CM

## 2019-10-27 NOTE — Progress Notes (Signed)
Subjective: Anna Stone is a 78 y.o. female patient with history of diabetes who presents to office today complaining of long,mildly painful nails while ambulating in shoes; unable to trim. Patient states that the glucose reading this morning was 95 last A1c of 5.  Reports she saw Dr. Nyra Capes x 1 month ago.  No other issues noted.  Patient Active Problem List   Diagnosis Date Noted  . 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 amLODipine (NORVASC) 10 MG tablet Take 10 mg by mouth daily.    Marland Kitchen amLODipine (NORVASC) 5 MG tablet Take 5 mg by mouth daily.    Marland Kitchen aspirin 325 MG tablet Take 325 mg by mouth daily.    . Carboxymethylcellulose Sodium (REFRESH PLUS OP) Place 1 drop into both eyes 2 (two) times daily as needed.    Marland Kitchen EPINEPHrine 0.3 mg/0.3 mL IJ SOAJ injection Inject into the muscle once.    . fluticasone (FLOVENT DISKUS) 50 MCG/BLIST diskus inhaler Inhale 1 puff into the lungs 2 (two) times daily.    . furosemide (LASIX) 20 MG tablet Take 20 mg by mouth daily.     Marland Kitchen gabapentin (NEURONTIN) 600 MG tablet Take by mouth.    . glyBURIDE (DIABETA) 2.5 MG tablet Take 2.5 mg by mouth daily with breakfast.    . hydrochlorothiazide (HYDRODIURIL) 25 MG tablet Take 25 mg by mouth daily.    Marland Kitchen lisinopril-hydrochlorothiazide (PRINZIDE,ZESTORETIC) 20-12.5 MG per tablet Take 1 tablet by mouth daily.    Marland Kitchen MAGNESIUM PO Take by mouth.    . methocarbamol (ROBAXIN) 500 MG tablet Take 1 tablet (500 mg total) by mouth 3 (three) times daily as needed for muscle spasms. 60 tablet 0  . Metoprolol Succinate 25 MG CS24 Take by mouth.    Marland Kitchen omeprazole (PRILOSEC) 20 MG capsule Take 20 mg by mouth daily.    . ondansetron (ZOFRAN) 4 MG tablet Take 1 tablet (4 mg total) by mouth every 8 (eight) hours as needed for nausea or vomiting. 20 tablet 0  . oxyCODONE-acetaminophen (PERCOCET) 10-325 MG tablet Take 1 tablet by mouth every 4 (four) hours as  needed for pain. 60 tablet 0  . pioglitazone (ACTOS) 15 MG tablet Take by mouth.    . promethazine (PHENERGAN) 12.5 MG tablet Take 12.5 mg by mouth every 6 (six) hours as needed for nausea or vomiting.    Marland Kitchen rOPINIRole (REQUIP) 2 MG tablet Take 2 mg by mouth at bedtime.    . rosuvastatin (CRESTOR) 5 MG tablet Take 5 mg by mouth every other day.    . spironolactone (ALDACTONE) 25 MG tablet Take 25 mg by mouth daily.    . traMADol (ULTRAM) 50 MG tablet Take 50 mg by mouth every 6 (six) hours as needed. for pain     No current facility-administered medications on file prior to visit.    Allergies  Allergen Reactions  . Sulfa Antibiotics Itching and Rash  . Adhesive [Tape] Dermatitis    Blisters the skin    No results found for this or any previous visit (from the past 2160 hour(s)).  Objective: General: Patient is awake, alert, and oriented x 3 and in no acute distress.  Integument: Skin is warm, dry and supple bilateral. Nails are tender, long, thickened and  dystrophic with subungual debris, consistent with onychomycosis, bilateral hallux nail all other nails are minimally thickened and discolored. No signs of infection. No open lesions or  preulcerative lesions present bilateral. Remaining integument unremarkable.  Vasculature:  Dorsalis Pedis pulse 1/4 bilateral. Posterior Tibial pulse  0/4 bilateral.  Capillary fill time <3 sec 1-5 bilateral. Positive hair growth to the level of the digits. Temperature gradient decreased bilateral. + varicosities present bilateral.  Trace edema present bilateral.   Neurology: The patient has diminished sensation measured with a 5.07/10g Semmes Weinstein Monofilament at all pedal sites bilateral . Vibratory sensation diminished bilateral with tuning fork. No Babinski sign present bilateral.    Musculoskeletal: Hammertoe pedal deformities noted bilateral and leg deformity from table fall noted on left. Muscular strength acceptable in all lower extremity  muscular groups bilateral without pain on range of motion . No tenderness with calf compression bilateral.  Assessment and Plan: Problem List Items Addressed This Visit    None    Visit Diagnoses    Pain due to onychomycosis of toenails of both feet    -  Primary   Type 2 diabetes mellitus with diabetic neuropathy, unspecified whether long term insulin use ([place])       PVD (peripheral vascular disease) ([place])         -Examined patient. -Re-Discussed and educated patient on diabetic foot care, especially with regards to the vascular, neurological and musculoskeletal systems.  -Mechanically debrided all nails using sterile nail nipper and filed with dremel without incident  -Encourage patient to continue with elevation for edema control -Patient to return  in 3 months for at risk foot care -Patient advised to call the office if any problems or questions arise in the meantime.  Landis Martins, DPM

## 2019-11-01 DIAGNOSIS — R32 Unspecified urinary incontinence: Secondary | ICD-10-CM | POA: Diagnosis not present

## 2019-11-01 DIAGNOSIS — Z6831 Body mass index (BMI) 31.0-31.9, adult: Secondary | ICD-10-CM | POA: Diagnosis not present

## 2019-11-02 DIAGNOSIS — Z9181 History of falling: Secondary | ICD-10-CM | POA: Diagnosis not present

## 2019-11-02 DIAGNOSIS — R262 Difficulty in walking, not elsewhere classified: Secondary | ICD-10-CM | POA: Diagnosis not present

## 2019-11-02 DIAGNOSIS — M6281 Muscle weakness (generalized): Secondary | ICD-10-CM | POA: Diagnosis not present

## 2019-11-04 DIAGNOSIS — R262 Difficulty in walking, not elsewhere classified: Secondary | ICD-10-CM | POA: Diagnosis not present

## 2019-11-04 DIAGNOSIS — Z9181 History of falling: Secondary | ICD-10-CM | POA: Diagnosis not present

## 2019-11-04 DIAGNOSIS — M6281 Muscle weakness (generalized): Secondary | ICD-10-CM | POA: Diagnosis not present

## 2019-11-05 DIAGNOSIS — C50411 Malignant neoplasm of upper-outer quadrant of right female breast: Secondary | ICD-10-CM | POA: Diagnosis not present

## 2019-11-05 DIAGNOSIS — Z853 Personal history of malignant neoplasm of breast: Secondary | ICD-10-CM | POA: Diagnosis not present

## 2019-11-15 DIAGNOSIS — N183 Chronic kidney disease, stage 3 unspecified: Secondary | ICD-10-CM | POA: Diagnosis not present

## 2019-11-15 DIAGNOSIS — E782 Mixed hyperlipidemia: Secondary | ICD-10-CM | POA: Diagnosis not present

## 2019-11-15 DIAGNOSIS — M6281 Muscle weakness (generalized): Secondary | ICD-10-CM | POA: Diagnosis not present

## 2019-11-15 DIAGNOSIS — F339 Major depressive disorder, recurrent, unspecified: Secondary | ICD-10-CM | POA: Diagnosis not present

## 2019-11-15 DIAGNOSIS — G3184 Mild cognitive impairment, so stated: Secondary | ICD-10-CM | POA: Diagnosis not present

## 2019-11-15 DIAGNOSIS — Z9181 History of falling: Secondary | ICD-10-CM | POA: Diagnosis not present

## 2019-11-15 DIAGNOSIS — R262 Difficulty in walking, not elsewhere classified: Secondary | ICD-10-CM | POA: Diagnosis not present

## 2020-01-12 DIAGNOSIS — E1169 Type 2 diabetes mellitus with other specified complication: Secondary | ICD-10-CM | POA: Diagnosis not present

## 2020-01-14 DIAGNOSIS — N183 Chronic kidney disease, stage 3 unspecified: Secondary | ICD-10-CM | POA: Diagnosis not present

## 2020-01-14 DIAGNOSIS — G3184 Mild cognitive impairment, so stated: Secondary | ICD-10-CM | POA: Diagnosis not present

## 2020-01-14 DIAGNOSIS — E782 Mixed hyperlipidemia: Secondary | ICD-10-CM | POA: Diagnosis not present

## 2020-01-14 DIAGNOSIS — F339 Major depressive disorder, recurrent, unspecified: Secondary | ICD-10-CM | POA: Diagnosis not present

## 2020-01-20 DIAGNOSIS — I129 Hypertensive chronic kidney disease with stage 1 through stage 4 chronic kidney disease, or unspecified chronic kidney disease: Secondary | ICD-10-CM | POA: Diagnosis not present

## 2020-01-20 DIAGNOSIS — E1122 Type 2 diabetes mellitus with diabetic chronic kidney disease: Secondary | ICD-10-CM | POA: Diagnosis not present

## 2020-01-20 DIAGNOSIS — E782 Mixed hyperlipidemia: Secondary | ICD-10-CM | POA: Diagnosis not present

## 2020-01-20 DIAGNOSIS — E1169 Type 2 diabetes mellitus with other specified complication: Secondary | ICD-10-CM | POA: Diagnosis not present

## 2020-01-26 ENCOUNTER — Ambulatory Visit: Payer: Medicare Other | Admitting: Sports Medicine

## 2020-01-28 ENCOUNTER — Ambulatory Visit: Payer: Medicare Other | Admitting: Sports Medicine

## 2020-02-13 DIAGNOSIS — E782 Mixed hyperlipidemia: Secondary | ICD-10-CM | POA: Diagnosis not present

## 2020-02-13 DIAGNOSIS — I129 Hypertensive chronic kidney disease with stage 1 through stage 4 chronic kidney disease, or unspecified chronic kidney disease: Secondary | ICD-10-CM | POA: Diagnosis not present

## 2020-02-13 DIAGNOSIS — E1169 Type 2 diabetes mellitus with other specified complication: Secondary | ICD-10-CM | POA: Diagnosis not present

## 2020-02-13 DIAGNOSIS — N183 Chronic kidney disease, stage 3 unspecified: Secondary | ICD-10-CM | POA: Diagnosis not present

## 2020-02-17 ENCOUNTER — Encounter: Payer: Self-pay | Admitting: Sports Medicine

## 2020-02-17 ENCOUNTER — Other Ambulatory Visit: Payer: Self-pay

## 2020-02-17 ENCOUNTER — Ambulatory Visit (INDEPENDENT_AMBULATORY_CARE_PROVIDER_SITE_OTHER): Payer: Medicare Other | Admitting: Sports Medicine

## 2020-02-17 DIAGNOSIS — M79674 Pain in right toe(s): Secondary | ICD-10-CM

## 2020-02-17 DIAGNOSIS — E1140 Type 2 diabetes mellitus with diabetic neuropathy, unspecified: Secondary | ICD-10-CM

## 2020-02-17 DIAGNOSIS — I739 Peripheral vascular disease, unspecified: Secondary | ICD-10-CM | POA: Diagnosis not present

## 2020-02-17 DIAGNOSIS — M79675 Pain in left toe(s): Secondary | ICD-10-CM | POA: Diagnosis not present

## 2020-02-17 DIAGNOSIS — B351 Tinea unguium: Secondary | ICD-10-CM | POA: Diagnosis not present

## 2020-02-17 NOTE — Progress Notes (Signed)
Subjective: Anna Stone is a 79 y.o. female patient with history of diabetes who presents to office today complaining of long,mildly painful nails while ambulating in shoes; unable to trim. Patient states that the glucose reading this morning was 102 last A1c of 5.  Reports she saw Dr. Nyra Capes x 2 months ago.  No other issues noted.  Patient Active Problem List   Diagnosis Date Noted  . 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 amLODipine (NORVASC) 10 MG tablet Take 10 mg by mouth daily.    Marland Kitchen amLODipine (NORVASC) 5 MG tablet Take 5 mg by mouth daily.    Marland Kitchen aspirin 325 MG tablet Take 325 mg by mouth daily.    . Carboxymethylcellulose Sodium (REFRESH PLUS OP) Place 1 drop into both eyes 2 (two) times daily as needed.    Marland Kitchen EPINEPHrine 0.3 mg/0.3 mL IJ SOAJ injection Inject into the muscle once.    . escitalopram (LEXAPRO) 10 MG tablet     . fluticasone (FLOVENT DISKUS) 50 MCG/BLIST diskus inhaler Inhale 1 puff into the lungs 2 (two) times daily.    . furosemide (LASIX) 20 MG tablet Take 20 mg by mouth daily.     Marland Kitchen gabapentin (NEURONTIN) 600 MG tablet Take by mouth.    . glyBURIDE (DIABETA) 2.5 MG tablet Take 2.5 mg by mouth daily with breakfast.    . hydrochlorothiazide (HYDRODIURIL) 25 MG tablet Take 25 mg by mouth daily.    Marland Kitchen lisinopril-hydrochlorothiazide (PRINZIDE,ZESTORETIC) 20-12.5 MG per tablet Take 1 tablet by mouth daily.    Marland Kitchen MAGNESIUM PO Take by mouth.    . methocarbamol (ROBAXIN) 500 MG tablet Take 1 tablet (500 mg total) by mouth 3 (three) times daily as needed for muscle spasms. 60 tablet 0  . Metoprolol Succinate 25 MG CS24 Take by mouth.    Marland Kitchen omeprazole (PRILOSEC) 20 MG capsule Take 20 mg by mouth daily.    . ondansetron (ZOFRAN) 4 MG tablet Take 1 tablet (4 mg total) by mouth every 8 (eight) hours as needed for nausea or vomiting. 20 tablet 0  . oxyCODONE-acetaminophen (PERCOCET) 10-325 MG  tablet Take 1 tablet by mouth every 4 (four) hours as needed for pain. 60 tablet 0  . pioglitazone (ACTOS) 15 MG tablet Take by mouth.    . promethazine (PHENERGAN) 12.5 MG tablet Take 12.5 mg by mouth every 6 (six) hours as needed for nausea or vomiting.    Marland Kitchen rOPINIRole (REQUIP) 2 MG tablet Take 2 mg by mouth at bedtime.    . rosuvastatin (CRESTOR) 5 MG tablet Take 5 mg by mouth every other day.    . spironolactone (ALDACTONE) 25 MG tablet Take 25 mg by mouth daily.    . traMADol (ULTRAM) 50 MG tablet Take 50 mg by mouth every 6 (six) hours as needed. for pain     No current facility-administered medications on file prior to visit.   Allergies  Allergen Reactions  . Sulfa Antibiotics Itching and Rash  . Adhesive [Tape] Dermatitis    Blisters the skin    No results found for this or any previous visit (from the past 2160 hour(s)).  Objective: General: Patient is awake, alert, and oriented x 3 and in no acute distress.  Integument: Skin is warm, dry and supple bilateral. Nails are tender, long, thickened and  dystrophic with subungual debris, consistent with onychomycosis, bilateral hallux nail all other nails are minimally thickened  and discolored. No signs of infection. No open lesions or preulcerative lesions present bilateral. Remaining integument unremarkable.  Vasculature:  Dorsalis Pedis pulse 1/4 bilateral. Posterior Tibial pulse  0/4 bilateral.  Capillary fill time <3 sec 1-5 bilateral. Positive hair growth to the level of the digits. Temperature gradient decreased bilateral. + varicosities present bilateral.  Trace edema present bilateral.   Neurology: The patient has diminished sensation measured with a 5.07/10g Semmes Weinstein Monofilament at all pedal sites bilateral . Vibratory sensation diminished bilateral with tuning fork. No Babinski sign present bilateral.    Musculoskeletal: Hammertoe pedal deformities noted bilateral and leg deformity from table fall noted on left.  Muscular strength acceptable in all lower extremity muscular groups bilateral without pain on range of motion . No tenderness with calf compression bilateral.  Assessment and Plan: Problem List Items Addressed This Visit    None    Visit Diagnoses    Pain due to onychomycosis of toenails of both feet    -  Primary   Type 2 diabetes mellitus with diabetic neuropathy, unspecified whether long term insulin use ([place])       PVD (peripheral vascular disease) ([place])         -Examined patient. -Re-Discussed and educated patient on diabetic foot care, especially with regards to the vascular, neurological and musculoskeletal systems.  -Mechanically debrided all nails using sterile nail nipper and filed with dremel without incident  -Encourage patient to continue with elevation for edema control like previous -Patient to return  in 3 months for at risk foot care -Patient advised to call the office if any problems or questions arise in the meantime.  Landis Martins, DPM

## 2020-03-15 DIAGNOSIS — E1169 Type 2 diabetes mellitus with other specified complication: Secondary | ICD-10-CM | POA: Diagnosis not present

## 2020-03-15 DIAGNOSIS — I129 Hypertensive chronic kidney disease with stage 1 through stage 4 chronic kidney disease, or unspecified chronic kidney disease: Secondary | ICD-10-CM | POA: Diagnosis not present

## 2020-03-15 DIAGNOSIS — N183 Chronic kidney disease, stage 3 unspecified: Secondary | ICD-10-CM | POA: Diagnosis not present

## 2020-03-15 DIAGNOSIS — E782 Mixed hyperlipidemia: Secondary | ICD-10-CM | POA: Diagnosis not present

## 2020-04-05 DIAGNOSIS — Z6831 Body mass index (BMI) 31.0-31.9, adult: Secondary | ICD-10-CM | POA: Diagnosis not present

## 2020-04-05 DIAGNOSIS — R600 Localized edema: Secondary | ICD-10-CM | POA: Diagnosis not present

## 2020-04-05 DIAGNOSIS — I872 Venous insufficiency (chronic) (peripheral): Secondary | ICD-10-CM | POA: Diagnosis not present

## 2020-04-14 DIAGNOSIS — N183 Chronic kidney disease, stage 3 unspecified: Secondary | ICD-10-CM | POA: Diagnosis not present

## 2020-04-14 DIAGNOSIS — E1169 Type 2 diabetes mellitus with other specified complication: Secondary | ICD-10-CM | POA: Diagnosis not present

## 2020-04-14 DIAGNOSIS — I129 Hypertensive chronic kidney disease with stage 1 through stage 4 chronic kidney disease, or unspecified chronic kidney disease: Secondary | ICD-10-CM | POA: Diagnosis not present

## 2020-04-14 DIAGNOSIS — E782 Mixed hyperlipidemia: Secondary | ICD-10-CM | POA: Diagnosis not present

## 2020-04-20 DIAGNOSIS — M170 Bilateral primary osteoarthritis of knee: Secondary | ICD-10-CM | POA: Diagnosis not present

## 2020-05-11 DIAGNOSIS — E1122 Type 2 diabetes mellitus with diabetic chronic kidney disease: Secondary | ICD-10-CM | POA: Diagnosis not present

## 2020-05-15 DIAGNOSIS — I129 Hypertensive chronic kidney disease with stage 1 through stage 4 chronic kidney disease, or unspecified chronic kidney disease: Secondary | ICD-10-CM | POA: Diagnosis not present

## 2020-05-15 DIAGNOSIS — E782 Mixed hyperlipidemia: Secondary | ICD-10-CM | POA: Diagnosis not present

## 2020-05-15 DIAGNOSIS — N183 Chronic kidney disease, stage 3 unspecified: Secondary | ICD-10-CM | POA: Diagnosis not present

## 2020-05-15 DIAGNOSIS — E1169 Type 2 diabetes mellitus with other specified complication: Secondary | ICD-10-CM | POA: Diagnosis not present

## 2020-05-18 DIAGNOSIS — I129 Hypertensive chronic kidney disease with stage 1 through stage 4 chronic kidney disease, or unspecified chronic kidney disease: Secondary | ICD-10-CM | POA: Diagnosis not present

## 2020-05-18 DIAGNOSIS — N183 Chronic kidney disease, stage 3 unspecified: Secondary | ICD-10-CM | POA: Diagnosis not present

## 2020-05-18 DIAGNOSIS — E1122 Type 2 diabetes mellitus with diabetic chronic kidney disease: Secondary | ICD-10-CM | POA: Diagnosis not present

## 2020-05-18 DIAGNOSIS — E1169 Type 2 diabetes mellitus with other specified complication: Secondary | ICD-10-CM | POA: Diagnosis not present

## 2020-05-19 ENCOUNTER — Other Ambulatory Visit: Payer: Self-pay

## 2020-05-19 ENCOUNTER — Ambulatory Visit (INDEPENDENT_AMBULATORY_CARE_PROVIDER_SITE_OTHER): Payer: Medicare Other | Admitting: Sports Medicine

## 2020-05-19 ENCOUNTER — Encounter: Payer: Self-pay | Admitting: Sports Medicine

## 2020-05-19 DIAGNOSIS — Z789 Other specified health status: Secondary | ICD-10-CM | POA: Diagnosis not present

## 2020-05-19 DIAGNOSIS — M79674 Pain in right toe(s): Secondary | ICD-10-CM | POA: Diagnosis not present

## 2020-05-19 DIAGNOSIS — M79675 Pain in left toe(s): Secondary | ICD-10-CM

## 2020-05-19 DIAGNOSIS — I739 Peripheral vascular disease, unspecified: Secondary | ICD-10-CM | POA: Diagnosis not present

## 2020-05-19 DIAGNOSIS — E1140 Type 2 diabetes mellitus with diabetic neuropathy, unspecified: Secondary | ICD-10-CM | POA: Diagnosis not present

## 2020-05-19 DIAGNOSIS — B351 Tinea unguium: Secondary | ICD-10-CM | POA: Diagnosis not present

## 2020-05-19 NOTE — Progress Notes (Signed)
Subjective: Anna Stone is a 79 y.o. female patient with history of diabetes who presents to office today complaining of long,mildly painful nails while ambulating in shoes; unable to trim. Patient states that the glucose reading this morning was 103 last A1c of 6.  Reports she saw Dr. Nyra Capes yesterday. Issues with cholesterol but otherwise visit with PCP went ok.  No other issues noted.  Patient Active Problem List   Diagnosis Date Noted  . 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 amLODipine (NORVASC) 10 MG tablet Take 10 mg by mouth daily.    Marland Kitchen amLODipine (NORVASC) 5 MG tablet Take 5 mg by mouth daily.    Marland Kitchen aspirin 325 MG tablet Take 325 mg by mouth daily.    . Carboxymethylcellulose Sodium (REFRESH PLUS OP) Place 1 drop into both eyes 2 (two) times daily as needed.    Marland Kitchen EPINEPHrine 0.3 mg/0.3 mL IJ SOAJ injection Inject into the muscle once.    . escitalopram (LEXAPRO) 10 MG tablet     . fluticasone (FLOVENT DISKUS) 50 MCG/BLIST diskus inhaler Inhale 1 puff into the lungs 2 (two) times daily.    . furosemide (LASIX) 20 MG tablet Take 20 mg by mouth daily.     Marland Kitchen gabapentin (NEURONTIN) 600 MG tablet Take by mouth.    . glyBURIDE (DIABETA) 2.5 MG tablet Take 2.5 mg by mouth daily with breakfast.    . hydrochlorothiazide (HYDRODIURIL) 25 MG tablet Take 25 mg by mouth daily.    Marland Kitchen lisinopril-hydrochlorothiazide (PRINZIDE,ZESTORETIC) 20-12.5 MG per tablet Take 1 tablet by mouth daily.    Marland Kitchen MAGNESIUM PO Take by mouth.    . methocarbamol (ROBAXIN) 500 MG tablet Take 1 tablet (500 mg total) by mouth 3 (three) times daily as needed for muscle spasms. 60 tablet 0  . Metoprolol Succinate 25 MG CS24 Take by mouth.    Marland Kitchen omeprazole (PRILOSEC) 20 MG capsule Take 20 mg by mouth daily.    . ondansetron (ZOFRAN) 4 MG tablet Take 1 tablet (4 mg total) by mouth every 8 (eight) hours as needed for nausea or vomiting. 20  tablet 0  . oxyCODONE-acetaminophen (PERCOCET) 10-325 MG tablet Take 1 tablet by mouth every 4 (four) hours as needed for pain. 60 tablet 0  . pioglitazone (ACTOS) 15 MG tablet Take by mouth.    . promethazine (PHENERGAN) 12.5 MG tablet Take 12.5 mg by mouth every 6 (six) hours as needed for nausea or vomiting.    Marland Kitchen rOPINIRole (REQUIP) 2 MG tablet Take 2 mg by mouth at bedtime.    . rosuvastatin (CRESTOR) 5 MG tablet Take 5 mg by mouth every other day.    . spironolactone (ALDACTONE) 25 MG tablet Take 25 mg by mouth daily.    . traMADol (ULTRAM) 50 MG tablet Take 50 mg by mouth every 6 (six) hours as needed. for pain     No current facility-administered medications on file prior to visit.   Allergies  Allergen Reactions  . Sulfa Antibiotics Itching and Rash  . Adhesive [Tape] Dermatitis    Blisters the skin    No results found for this or any previous visit (from the past 2160 hour(s)).  Objective: General: Patient is awake, alert, and oriented x 3 and in no acute distress.  Integument: Skin is warm, dry and supple bilateral. Nails are tender, long, thickened and dystrophic with subungual debris, consistent with onychomycosis, bilateral hallux nail  all other nails are minimally thickened and discolored. No signs of infection. No open lesions or preulcerative lesions present bilateral. Remaining integument unremarkable.  Vasculature:  Dorsalis Pedis pulse 1/4 bilateral. Posterior Tibial pulse  0/4 bilateral. Capillary fill time <3 sec 1-5 bilateral. Positive hair growth to the level of the digits. Temperature gradient decreased bilateral. + varicosities present bilateral.  Trace edema present bilateral.   Neurology: The patient has diminished sensation measured with a 5.07/10g Semmes Weinstein Monofilament at all pedal sites bilateral . Vibratory sensation diminished bilateral with tuning fork. No Babinski sign present bilateral.    Musculoskeletal: Hammertoe pedal deformities noted  bilateral and leg deformity from table fall noted on left. Muscular strength acceptable in all lower extremity muscular groups bilateral without pain on range of motion . No tenderness with calf compression bilateral.  Assessment and Plan: Problem List Items Addressed This Visit    None    Visit Diagnoses    Pain due to onychomycosis of toenails of both feet    -  Primary   Type 2 diabetes mellitus with diabetic neuropathy, unspecified whether long term insulin use ([facility])       PVD (peripheral vascular disease) ([place])         -Examined patient. -Re-Discussed and educated patient on diabetic foot care -Mechanically debrided all nails using sterile nail nipper and filed with dremel without incident  -Encourage patient to continue with elevation for edema control like previous and wear her compression stockings as ordered by PCP -Patient to return  in 3 months for at risk foot care -Patient advised to call the office if any problems or questions arise in the meantime.  Landis Martins, DPM

## 2020-06-05 DIAGNOSIS — N183 Chronic kidney disease, stage 3 unspecified: Secondary | ICD-10-CM | POA: Diagnosis not present

## 2020-06-05 DIAGNOSIS — E1122 Type 2 diabetes mellitus with diabetic chronic kidney disease: Secondary | ICD-10-CM | POA: Diagnosis not present

## 2020-06-05 DIAGNOSIS — I129 Hypertensive chronic kidney disease with stage 1 through stage 4 chronic kidney disease, or unspecified chronic kidney disease: Secondary | ICD-10-CM | POA: Diagnosis not present

## 2020-06-22 DIAGNOSIS — E113292 Type 2 diabetes mellitus with mild nonproliferative diabetic retinopathy without macular edema, left eye: Secondary | ICD-10-CM | POA: Diagnosis not present

## 2020-07-07 DIAGNOSIS — I872 Venous insufficiency (chronic) (peripheral): Secondary | ICD-10-CM | POA: Diagnosis not present

## 2020-07-07 DIAGNOSIS — N183 Chronic kidney disease, stage 3 unspecified: Secondary | ICD-10-CM | POA: Diagnosis not present

## 2020-07-07 DIAGNOSIS — E1122 Type 2 diabetes mellitus with diabetic chronic kidney disease: Secondary | ICD-10-CM | POA: Diagnosis not present

## 2020-07-07 DIAGNOSIS — I129 Hypertensive chronic kidney disease with stage 1 through stage 4 chronic kidney disease, or unspecified chronic kidney disease: Secondary | ICD-10-CM | POA: Diagnosis not present

## 2020-07-07 DIAGNOSIS — D692 Other nonthrombocytopenic purpura: Secondary | ICD-10-CM | POA: Diagnosis not present

## 2020-07-12 DIAGNOSIS — M24542 Contracture, left hand: Secondary | ICD-10-CM | POA: Diagnosis not present

## 2020-07-12 DIAGNOSIS — M79644 Pain in right finger(s): Secondary | ICD-10-CM | POA: Diagnosis not present

## 2020-07-12 DIAGNOSIS — M79645 Pain in left finger(s): Secondary | ICD-10-CM | POA: Diagnosis not present

## 2020-07-12 DIAGNOSIS — M720 Palmar fascial fibromatosis [Dupuytren]: Secondary | ICD-10-CM | POA: Diagnosis not present

## 2020-07-12 DIAGNOSIS — M24541 Contracture, right hand: Secondary | ICD-10-CM | POA: Diagnosis not present

## 2020-07-15 DIAGNOSIS — I129 Hypertensive chronic kidney disease with stage 1 through stage 4 chronic kidney disease, or unspecified chronic kidney disease: Secondary | ICD-10-CM | POA: Diagnosis not present

## 2020-07-16 DIAGNOSIS — N183 Chronic kidney disease, stage 3 unspecified: Secondary | ICD-10-CM | POA: Diagnosis not present

## 2020-07-16 DIAGNOSIS — D692 Other nonthrombocytopenic purpura: Secondary | ICD-10-CM | POA: Diagnosis not present

## 2020-07-16 DIAGNOSIS — I129 Hypertensive chronic kidney disease with stage 1 through stage 4 chronic kidney disease, or unspecified chronic kidney disease: Secondary | ICD-10-CM | POA: Diagnosis not present

## 2020-07-16 DIAGNOSIS — E1122 Type 2 diabetes mellitus with diabetic chronic kidney disease: Secondary | ICD-10-CM | POA: Diagnosis not present

## 2020-07-18 DIAGNOSIS — M24541 Contracture, right hand: Secondary | ICD-10-CM | POA: Diagnosis not present

## 2020-07-18 DIAGNOSIS — M25641 Stiffness of right hand, not elsewhere classified: Secondary | ICD-10-CM | POA: Diagnosis not present

## 2020-07-18 DIAGNOSIS — M24542 Contracture, left hand: Secondary | ICD-10-CM | POA: Diagnosis not present

## 2020-07-18 DIAGNOSIS — M25642 Stiffness of left hand, not elsewhere classified: Secondary | ICD-10-CM | POA: Diagnosis not present

## 2020-07-18 DIAGNOSIS — M720 Palmar fascial fibromatosis [Dupuytren]: Secondary | ICD-10-CM | POA: Diagnosis not present

## 2020-07-21 DIAGNOSIS — Z1331 Encounter for screening for depression: Secondary | ICD-10-CM | POA: Diagnosis not present

## 2020-07-21 DIAGNOSIS — R110 Nausea: Secondary | ICD-10-CM | POA: Diagnosis not present

## 2020-07-21 DIAGNOSIS — Z0000 Encounter for general adult medical examination without abnormal findings: Secondary | ICD-10-CM | POA: Diagnosis not present

## 2020-07-21 DIAGNOSIS — Z136 Encounter for screening for cardiovascular disorders: Secondary | ICD-10-CM | POA: Diagnosis not present

## 2020-07-21 DIAGNOSIS — Z1339 Encounter for screening examination for other mental health and behavioral disorders: Secondary | ICD-10-CM | POA: Diagnosis not present

## 2020-07-21 DIAGNOSIS — E669 Obesity, unspecified: Secondary | ICD-10-CM | POA: Diagnosis not present

## 2020-07-21 DIAGNOSIS — R531 Weakness: Secondary | ICD-10-CM | POA: Diagnosis not present

## 2020-07-21 DIAGNOSIS — R609 Edema, unspecified: Secondary | ICD-10-CM | POA: Diagnosis not present

## 2020-07-21 DIAGNOSIS — Z139 Encounter for screening, unspecified: Secondary | ICD-10-CM | POA: Diagnosis not present

## 2020-07-25 DIAGNOSIS — R700 Elevated erythrocyte sedimentation rate: Secondary | ICD-10-CM | POA: Diagnosis not present

## 2020-07-27 DIAGNOSIS — M170 Bilateral primary osteoarthritis of knee: Secondary | ICD-10-CM | POA: Diagnosis not present

## 2020-08-04 DIAGNOSIS — J3489 Other specified disorders of nose and nasal sinuses: Secondary | ICD-10-CM | POA: Diagnosis not present

## 2020-08-04 DIAGNOSIS — Z20828 Contact with and (suspected) exposure to other viral communicable diseases: Secondary | ICD-10-CM | POA: Diagnosis not present

## 2020-08-14 DIAGNOSIS — I129 Hypertensive chronic kidney disease with stage 1 through stage 4 chronic kidney disease, or unspecified chronic kidney disease: Secondary | ICD-10-CM | POA: Diagnosis not present

## 2020-08-15 DIAGNOSIS — I129 Hypertensive chronic kidney disease with stage 1 through stage 4 chronic kidney disease, or unspecified chronic kidney disease: Secondary | ICD-10-CM | POA: Diagnosis not present

## 2020-08-15 DIAGNOSIS — E1122 Type 2 diabetes mellitus with diabetic chronic kidney disease: Secondary | ICD-10-CM | POA: Diagnosis not present

## 2020-08-15 DIAGNOSIS — N183 Chronic kidney disease, stage 3 unspecified: Secondary | ICD-10-CM | POA: Diagnosis not present

## 2020-08-17 DIAGNOSIS — E1122 Type 2 diabetes mellitus with diabetic chronic kidney disease: Secondary | ICD-10-CM | POA: Diagnosis not present

## 2020-08-18 DIAGNOSIS — N183 Chronic kidney disease, stage 3 unspecified: Secondary | ICD-10-CM | POA: Diagnosis not present

## 2020-08-18 DIAGNOSIS — M329 Systemic lupus erythematosus, unspecified: Secondary | ICD-10-CM | POA: Diagnosis not present

## 2020-08-18 DIAGNOSIS — Z6830 Body mass index (BMI) 30.0-30.9, adult: Secondary | ICD-10-CM | POA: Diagnosis not present

## 2020-08-18 DIAGNOSIS — I129 Hypertensive chronic kidney disease with stage 1 through stage 4 chronic kidney disease, or unspecified chronic kidney disease: Secondary | ICD-10-CM | POA: Diagnosis not present

## 2020-08-24 DIAGNOSIS — M329 Systemic lupus erythematosus, unspecified: Secondary | ICD-10-CM | POA: Diagnosis not present

## 2020-08-24 DIAGNOSIS — E1122 Type 2 diabetes mellitus with diabetic chronic kidney disease: Secondary | ICD-10-CM | POA: Diagnosis not present

## 2020-08-24 DIAGNOSIS — E1169 Type 2 diabetes mellitus with other specified complication: Secondary | ICD-10-CM | POA: Diagnosis not present

## 2020-08-24 DIAGNOSIS — E782 Mixed hyperlipidemia: Secondary | ICD-10-CM | POA: Diagnosis not present

## 2020-08-31 ENCOUNTER — Inpatient Hospital Stay (HOSPITAL_COMMUNITY)
Admission: EM | Admit: 2020-08-31 | Discharge: 2020-09-03 | DRG: 872 | Disposition: A | Discharge status: Home Health | Payer: Medicare Other | Source: Ambulatory Visit | Attending: Family Medicine | Admitting: Family Medicine

## 2020-08-31 ENCOUNTER — Ambulatory Visit: Payer: Medicare Other | Admitting: Podiatry

## 2020-08-31 ENCOUNTER — Encounter (HOSPITAL_COMMUNITY): Payer: Self-pay | Admitting: Family Medicine

## 2020-08-31 ENCOUNTER — Emergency Department (HOSPITAL_COMMUNITY): Payer: Medicare Other

## 2020-08-31 DIAGNOSIS — K6389 Other specified diseases of intestine: Secondary | ICD-10-CM | POA: Diagnosis not present

## 2020-08-31 DIAGNOSIS — I129 Hypertensive chronic kidney disease with stage 1 through stage 4 chronic kidney disease, or unspecified chronic kidney disease: Secondary | ICD-10-CM | POA: Diagnosis present

## 2020-08-31 DIAGNOSIS — A419 Sepsis, unspecified organism: Secondary | ICD-10-CM | POA: Diagnosis not present

## 2020-08-31 DIAGNOSIS — K529 Noninfective gastroenteritis and colitis, unspecified: Secondary | ICD-10-CM | POA: Diagnosis not present

## 2020-08-31 DIAGNOSIS — R112 Nausea with vomiting, unspecified: Secondary | ICD-10-CM | POA: Diagnosis not present

## 2020-08-31 DIAGNOSIS — N179 Acute kidney failure, unspecified: Secondary | ICD-10-CM | POA: Diagnosis not present

## 2020-08-31 DIAGNOSIS — E1122 Type 2 diabetes mellitus with diabetic chronic kidney disease: Secondary | ICD-10-CM | POA: Diagnosis present

## 2020-08-31 DIAGNOSIS — Z7984 Long term (current) use of oral hypoglycemic drugs: Secondary | ICD-10-CM

## 2020-08-31 DIAGNOSIS — N183 Chronic kidney disease, stage 3 unspecified: Secondary | ICD-10-CM | POA: Diagnosis present

## 2020-08-31 DIAGNOSIS — F329 Major depressive disorder, single episode, unspecified: Secondary | ICD-10-CM | POA: Diagnosis present

## 2020-08-31 DIAGNOSIS — R159 Full incontinence of feces: Secondary | ICD-10-CM | POA: Diagnosis present

## 2020-08-31 DIAGNOSIS — Z79899 Other long term (current) drug therapy: Secondary | ICD-10-CM | POA: Diagnosis not present

## 2020-08-31 DIAGNOSIS — Z7982 Long term (current) use of aspirin: Secondary | ICD-10-CM

## 2020-08-31 DIAGNOSIS — R918 Other nonspecific abnormal finding of lung field: Secondary | ICD-10-CM | POA: Diagnosis not present

## 2020-08-31 DIAGNOSIS — I959 Hypotension, unspecified: Secondary | ICD-10-CM | POA: Diagnosis not present

## 2020-08-31 DIAGNOSIS — Z20822 Contact with and (suspected) exposure to covid-19: Secondary | ICD-10-CM | POA: Diagnosis present

## 2020-08-31 DIAGNOSIS — N3000 Acute cystitis without hematuria: Secondary | ICD-10-CM | POA: Diagnosis not present

## 2020-08-31 DIAGNOSIS — Z9049 Acquired absence of other specified parts of digestive tract: Secondary | ICD-10-CM | POA: Diagnosis not present

## 2020-08-31 DIAGNOSIS — K219 Gastro-esophageal reflux disease without esophagitis: Secondary | ICD-10-CM | POA: Diagnosis present

## 2020-08-31 DIAGNOSIS — A09 Infectious gastroenteritis and colitis, unspecified: Secondary | ICD-10-CM | POA: Diagnosis present

## 2020-08-31 DIAGNOSIS — R531 Weakness: Secondary | ICD-10-CM | POA: Diagnosis not present

## 2020-08-31 DIAGNOSIS — E876 Hypokalemia: Secondary | ICD-10-CM | POA: Diagnosis not present

## 2020-08-31 DIAGNOSIS — R5383 Other fatigue: Secondary | ICD-10-CM | POA: Diagnosis not present

## 2020-08-31 DIAGNOSIS — I10 Essential (primary) hypertension: Secondary | ICD-10-CM | POA: Diagnosis not present

## 2020-08-31 DIAGNOSIS — Z90710 Acquired absence of both cervix and uterus: Secondary | ICD-10-CM

## 2020-08-31 DIAGNOSIS — J42 Unspecified chronic bronchitis: Secondary | ICD-10-CM | POA: Diagnosis not present

## 2020-08-31 DIAGNOSIS — I700 Atherosclerosis of aorta: Secondary | ICD-10-CM | POA: Diagnosis not present

## 2020-08-31 DIAGNOSIS — R000 Tachycardia, unspecified: Secondary | ICD-10-CM | POA: Diagnosis not present

## 2020-08-31 DIAGNOSIS — R6520 Severe sepsis without septic shock: Secondary | ICD-10-CM | POA: Diagnosis present

## 2020-08-31 DIAGNOSIS — R768 Other specified abnormal immunological findings in serum: Secondary | ICD-10-CM | POA: Diagnosis not present

## 2020-08-31 DIAGNOSIS — E860 Dehydration: Secondary | ICD-10-CM | POA: Diagnosis not present

## 2020-08-31 DIAGNOSIS — J8489 Other specified interstitial pulmonary diseases: Secondary | ICD-10-CM | POA: Diagnosis not present

## 2020-08-31 DIAGNOSIS — I472 Ventricular tachycardia: Secondary | ICD-10-CM | POA: Diagnosis not present

## 2020-08-31 DIAGNOSIS — I152 Hypertension secondary to endocrine disorders: Secondary | ICD-10-CM

## 2020-08-31 HISTORY — DX: Depression, unspecified: F32.A

## 2020-08-31 LAB — CBC
HCT: 47.5 % — ABNORMAL HIGH (ref 36.0–46.0)
Hemoglobin: 15.7 g/dL — ABNORMAL HIGH (ref 12.0–15.0)
MCH: 30 pg (ref 26.0–34.0)
MCHC: 33.1 g/dL (ref 30.0–36.0)
MCV: 90.6 fL (ref 80.0–100.0)
Platelets: 166 10*3/uL (ref 150–400)
RBC: 5.24 MIL/uL — ABNORMAL HIGH (ref 3.87–5.11)
RDW: 13 % (ref 11.5–15.5)
WBC: 18.4 10*3/uL — ABNORMAL HIGH (ref 4.0–10.5)
nRBC: 0.1 % (ref 0.0–0.2)

## 2020-08-31 LAB — URINALYSIS, ROUTINE W REFLEX MICROSCOPIC
Glucose, UA: NEGATIVE mg/dL
Hgb urine dipstick: NEGATIVE
Ketones, ur: 5 mg/dL — AB
Leukocytes,Ua: NEGATIVE
Nitrite: NEGATIVE
Protein, ur: 30 mg/dL — AB
Specific Gravity, Urine: 1.025 (ref 1.005–1.030)
pH: 5 (ref 5.0–8.0)

## 2020-08-31 LAB — COMPREHENSIVE METABOLIC PANEL
ALT: 35 U/L (ref 0–44)
AST: 33 U/L (ref 15–41)
Albumin: 3.3 g/dL — ABNORMAL LOW (ref 3.5–5.0)
Alkaline Phosphatase: 47 U/L (ref 38–126)
Anion gap: 17 — ABNORMAL HIGH (ref 5–15)
BUN: 34 mg/dL — ABNORMAL HIGH (ref 8–23)
CO2: 25 mmol/L (ref 22–32)
Calcium: 8.5 mg/dL — ABNORMAL LOW (ref 8.9–10.3)
Chloride: 92 mmol/L — ABNORMAL LOW (ref 98–111)
Creatinine, Ser: 2.03 mg/dL — ABNORMAL HIGH (ref 0.44–1.00)
GFR calc Af Amer: 26 mL/min — ABNORMAL LOW (ref >60)
GFR calc non Af Amer: 22 mL/min — ABNORMAL LOW (ref >60)
Glucose, Bld: 209 mg/dL — ABNORMAL HIGH (ref 70–99)
Potassium: 4 mmol/L (ref 3.5–5.1)
Sodium: 134 mmol/L — ABNORMAL LOW (ref 135–145)
Total Bilirubin: 1.5 mg/dL — ABNORMAL HIGH (ref 0.3–1.2)
Total Protein: 5.7 g/dL — ABNORMAL LOW (ref 6.5–8.1)

## 2020-08-31 LAB — APTT: aPTT: <20 seconds — ABNORMAL LOW (ref 24–36)

## 2020-08-31 LAB — SARS CORONAVIRUS 2 BY RT PCR (HOSPITAL ORDER, PERFORMED IN ~~LOC~~ HOSPITAL LAB): SARS Coronavirus 2: NEGATIVE

## 2020-08-31 LAB — PROTIME-INR
INR: 1.1 (ref 0.8–1.2)
Prothrombin Time: 13.2 seconds (ref 11.4–15.2)

## 2020-08-31 LAB — LACTIC ACID, PLASMA
Lactic Acid, Venous: 3.6 mmol/L (ref 0.5–1.9)
Lactic Acid, Venous: 1.5 mmol/L (ref 0.5–1.9)

## 2020-08-31 LAB — LIPASE, BLOOD: Lipase: 33 U/L (ref 11–51)

## 2020-08-31 MED ORDER — METRONIDAZOLE IN NACL 5-0.79 MG/ML-% IV SOLN
500.0000 mg | Freq: Three times a day (TID) | INTRAVENOUS | Status: DC
  Administered 2020-09-01 – 2020-09-03 (×8): 500 mg via INTRAVENOUS
  Filled 2020-08-31 (×8): qty 100

## 2020-08-31 MED ORDER — LACTATED RINGERS IV BOLUS (SEPSIS)
1000.0000 mL | Freq: Once | INTRAVENOUS | Status: AC
  Administered 2020-08-31: 1000 mL via INTRAVENOUS

## 2020-08-31 MED ORDER — SODIUM CHLORIDE 0.9 % IV SOLN
2.0000 g | INTRAVENOUS | Status: DC
  Administered 2020-09-01 – 2020-09-02 (×3): 2 g via INTRAVENOUS
  Filled 2020-08-31: qty 2
  Filled 2020-08-31: qty 20
  Filled 2020-08-31: qty 2
  Filled 2020-08-31: qty 20

## 2020-08-31 MED ORDER — HEPARIN SODIUM (PORCINE) 5000 UNIT/ML IJ SOLN
5000.0000 [IU] | Freq: Three times a day (TID) | INTRAMUSCULAR | Status: DC
  Administered 2020-09-01 – 2020-09-03 (×8): 5000 [IU] via SUBCUTANEOUS
  Filled 2020-08-31 (×8): qty 1

## 2020-08-31 MED ORDER — AMLODIPINE BESYLATE 10 MG PO TABS
10.0000 mg | ORAL_TABLET | Freq: Every day | ORAL | Status: DC
  Administered 2020-09-01 – 2020-09-03 (×3): 10 mg via ORAL
  Filled 2020-08-31: qty 2
  Filled 2020-08-31 (×2): qty 1

## 2020-08-31 MED ORDER — SODIUM CHLORIDE 0.9 % IV BOLUS: 1000.0000 mL | Freq: Once | INTRAVENOUS | Status: DC

## 2020-08-31 MED ORDER — ONDANSETRON HCL 4 MG/2ML IJ SOLN: 4.0000 mg | Freq: Four times a day (QID) | INTRAMUSCULAR | Status: DC | PRN

## 2020-08-31 MED ORDER — ONDANSETRON HCL 4 MG PO TABS: 4.0000 mg | ORAL_TABLET | Freq: Four times a day (QID) | ORAL | Status: DC | PRN

## 2020-08-31 MED ORDER — INSULIN ASPART 100 UNIT/ML ~~LOC~~ SOLN: 0.0000 [IU] | Freq: Three times a day (TID) | SUBCUTANEOUS | Status: DC

## 2020-08-31 MED ORDER — CIPROFLOXACIN IN D5W 400 MG/200ML IV SOLN: 400.0000 mg | Freq: Once | INTRAVENOUS | Status: DC

## 2020-08-31 MED ORDER — METRONIDAZOLE IN NACL 5-0.79 MG/ML-% IV SOLN: 500.0000 mg | Freq: Once | INTRAVENOUS | Status: DC

## 2020-08-31 MED ORDER — LISINOPRIL-HYDROCHLOROTHIAZIDE 20-12.5 MG PO TABS: 1.0000 | ORAL_TABLET | Freq: Every day | ORAL | Status: DC

## 2020-08-31 MED ORDER — ROSUVASTATIN CALCIUM 5 MG PO TABS
5.0000 mg | ORAL_TABLET | ORAL | Status: DC
  Administered 2020-09-01 – 2020-09-03 (×2): 5 mg via ORAL
  Filled 2020-08-31 (×2): qty 1

## 2020-08-31 MED ORDER — ASPIRIN 325 MG PO TABS
325.0000 mg | ORAL_TABLET | Freq: Every day | ORAL | Status: DC
  Administered 2020-09-01 – 2020-09-03 (×2): 325 mg via ORAL
  Filled 2020-08-31 (×3): qty 1

## 2020-08-31 MED ORDER — SODIUM CHLORIDE 0.9 % IV SOLN
1.0000 g | INTRAVENOUS | Status: DC
  Administered 2020-08-31: 1 g via INTRAVENOUS
  Filled 2020-08-31 (×2): qty 10

## 2020-08-31 MED ORDER — ACETAMINOPHEN 650 MG RE SUPP: 650.0000 mg | Freq: Four times a day (QID) | RECTAL | Status: DC | PRN

## 2020-08-31 MED ORDER — LACTATED RINGERS IV BOLUS (SEPSIS)
250.0000 mL | Freq: Once | INTRAVENOUS | Status: AC
  Administered 2020-08-31: 250 mL via INTRAVENOUS

## 2020-08-31 MED ORDER — INSULIN ASPART 100 UNIT/ML ~~LOC~~ SOLN: 0.0000 [IU] | Freq: Every day | SUBCUTANEOUS | Status: DC

## 2020-08-31 MED ORDER — ACETAMINOPHEN 325 MG PO TABS
650.0000 mg | ORAL_TABLET | Freq: Four times a day (QID) | ORAL | Status: DC | PRN
  Administered 2020-09-01 – 2020-09-03 (×2): 650 mg via ORAL
  Filled 2020-08-31 (×3): qty 2

## 2020-08-31 MED ORDER — METOPROLOL SUCCINATE ER 25 MG PO TB24
25.0000 mg | ORAL_TABLET | Freq: Every day | ORAL | Status: DC
  Administered 2020-09-01 – 2020-09-03 (×3): 25 mg via ORAL
  Filled 2020-08-31 (×3): qty 1

## 2020-08-31 MED ORDER — LACTATED RINGERS IV SOLN: INTRAVENOUS | Status: DC

## 2020-08-31 NOTE — ED Notes (Signed)
Radonna Ricker Pritchard (granddaughter) called and would like an update on pt. Please call [phone]

## 2020-08-31 NOTE — ED Notes (Signed)
Urine culture sent with urine

## 2020-08-31 NOTE — ED Notes (Signed)
purewick placed on patient.  

## 2020-08-31 NOTE — ED Provider Notes (Signed)
[facility] EMERGENCY DEPARTMENT Provider Note   CSN: [id number] Arrival date & time: 08/31/20  1527     History No chief complaint on file.   Anna Stone is a 79 y.o. female.  Pt presents to the ED today with weakness.  Pt has felt bad over the past 2 weeks.  It is worse over the last few days.  She called her pcp who sent her in for eval.  Pt has not been vaccinated against Covid.          Past Medical History:  Diagnosis Date  . Cancer ([place])   . Chronic kidney disease    stage 3  . Diabetes mellitus without complication ([place])   . Difficulty breathing   . GERD (gastroesophageal reflux disease)   . Hypertension   . IBS (irritable bowel syndrome)   . Muscle pain   . Palpitation   . PONV (postoperative nausea and vomiting)   . Swelling     Patient Active Problem List   Diagnosis Date Noted  . Synovial cyst 10/30/2016    Past Surgical History:  Procedure Laterality Date  . ABDOMINAL HYSTERECTOMY    . ANKLE FRACTURE SURGERY Right   . BREAST SURGERY     right  . CHOLECYSTECTOMY    . DECOMPRESSIVE LUMBAR LAMINECTOMY LEVEL 1 Right 10/30/2016   Procedure: RIGHT L5-S1 DECOMPRESSION, EXCISION OF CYST LEVEL 1;  Surgeon: Melina Schools, MD;  Location: [facility];  Service: Orthopedics;  Laterality: Right;  . OVARY SURGERY       OB History   No obstetric history on file.     No family history on file.  Social History   Tobacco Use  . Smoking status: Never Smoker  . Smokeless tobacco: Never Used  Substance Use Topics  . Alcohol use: No  . Drug use: No    Home Medications Prior to Admission medications   Medication Sig Start Date End Date Taking? Authorizing Provider  amLODipine (NORVASC) 10 MG tablet Take 10 mg by mouth daily.    [provider]  amLODipine (NORVASC) 5 MG tablet Take 5 mg by mouth daily. 04/02/19   [provider]  aspirin 325 MG tablet Take 325 mg by mouth daily.    [provider]    Carboxymethylcellulose Sodium (REFRESH PLUS OP) Place 1 drop into both eyes 2 (two) times daily as needed.    [provider]  EPINEPHrine 0.3 mg/0.3 mL IJ SOAJ injection Inject into the muscle once.    [provider]  escitalopram (LEXAPRO) 10 MG tablet  11/02/19   [provider]  fluticasone (FLOVENT DISKUS) 50 MCG/BLIST diskus inhaler Inhale 1 puff into the lungs 2 (two) times daily.    [provider]  furosemide (LASIX) 20 MG tablet Take 20 mg by mouth daily.     [provider]  gabapentin (NEURONTIN) 600 MG tablet Take by mouth.    [provider]  glyBURIDE (DIABETA) 2.5 MG tablet Take 2.5 mg by mouth daily with breakfast.    [provider]  hydrochlorothiazide (HYDRODIURIL) 25 MG tablet Take 25 mg by mouth daily.    [provider]  lisinopril-hydrochlorothiazide (PRINZIDE,ZESTORETIC) 20-12.5 MG per tablet Take 1 tablet by mouth daily.    [provider]  MAGNESIUM PO Take by mouth.    [provider]  methocarbamol (ROBAXIN) 500 MG tablet Take 1 tablet (500 mg total) by mouth 3 (three) times daily as needed for muscle spasms.  10/30/16   Melina Schools, MD  Metoprolol Succinate 25 MG CS24 Take by mouth.    [provider]  omeprazole (PRILOSEC) 20 MG capsule Take 20 mg by mouth daily.    [provider]  ondansetron (ZOFRAN) 4 MG tablet Take 1 tablet (4 mg total) by mouth every 8 (eight) hours as needed for nausea or vomiting. 10/30/16   Melina Schools, MD  oxyCODONE-acetaminophen (PERCOCET) 10-325 MG tablet Take 1 tablet by mouth every 4 (four) hours as needed for pain. 10/30/16   Melina Schools, MD  pioglitazone (ACTOS) 15 MG tablet Take by mouth.    [provider]  promethazine (PHENERGAN) 12.5 MG tablet Take 12.5 mg by mouth every 6 (six) hours as needed for nausea or vomiting.    [provider]  rOPINIRole (REQUIP) 2 MG tablet Take 2 mg by mouth at  bedtime.    [provider]  rosuvastatin (CRESTOR) 5 MG tablet Take 5 mg by mouth every other day.    [provider]  spironolactone (ALDACTONE) 25 MG tablet Take 25 mg by mouth daily. 04/02/19   [provider]  traMADol (ULTRAM) 50 MG tablet Take 50 mg by mouth every 6 (six) hours as needed. for pain 12/22/18   [provider]    Allergies    Sulfa antibiotics and Adhesive [tape]  Review of Systems   Review of Systems  Constitutional: Positive for fever.  Neurological: Positive for weakness.  All other systems reviewed and are negative.   Physical Exam Updated Vital Signs BP (!) 155/142 (BP Location: Right Arm)   Pulse (!) 122   Temp 99 F (37.2 C) (Oral)   Resp 16   Ht 5\' 2"  (1.575 m)   Wt 70.3 kg   SpO2 96%   BMI 28.35 kg/m   Physical Exam Vitals and nursing note reviewed.  Constitutional:      Appearance: She is ill-appearing.  HENT:     Head: Normocephalic and atraumatic.     Right Ear: External ear normal.     Left Ear: External ear normal.     Nose: Nose normal.     Mouth/Throat:     Mouth: Mucous membranes are dry.     Pharynx: Oropharynx is clear.  Eyes:     Extraocular Movements: Extraocular movements intact.     Conjunctiva/sclera: Conjunctivae normal.     Pupils: Pupils are equal, round, and reactive to light.  Cardiovascular:     Rate and Rhythm: Regular rhythm. Tachycardia present.     Pulses: Normal pulses.     Heart sounds: Normal heart sounds.  Pulmonary:     Effort: Pulmonary effort is normal.     Breath sounds: Normal breath sounds.  Abdominal:     General: Abdomen is flat. Bowel sounds are normal.     Palpations: Abdomen is soft.  Musculoskeletal:        General: Normal range of motion.     Cervical back: Normal range of motion and neck supple.  Skin:    General: Skin is warm.     Capillary Refill: Capillary refill takes less than 2 seconds.  Neurological:     General: No focal deficit present.      Mental Status: She is alert. She is confused.     Comments: Pt is diffusely weak.  She was unable to stand up to walk from wheelchair to the bed.  We had to carry her.  She is also confused.  She is moving  all 4 extremities.  Psychiatric:        Mood and Affect: Mood normal.        Behavior: Behavior normal.        Thought Content: Thought content normal.        Judgment: Judgment normal.     ED Results / Procedures / Treatments   Labs (all labs ordered are listed, but only abnormal results are displayed) Labs Reviewed  CBC - Abnormal; Notable for the following components:      Result Value   WBC 18.4 (*)    RBC 5.24 (*)    Hemoglobin 15.7 (*)    HCT 47.5 (*)    All other components within normal limits  COMPREHENSIVE METABOLIC PANEL - Abnormal; Notable for the following components:   Sodium 134 (*)    Chloride 92 (*)    Glucose, Bld 209 (*)    BUN 34 (*)    Creatinine, Ser 2.03 (*)    Calcium 8.5 (*)    Total Protein 5.7 (*)    Albumin 3.3 (*)    Total Bilirubin 1.5 (*)    GFR calc non Af Amer 22 (*)    GFR calc Af Amer 26 (*)    Anion gap 17 (*)    All other components within normal limits  LACTIC ACID, PLASMA - Abnormal; Notable for the following components:   Lactic Acid, Venous 3.6 (*)    All other components within normal limits  URINALYSIS, ROUTINE W REFLEX MICROSCOPIC - Abnormal; Notable for the following components:   Color, Urine AMBER (*)    APPearance HAZY (*)    Bilirubin Urine MODERATE (*)    Ketones, ur 5 (*)    Protein, ur 30 (*)    Bacteria, UA MANY (*)    All other components within normal limits  APTT - Abnormal; Notable for the following components:   aPTT <20 (*)    All other components within normal limits  SARS CORONAVIRUS 2 BY RT PCR (HOSPITAL ORDER, [facility])  CULTURE, BLOOD (ROUTINE X 2)  CULTURE, BLOOD (ROUTINE X 2)  URINE CULTURE  GASTROINTESTINAL PANEL BY PCR, STOOL (REPLACES STOOL CULTURE)  C DIFFICILE  QUICK SCREEN W PCR REFLEX  LACTIC ACID, PLASMA  PROTIME-INR  LIPASE, BLOOD    EKG EKG Interpretation  Date/Time:  Thursday August 31 2020 18:49:12 EDT Ventricular Rate:  110 PR Interval:  132 QRS Duration: 72 QT Interval:  313 QTC Calculation: 424 R Axis:   32 Text Interpretation: Sinus tachycardia Multiform ventricular premature complexes Abnormal R-wave progression, early transition Borderline repolarization abnormality No significant change since last tracing Confirmed by Isla Pence [phone]) on 08/31/2020 9:44:01 PM   Radiology CT ABDOMEN PELVIS WO CONTRAST  Result Date: 08/31/2020 CLINICAL DATA:  Acute abdominal pain, nonlocalized. White blood cell count 18 EXAM: CT ABDOMEN AND PELVIS WITHOUT CONTRAST TECHNIQUE: Multidetector CT imaging of the abdomen and pelvis was performed following the standard protocol without IV contrast. COMPARISON:  CT abdomen pelvis 07/25/2014 FINDINGS: Lower chest: Pulmonary micronodule within the lingula. Bibasilar subsegmental atelectasis. Coronary artery calcifications. No significant pericardial effusion. Hepatobiliary: Similar appearing 4.4 cm fluid density lesion within the left hepatic lobe likely represents a simple hepatic cyst. No new focal liver abnormality is seen. Status post cholecystectomy. No biliary dilatation. Pancreas: Unremarkable. No pancreatic ductal dilatation or surrounding inflammatory changes. Spleen: Normal in size without focal abnormality. Adrenals/Urinary Tract: Similar-appearing 9 mm hypodense nodule within left adrenal gland consistent with adrenal  adenoma. Otherwise adrenals are unremarkable. Bilateral renal cortical scarring. No renal calculi, focal lesion, or hydronephrosis. No hydroureter or ureterolithiasis. The urinary bladder is unremarkable. Stomach/Bowel: Stomach is within normal limits. The appendix is not definitely identified. No evidence of bowel wall thickening, distention, or inflammatory changes. The colon is  under distended. There pericolonic fat stranding surrounding the sigmoid colon. No diverticula identified. The remainder of the colon is unremarkable. No pneumatosis. Vascular/Lymphatic: No abdominal aorta or iliac aneurysm. Moderate atherosclerotic plaque of the aorta and its branches. No abdominal, pelvic, or inguinal lymphadenopathy. Reproductive: Status post hysterectomy. No adnexal masses. Other: No intraperitoneal free fluid. No intraperitoneal free gas. No organized fluid collection. Musculoskeletal: No abdominal wall hernia or abnormality. Nonspecific coarse calcifications of the visualized breasts. No suspicious lytic or blastic osseous lesions. No acute displaced fracture. Multilevel degenerative changes of the spine. IMPRESSION: 1. Mild inflammatory changes of the sigmoid colon with no diverticula to suggest diverticulitis. Findings likely represent colitis. Etiology may be infectious, inflammatory, ischemic. 2.  Aortic Atherosclerosis (ICD10-I70.0). Electronically Signed   By: Iven Finn M.D.   On: 08/31/2020 22:10   DG Chest Portable 1 View  Result Date: 08/31/2020 CLINICAL DATA:  Possible COVID EXAM: PORTABLE CHEST 1 VIEW COMPARISON:  CT 02/19/2019, radiograph 03/23/2016 FINDINGS: Coarsened interstitial changes and bronchitic features are similar to comparison radiography. No consolidation, features of edema, pneumothorax, or effusion. The aorta is calcified. The remaining cardiomediastinal contours are unremarkable. No acute osseous or soft tissue abnormality. Degenerative changes are present in the imaged spine and shoulders. Chronic elevation of the distal left clavicle relative to the acromion may reflect remote [facility] joint injury. IMPRESSION: 1. No acute cardiopulmonary findings. 2. Chronic interstitial and bronchitic features. Electronically Signed   By: Lovena Le M.D.   On: 08/31/2020 15:53    Procedures Procedures (including critical care time)  Medications Ordered in  ED Medications  lactated ringers infusion ( Intravenous New Bag/Given 08/31/20 1939)  cefTRIAXone (ROCEPHIN) 1 g in sodium chloride 0.9 % 100 mL IVPB (0 g Intravenous Stopped 08/31/20 2111)  lactated ringers bolus 1,000 mL (0 mLs Intravenous Stopped 08/31/20 2111)    And  lactated ringers bolus 1,000 mL (0 mLs Intravenous Stopped 08/31/20 2228)    And  lactated ringers bolus 250 mL (0 mLs Intravenous Stopped 08/31/20 2111)    ED Course  I have reviewed the triage vital signs and the nursing notes.  Pertinent labs & imaging results that were available during my care of the patient were reviewed by me and considered in my medical decision making (see chart for details).    MDM Rules/Calculators/A&P                          Pt with evidence of possible sepsis from labs in the [facility] with elevated lactic and  HR and elevated wbc.  Pt given IVFs as she has an elevated lactic and an elevated Cr.  Lactic is improving, so is her mental status.  Pt does have evidence of a possible UTI, so she was given rocephin.  Pt did have some abdominal tenderness, so had a CT scan which shows evidence of colitis.  Stool studies have been ordered, but have not been sent due to no diarrhea here.    Pt d/w Dr.Chotiner ([facility]) who requested that we hold off on any further abx until we get a stool sample.  He will see pt for admission.   CRITICAL CARE Performed by: Almyra Free  Idabelle Mcpeters   Total critical care time: 30 minutes  Critical care time was exclusive of separately billable procedures and treating other patients.  Critical care was necessary to treat or prevent imminent or life-threatening deterioration.  Critical care was time spent personally by me on the following activities: development of treatment plan with patient and/or surrogate as well as nursing, discussions with consultants, evaluation of patient's response to treatment, examination of patient, obtaining history from patient or surrogate, ordering and  performing treatments and interventions, ordering and review of laboratory studies, ordering and review of radiographic studies, pulse oximetry and re-evaluation of patient's condition.  Final Clinical Impression(s) / ED Diagnoses Final diagnoses:  AKI (acute kidney injury) ([place])  Colitis  Acute cystitis without hematuria    Rx / DC Orders ED Discharge Orders    None       Isla Pence, MD 08/31/20 2241

## 2020-08-31 NOTE — H&P (Signed)
History and Physical    Anna Stone ERX:[id number] DOB: 1941-04-10 DOA: 08/31/2020  PCP: Marco Collie, MD   Patient coming from:  Home   Chief Complaint:   Not feeling well, diarrhea.  HPI: Anna Stone is a 79 y.o. female with medical history significant for Diabetes mellitus type 2, CKD 3, HTN, IBS, depression who presents for evaluation of generalized weakness and diarrhea. She reports that she has not felt good for the past 2-3 weeks with generalized weakness. She began to have diarrhea this am and reports multiple bouts of watery non-bloody diarrhea.  She reports she has not had any shortness of breath or cough.  She denies any urinary frequency or dysuria.  She has had diffuse lower abdominal cramping and mild pain in the last few days.  She called her PCP who recommended she come to the hospital for evaluation.  She reports a decreased appetite over the last few days.  She denies any vomiting.  She has not had any rash, chest pain, palpitations, fever or chills.  She denies any falls at home. Lives with her husband and reports he has not been sick.  She denies tobacco, alcohol, illicit drug use.  ED Course:    In the emergency room she is found to have elevated white blood cell count and initial lactic acid is elevated at 3.6.  Lactic acid did decrease to 1.5 after IV fluid hydration.  She has had tachycardia with tachypnea and is also found to have AKI on lab work.  CT scan of her abdomen shows colitis with no diverticulitis present.  With all these findings she meets criteria for sepsis and hospital service asked to admit for further management.  Blood and urine cultures were obtained in the emergency room and will be monitored.  Was given dose of Rocephin in the emergency room.  Review of Systems:  General: Generalized weakness. No fever, chills, weight loss, night sweats.  Denies dizziness.  Reports decreased appetite HENT: Denies head trauma, headache,  denies change in hearing, tinnitus.  Denies nasal congestion or bleeding.  Denies sore throat, sores in mouth.  Denies difficulty swallowing Eyes: Denies blurry vision, pain in eye, drainage.  Denies discoloration of eyes. Neck: Denies pain.  Denies swelling.  Denies pain with movement. Cardiovascular: Denies chest pain, palpitations.  Denies edema.  Denies orthopnea Respiratory: Denies shortness of breath, cough.  Denies wheezing.  Denies sputum production Gastrointestinal: Reports mild abdominal pain. Reports diarrhea past 24 hours. No swelling.  Denies nausea, vomiting.  Denies melena.  Denies hematemesis. Musculoskeletal: Denies limitation of movement.  Denies deformity or swelling.  Denies pain.  Denies arthralgias or myalgias. Genitourinary: Denies pelvic pain.  Denies urinary frequency or hesitancy.  Denies dysuria.  Skin: Denies rash.  Denies petechiae, purpura, ecchymosis. Neurological: Denies headache.  Denies syncope.  Denies seizure activity.  Denies weakness or paresthesia.  Denies slurred speech, drooping face.  Denies visual change. Psychiatric: Denies depression, anxiety.  Denies suicidal thoughts or ideation.  Denies hallucinations.  Past Medical History:  Diagnosis Date  . Cancer ([place])   . Chronic kidney disease    stage 3  . Depression   . Diabetes mellitus without complication ([place])   . Difficulty breathing   . GERD (gastroesophageal reflux disease)   . Hypertension   . IBS (irritable bowel syndrome)   . Muscle pain   . Palpitation   . PONV (postoperative nausea and vomiting)   . Swelling     Past  Surgical History:  Procedure Laterality Date  . ABDOMINAL HYSTERECTOMY    . ANKLE FRACTURE SURGERY Right   . BREAST SURGERY     right  . CHOLECYSTECTOMY    . DECOMPRESSIVE LUMBAR LAMINECTOMY LEVEL 1 Right 10/30/2016   Procedure: RIGHT L5-S1 DECOMPRESSION, EXCISION OF CYST LEVEL 1;  Surgeon: Melina Schools, MD;  Location: [facility];  Service: Orthopedics;  Laterality:  Right;  . OVARY SURGERY      Social History  reports that she has never smoked. She has never used smokeless tobacco. She reports that she does not drink alcohol and does not use drugs.  Allergies  Allergen Reactions  . Sulfa Antibiotics Itching and Rash  . Adhesive [Tape] Dermatitis    Blisters the skin    History reviewed. No pertinent family history.   Prior to Admission medications   Medication Sig Start Date End Date Taking? Authorizing Provider  amLODipine (NORVASC) 10 MG tablet Take 10 mg by mouth daily.    [provider]  amLODipine (NORVASC) 5 MG tablet Take 5 mg by mouth daily. 04/02/19   [provider]  aspirin 325 MG tablet Take 325 mg by mouth daily.    [provider]  Carboxymethylcellulose Sodium (REFRESH PLUS OP) Place 1 drop into both eyes 2 (two) times daily as needed.    [provider]  EPINEPHrine 0.3 mg/0.3 mL IJ SOAJ injection Inject into the muscle once.    [provider]  escitalopram (LEXAPRO) 10 MG tablet  11/02/19   [provider]  fluticasone (FLOVENT DISKUS) 50 MCG/BLIST diskus inhaler Inhale 1 puff into the lungs 2 (two) times daily.    [provider]  furosemide (LASIX) 20 MG tablet Take 20 mg by mouth daily.     [provider]  gabapentin (NEURONTIN) 600 MG tablet Take by mouth.    [provider]  glyBURIDE (DIABETA) 2.5 MG tablet Take 2.5 mg by mouth daily with breakfast.    [provider]  hydrochlorothiazide (HYDRODIURIL) 25 MG tablet Take 25 mg by mouth daily.    [provider]  lisinopril-hydrochlorothiazide (PRINZIDE,ZESTORETIC) 20-12.5 MG per tablet Take 1 tablet by mouth daily.    [provider]  MAGNESIUM PO Take by mouth.    [provider]  methocarbamol (ROBAXIN) 500 MG tablet Take 1 tablet (500 mg total) by mouth 3 (three) times daily as needed for muscle spasms. 10/30/16   Melina Schools, MD  Metoprolol Succinate  25 MG CS24 Take by mouth.    [provider]  omeprazole (PRILOSEC) 20 MG capsule Take 20 mg by mouth daily.    [provider]  ondansetron (ZOFRAN) 4 MG tablet Take 1 tablet (4 mg total) by mouth every 8 (eight) hours as needed for nausea or vomiting. 10/30/16   Melina Schools, MD  oxyCODONE-acetaminophen (PERCOCET) 10-325 MG tablet Take 1 tablet by mouth every 4 (four) hours as needed for pain. 10/30/16   Melina Schools, MD  pioglitazone (ACTOS) 15 MG tablet Take by mouth.    [provider]  promethazine (PHENERGAN) 12.5 MG tablet Take 12.5 mg by mouth every 6 (six) hours as needed for nausea or vomiting.    [provider]  rOPINIRole (REQUIP) 2 MG tablet Take 2 mg by mouth at bedtime.    [provider]  rosuvastatin (CRESTOR) 5 MG tablet Take 5 mg by mouth every other day.    [provider]  spironolactone (ALDACTONE) 25 MG tablet Take 25 mg by  mouth daily. 04/02/19   [provider]  traMADol (ULTRAM) 50 MG tablet Take 50 mg by mouth every 6 (six) hours as needed. for pain 12/22/18   [provider]    Physical Exam: Vitals:   08/31/20 2215 08/31/20 2230 08/31/20 2245 08/31/20 2315  BP:      Pulse: 100 93 91 100  Resp: 13 13 (!) 26 13  Temp:      TempSrc:      SpO2: 96% 95% 96% 91%  Weight:      Height:        Constitutional: NAD, calm, comfortable Vitals:   08/31/20 2215 08/31/20 2230 08/31/20 2245 08/31/20 2315  BP:      Pulse: 100 93 91 100  Resp: 13 13 (!) 26 13  Temp:      TempSrc:      SpO2: 96% 95% 96% 91%  Weight:      Height:       General: WDWN, Alert and oriented x3.  Eyes: EOMI, PERRL, lids and conjunctivae normal.  Sclera nonicteric HENT:  [place]/AT, external ears normal.  Nares patent without epistasis.  Mucous membranes are dry. Posterior pharynx clear of any exudate or lesions.  Neck: Soft, normal range of motion, supple, no masses, no thyromegaly.  Trachea midline Respiratory: clear to  auscultation bilaterally, no wheezing, no crackles. Normal respiratory effort. No accessory muscle use.  Cardiovascular: Regular rate and rhythm, no murmurs / rubs / gallops. No extremity edema. 1+ pedal pulses. Abdomen: Soft, Mild diffuse lower abdominal tenderness, nondistended, no rebound or guarding.  No masses palpated. No hepatosplenomegaly. Bowel sounds hyperactive. Musculoskeletal: FROM. no clubbing / cyanosis. No joint deformity upper and lower extremities. Normal muscle tone.  Skin: Warm, dry, intact no rashes, lesions, ulcers. No induration Neurologic: CN 2-12 grossly intact.  Normal speech.  Sensation intact, patella DTR +1 bilaterally. Strength 4/5 in all extremities.   Psychiatric: Normal judgment and insight.  Normal mood.    Labs on Admission: I have personally reviewed following labs and imaging studies  CBC: Recent Labs  Lab 08/31/20 1542  WBC 18.4*  HGB 15.7*  HCT 47.5*  MCV 90.6  PLT 329    Basic Metabolic Panel: Recent Labs  Lab 08/31/20 1542  NA 134*  K 4.0  CL 92*  CO2 25  GLUCOSE 209*  BUN 34*  CREATININE 2.03*  CALCIUM 8.5*    GFR: Estimated Creatinine Clearance: 20 mL/min (A) (by C-G formula based on SCr of 2.03 mg/dL (H)).  Liver Function Tests: Recent Labs  Lab 08/31/20 1542  AST 33  ALT 35  ALKPHOS 47  BILITOT 1.5*  PROT 5.7*  ALBUMIN 3.3*    Urine analysis:    Component Value Date/Time   COLORURINE AMBER (A) 08/31/2020 1949   APPEARANCEUR HAZY (A) 08/31/2020 1949   LABSPEC 1.025 08/31/2020 1949   PHURINE 5.0 08/31/2020 1949   GLUCOSEU NEGATIVE 08/31/2020 1949   HGBUR NEGATIVE 08/31/2020 1949   BILIRUBINUR MODERATE (A) 08/31/2020 1949   KETONESUR 5 (A) 08/31/2020 1949   PROTEINUR 30 (A) 08/31/2020 1949   NITRITE NEGATIVE 08/31/2020 1949   LEUKOCYTESUR NEGATIVE 08/31/2020 1949    Radiological Exams on Admission: CT ABDOMEN PELVIS WO CONTRAST  Result Date: 08/31/2020 CLINICAL DATA:  Acute abdominal pain, nonlocalized.  White blood cell count 18 EXAM: CT ABDOMEN AND PELVIS WITHOUT CONTRAST TECHNIQUE: Multidetector CT imaging of the abdomen and pelvis was performed following the standard protocol without IV contrast. COMPARISON:  CT abdomen pelvis 07/25/2014  FINDINGS: Lower chest: Pulmonary micronodule within the lingula. Bibasilar subsegmental atelectasis. Coronary artery calcifications. No significant pericardial effusion. Hepatobiliary: Similar appearing 4.4 cm fluid density lesion within the left hepatic lobe likely represents a simple hepatic cyst. No new focal liver abnormality is seen. Status post cholecystectomy. No biliary dilatation. Pancreas: Unremarkable. No pancreatic ductal dilatation or surrounding inflammatory changes. Spleen: Normal in size without focal abnormality. Adrenals/Urinary Tract: Similar-appearing 9 mm hypodense nodule within left adrenal gland consistent with adrenal adenoma. Otherwise adrenals are unremarkable. Bilateral renal cortical scarring. No renal calculi, focal lesion, or hydronephrosis. No hydroureter or ureterolithiasis. The urinary bladder is unremarkable. Stomach/Bowel: Stomach is within normal limits. The appendix is not definitely identified. No evidence of bowel wall thickening, distention, or inflammatory changes. The colon is under distended. There pericolonic fat stranding surrounding the sigmoid colon. No diverticula identified. The remainder of the colon is unremarkable. No pneumatosis. Vascular/Lymphatic: No abdominal aorta or iliac aneurysm. Moderate atherosclerotic plaque of the aorta and its branches. No abdominal, pelvic, or inguinal lymphadenopathy. Reproductive: Status post hysterectomy. No adnexal masses. Other: No intraperitoneal free fluid. No intraperitoneal free gas. No organized fluid collection. Musculoskeletal: No abdominal wall hernia or abnormality. Nonspecific coarse calcifications of the visualized breasts. No suspicious lytic or blastic osseous lesions. No acute  displaced fracture. Multilevel degenerative changes of the spine. IMPRESSION: 1. Mild inflammatory changes of the sigmoid colon with no diverticula to suggest diverticulitis. Findings likely represent colitis. Etiology may be infectious, inflammatory, ischemic. 2.  Aortic Atherosclerosis (ICD10-I70.0). Electronically Signed   By: Iven Finn M.D.   On: 08/31/2020 22:10   DG Chest Portable 1 View  Result Date: 08/31/2020 CLINICAL DATA:  Possible COVID EXAM: PORTABLE CHEST 1 VIEW COMPARISON:  CT 02/19/2019, radiograph 03/23/2016 FINDINGS: Coarsened interstitial changes and bronchitic features are similar to comparison radiography. No consolidation, features of edema, pneumothorax, or effusion. The aorta is calcified. The remaining cardiomediastinal contours are unremarkable. No acute osseous or soft tissue abnormality. Degenerative changes are present in the imaged spine and shoulders. Chronic elevation of the distal left clavicle relative to the acromion may reflect remote [facility] joint injury. IMPRESSION: 1. No acute cardiopulmonary findings. 2. Chronic interstitial and bronchitic features. Electronically Signed   By: Lovena Le M.D.   On: 08/31/2020 15:53    EKG: Independently reviewed.  EKG shows sinus tachycardia with occasional PVCs.  No acute ST elevation or depression.  QTc is 424  Assessment/Plan Principal Problem:   Colitis Ms. Sane is admitted to medical/surgical floor.  She has colitis on CT scan.  With history of diarrhea, colitis on CT scan and AKI is suspected she could have C. difficile.  C. difficile PCR test been ordered but stool samples not able to be collected yet.  Patient was given dose of Rocephin in the emergency room.  We will continue with Rocephin and Flagyl empirically for intra-abdominal infection with colitis.  If C. difficile is positive will initiate oral vancomycin. IV fluid hydration with LR at 150 mL's per hour overnight is continued. Recheck CBC, electrolytes  renal function morning Patient reports she thinks she may been treated with an antibiotic 6 weeks ago but is not sure. Blood and urine cultures were obtained in the emergency room and will be monitored for results.  Active Problems:   Sepsis ([place]) Ms. Bumgardner meets sepsis criteria based on elevated white blood cell count, tachycardia, elevated respiratory rate, colitis on CT scan.  Her initial lactic acid level was elevated at 3.6 whichj decreased to 1.5 after IV  fluid hydration in the emergency room.  Patient has not had hypotension. Incentive spirometer every 2 hours while awake.    AKI (acute kidney injury) ([place]) IV fluid hydration provided.  C. Difficile is known to cause AKI will be treated if C. difficile test is positive. Electrolytes and renal function.    Type 2 diabetes mellitus with stage 3 chronic kidney disease (HCC) Blood sugars were monitored with meals and bedtime.  Hemoglobin A1c will be checked.  Sliding scale insulin provided for glycemic control.    Essential hypertension Medications will be verified [place] by pharmacy and resumed.    Leukocytosis Blood cell count is elevated over 18,000.  Patient placed on empiric antibiotics and will recheck CBC in morning    DVT prophylaxis: [place] score elevated.  Lovenox for DVT prophylaxis Code Status:   Full code Family Communication:  Diagnosis plan discussed with patient.  Agrees with plan.  Further agrees to follow as clinically indicated.  No family is at bedside Disposition Plan:   Patient is from:  Home  Anticipated DC to:  Home  Anticipated DC date:  Anticipate greater than 2 midnights in the hospital to treat acute medical condition  Anticipated DC barriers: No barriers to discharge identified at this time   Admission status:  Inpatient  Severity of Illness: The appropriate patient status for this patient is INPATIENT. Inpatient status is judged to be reasonable and necessary in order to provide the required  intensity of service to ensure the patient's safety. The patient's presenting symptoms, physical exam findings, and initial radiographic and laboratory data in the context of their chronic comorbidities is felt to place them at high risk for further clinical deterioration. Furthermore, it is not anticipated that the patient will be medically stable for discharge from the hospital within 2 midnights of admission. The following factors support the patient status of inpatient.    * I certify that at the point of admission it is my clinical judgment that the patient will require inpatient hospital care spanning beyond 2 midnights from the point of admission due to high intensity of service, high risk for further deterioration and high frequency of surveillance required.Yevonne Aline Mckinley Adelstein MD [facility]  How to contact the [facility] Attending or Consulting provider [place] or covering provider during after hours [place], for this patient?   1. Check the care team in [facility] and look for a) attending/consulting [facility] provider listed and b) the [facility] team listed 2. Log into [email or web address] and use [place]'s universal password to access. If you do not have the password, please contact the hospital operator. 3. Locate the [facility] provider you are looking for under [facility] and page to a number that you can be directly reached. 4. If you still have difficulty reaching the provider, please page the [facility] (Director on Call) for the Hospitalists listed on amion for assistance.  08/31/2020, 11:24 PM

## 2020-08-31 NOTE — ED Triage Notes (Signed)
Pt sent her from her mD for possible covid or lupus , pt has not been able to get around much over the last 2 weeks , covid negative 2 weeks ago

## 2020-09-01 DIAGNOSIS — N179 Acute kidney failure, unspecified: Secondary | ICD-10-CM

## 2020-09-01 DIAGNOSIS — I10 Essential (primary) hypertension: Secondary | ICD-10-CM

## 2020-09-01 DIAGNOSIS — A419 Sepsis, unspecified organism: Principal | ICD-10-CM

## 2020-09-01 DIAGNOSIS — R6520 Severe sepsis without septic shock: Secondary | ICD-10-CM

## 2020-09-01 LAB — GASTROINTESTINAL PANEL BY PCR, STOOL (REPLACES STOOL CULTURE)

## 2020-09-01 LAB — CBC
HCT: 36.5 % (ref 36.0–46.0)
Hemoglobin: 12.2 g/dL (ref 12.0–15.0)
MCH: 30 pg (ref 26.0–34.0)
MCHC: 33.4 g/dL (ref 30.0–36.0)
MCV: 89.9 fL (ref 80.0–100.0)
Platelets: 124 10*3/uL — ABNORMAL LOW (ref 150–400)
RBC: 4.06 MIL/uL (ref 3.87–5.11)
RDW: 13.2 % (ref 11.5–15.5)
WBC: 18.9 10*3/uL — ABNORMAL HIGH (ref 4.0–10.5)
nRBC: 0 % (ref 0.0–0.2)

## 2020-09-01 LAB — BASIC METABOLIC PANEL
Anion gap: 11 (ref 5–15)
BUN: 24 mg/dL — ABNORMAL HIGH (ref 8–23)
CO2: 26 mmol/L (ref 22–32)
Calcium: 7.8 mg/dL — ABNORMAL LOW (ref 8.9–10.3)
Chloride: 96 mmol/L — ABNORMAL LOW (ref 98–111)
Creatinine, Ser: 1.23 mg/dL — ABNORMAL HIGH (ref 0.44–1.00)
GFR calc Af Amer: 48 mL/min — ABNORMAL LOW (ref >60)
GFR calc non Af Amer: 41 mL/min — ABNORMAL LOW (ref >60)
Glucose, Bld: 135 mg/dL — ABNORMAL HIGH (ref 70–99)
Potassium: 3.7 mmol/L (ref 3.5–5.1)
Sodium: 133 mmol/L — ABNORMAL LOW (ref 135–145)

## 2020-09-01 LAB — C DIFFICILE QUICK SCREEN W PCR REFLEX
C Diff antigen: NEGATIVE
C Diff interpretation: NOT DETECTED
C Diff toxin: NEGATIVE

## 2020-09-01 LAB — HEMOGLOBIN A1C
Hgb A1c MFr Bld: 7.2 % — ABNORMAL HIGH (ref 4.8–5.6)
Mean Plasma Glucose: 159.94 mg/dL

## 2020-09-01 LAB — CBG MONITORING, ED
Glucose-Capillary: 106 mg/dL — ABNORMAL HIGH (ref 70–99)
Glucose-Capillary: 172 mg/dL — ABNORMAL HIGH (ref 70–99)
Glucose-Capillary: 82 mg/dL (ref 70–99)
Glucose-Capillary: 83 mg/dL (ref 70–99)

## 2020-09-01 LAB — CORTISOL-AM, BLOOD: Cortisol - AM: 25.5 ug/dL — ABNORMAL HIGH (ref 6.7–22.6)

## 2020-09-01 LAB — PROCALCITONIN: Procalcitonin: 23.49 ng/mL

## 2020-09-01 LAB — GLUCOSE, CAPILLARY: Glucose-Capillary: 77 mg/dL (ref 70–99)

## 2020-09-01 MED ORDER — LACTATED RINGERS IV SOLN: INTRAVENOUS | Status: DC

## 2020-09-01 MED ORDER — LISINOPRIL 20 MG PO TABS
20.0000 mg | ORAL_TABLET | Freq: Every day | ORAL | Status: DC
  Administered 2020-09-01 – 2020-09-03 (×3): 20 mg via ORAL
  Filled 2020-09-01 (×3): qty 1

## 2020-09-01 MED ORDER — HYDROCHLOROTHIAZIDE 12.5 MG PO CAPS: 12.5000 mg | ORAL_CAPSULE | Freq: Every day | ORAL | Status: DC

## 2020-09-01 NOTE — Progress Notes (Signed)
[facility]  PROGRESS NOTE  Anna Stone GYJ:[id number] DOB: [birth year] DOA: 08/31/2020 PCP: Marco Collie, MD   Brief HPI:   79 year old female with history of diabetes mellitus type 2, CKD stage III, hypertension, IBS, depression presented with generalized weakness and diarrhea.  In the ED CT scan of the abdomen showed sigmoid colitis with no diverticulitis.  Stool for C. difficile PCR was ordered.  Patient given Rocephin and Flagyl in the ED.    Subjective   Patient seen and examined, patient incontinent of urine and stool.  No sample could be obtained as per nursing staff.  Denies abdominal pain.   Assessment/Plan:     1. Sepsis-patient presented with leukocytosis, tachycardia, tachypnea with colitis on CT scan.  Initial lactic  acid was elevated at 3.6.  Sepsis physiology is improving.  Lactic acid is down to 1.5 with IV fluids. 2. Colitis-stool for C. difficile PCR is ordered and is currently pending.  Patient started on ceftriaxone and IV Flagyl.  Continue hydration with LR at 100 mill per hour. 3. Diabetes mellitus type II-CBG well controlled, continue sliding scale insulin with NovoLog. 4. Hypertension-continue amlodipine, lisinopril.  Will hold HCTZ due to acute kidney injury 5. Acute kidney injury-resolved, patient baseline creatinine around 1.3 as of 2017.  She presented with creatinine of 2.03, improved to 1.23 with IV fluids.  Follow BMP in am.     COVID-19 Labs  No results for input(s): DDIMER, FERRITIN, LDH, CRP in the last 72 hours.  Lab Results  Component Value Date   [place] NEGATIVE 08/31/2020     Scheduled medications:   . amLODipine  10 mg Oral Daily  . aspirin  325 mg Oral Daily  . heparin  5,000 Units Subcutaneous Q8H  . lisinopril  20 mg Oral Daily   And  . hydrochlorothiazide  12.5 mg Oral Daily  . insulin aspart  0-15 Units Subcutaneous TID WC  . insulin aspart  0-5 Units Subcutaneous QHS  . metoprolol succinate  25 mg  Oral Daily  . rosuvastatin  5 mg Oral QODAY         CBG: Recent Labs  Lab 09/01/20 0004 09/01/20 0845  GLUCAP 172* 106*    SpO2: 94 %    CBC: Recent Labs  Lab 08/31/20 1542 09/01/20 0504  WBC 18.4* 18.9*  HGB 15.7* 12.2  HCT 47.5* 36.5  MCV 90.6 89.9  PLT 166 124*    Basic Metabolic Panel: Recent Labs  Lab 08/31/20 1542 09/01/20 0504  NA 134* 133*  K 4.0 3.7  CL 92* 96*  CO2 25 26  GLUCOSE 209* 135*  BUN 34* 24*  CREATININE 2.03* 1.23*  CALCIUM 8.5* 7.8*     Liver Function Tests: Recent Labs  Lab 08/31/20 1542  AST 33  ALT 35  ALKPHOS 47  BILITOT 1.5*  PROT 5.7*  ALBUMIN 3.3*     Antibiotics: Anti-infectives (From admission, onward)   Start     Dose/Rate Route Frequency Ordered Stop   08/31/20 2345  cefTRIAXone (ROCEPHIN) 2 g in sodium chloride 0.9 % 100 mL IVPB        2 g 200 mL/hr over 30 Minutes Intravenous Every 24 hours 08/31/20 2332     08/31/20 2345  metroNIDAZOLE (FLAGYL) IVPB 500 mg        500 mg 100 mL/hr over 60 Minutes Intravenous Every 8 hours 08/31/20 2332     08/31/20 2230  ciprofloxacin (CIPRO) IVPB 400 mg  Status:  Discontinued  400 mg 200 mL/hr over 60 Minutes Intravenous  Once 08/31/20 2217 08/31/20 2230   08/31/20 2230  metroNIDAZOLE (FLAGYL) IVPB 500 mg  Status:  Discontinued        500 mg 100 mL/hr over 60 Minutes Intravenous  Once 08/31/20 2217 08/31/20 2230   08/31/20 1930  cefTRIAXone (ROCEPHIN) 1 g in sodium chloride 0.9 % 100 mL IVPB        1 g 200 mL/hr over 30 Minutes Intravenous Every 24 hours 08/31/20 1918         DVT prophylaxis: Heparin  Code Status: Full code  Family Communication: No family at bedside    Status is: Inpatient  Dispo: The patient is from: Home              Anticipated d/c is to: Home              Anticipated d/c date is: 09/04/2020              Patient currently not medically stable for discharge  Barrier to discharge-ongoing treatment for diarrhea, AKI      Consultants:    Procedures:     Objective   Vitals:   09/01/20 0300 09/01/20 0435 09/01/20 0645 09/01/20 0847  BP: 136/90 105/69 129/61 124/68  Pulse: 97 97 96 (!) 104  Resp: 19 16 19 18   Temp:    97.7 F (36.5 C)  TempSrc:    Oral  SpO2: 91% 96% 99% 94%  Weight:      Height:        Intake/Output Summary (Last 24 hours) at 09/01/2020 0914 Last data filed at 08/31/2020 2228 Gross per 24 hour  Intake 2350 ml  Output --  Net 2350 ml    09/15 1901 - 09/17 0700 In: 2350  Out: -   Filed Weights   08/31/20 1941  Weight: 70.3 kg    Physical Examination:    General: Appears in no acute distress  Cardiovascular: S1-S2, regular, no murmur auscultated  Respiratory: Clear to auscultation bilaterally  Abdomen: Abdomen is soft, nontender, no organomegaly  Extremities: No edema in the lower extremities  Neurologic: Alert, oriented x3, intact insight judgment, no focal deficit noted    Data Reviewed:   Recent Results (from the past 240 hour(s))  SARS Coronavirus 2 by RT PCR (hospital order, performed in [place] hospital lab) Nasopharyngeal Nasopharyngeal Swab     Status: None   Collection Time: 08/31/20  3:37 PM   Specimen: Nasopharyngeal Swab  Result Value Ref Range Status   SARS Coronavirus 2 NEGATIVE NEGATIVE Final    Comment: (NOTE) SARS-CoV-2 target nucleic acids are NOT DETECTED.  The SARS-CoV-2 RNA is generally detectable in upper and lower respiratory specimens during the acute phase of infection. The lowest concentration of SARS-CoV-2 viral copies this assay can detect is 250 copies / mL. A negative result does not preclude SARS-CoV-2 infection and should not be used as the sole basis for treatment or other patient management decisions.  A negative result may occur with improper specimen collection / handling, submission of specimen other than nasopharyngeal swab, presence of viral mutation(s) within the areas targeted by this assay, and  inadequate number of viral copies (<250 copies / mL). A negative result must be combined with clinical observations, patient history, and epidemiological information.  Fact Sheet for Patients:   StrictlyIdeas.no  Fact Sheet for Healthcare Providers: BankingDealers.co.za  This test is not yet approved or  cleared by the [place] FDA and  has been authorized for detection and/or diagnosis of SARS-CoV-2 by FDA under an Emergency Use Authorization (EUA).  This EUA will remain in effect (meaning this test can be used) for the duration of the COVID-19 declaration under Section 564(b)(1) of the Act, 21 U.S.C. section 360bbb-3(b)(1), unless the authorization is terminated or revoked sooner.  Performed at [facility], [place] [address], [place], [place] [address]     Recent Labs  Lab 08/31/20 2030  LIPASE 33   No results for input(s): AMMONIA in the last 168 hours.  Cardiac Enzymes: No results for input(s): CKTOTAL, CKMB, CKMBINDEX, TROPONINI in the last 168 hours. BNP (last 3 results) No results for input(s): BNP in the last 8760 hours.  ProBNP (last 3 results) No results for input(s): PROBNP in the last 8760 hours.  Studies:  CT ABDOMEN PELVIS WO CONTRAST  Result Date: 08/31/2020 CLINICAL DATA:  Acute abdominal pain, nonlocalized. White blood cell count 18 EXAM: CT ABDOMEN AND PELVIS WITHOUT CONTRAST TECHNIQUE: Multidetector CT imaging of the abdomen and pelvis was performed following the standard protocol without IV contrast. COMPARISON:  CT abdomen pelvis 07/25/2014 FINDINGS: Lower chest: Pulmonary micronodule within the lingula. Bibasilar subsegmental atelectasis. Coronary artery calcifications. No significant pericardial effusion. Hepatobiliary: Similar appearing 4.4 cm fluid density lesion within the left hepatic lobe likely represents a simple hepatic cyst. No new focal liver abnormality is seen. Status post  cholecystectomy. No biliary dilatation. Pancreas: Unremarkable. No pancreatic ductal dilatation or surrounding inflammatory changes. Spleen: Normal in size without focal abnormality. Adrenals/Urinary Tract: Similar-appearing 9 mm hypodense nodule within left adrenal gland consistent with adrenal adenoma. Otherwise adrenals are unremarkable. Bilateral renal cortical scarring. No renal calculi, focal lesion, or hydronephrosis. No hydroureter or ureterolithiasis. The urinary bladder is unremarkable. Stomach/Bowel: Stomach is within normal limits. The appendix is not definitely identified. No evidence of bowel wall thickening, distention, or inflammatory changes. The colon is under distended. There pericolonic fat stranding surrounding the sigmoid colon. No diverticula identified. The remainder of the colon is unremarkable. No pneumatosis. Vascular/Lymphatic: No abdominal aorta or iliac aneurysm. Moderate atherosclerotic plaque of the aorta and its branches. No abdominal, pelvic, or inguinal lymphadenopathy. Reproductive: Status post hysterectomy. No adnexal masses. Other: No intraperitoneal free fluid. No intraperitoneal free gas. No organized fluid collection. Musculoskeletal: No abdominal wall hernia or abnormality. Nonspecific coarse calcifications of the visualized breasts. No suspicious lytic or blastic osseous lesions. No acute displaced fracture. Multilevel degenerative changes of the spine. IMPRESSION: 1. Mild inflammatory changes of the sigmoid colon with no diverticula to suggest diverticulitis. Findings likely represent colitis. Etiology may be infectious, inflammatory, ischemic. 2.  Aortic Atherosclerosis (ICD10-I70.0). Electronically Signed   By: Iven Finn M.D.   On: 08/31/2020 22:10   DG Chest Portable 1 View  Result Date: 08/31/2020 CLINICAL DATA:  Possible COVID EXAM: PORTABLE CHEST 1 VIEW COMPARISON:  CT 02/19/2019, radiograph 03/23/2016 FINDINGS: Coarsened interstitial changes and bronchitic  features are similar to comparison radiography. No consolidation, features of edema, pneumothorax, or effusion. The aorta is calcified. The remaining cardiomediastinal contours are unremarkable. No acute osseous or soft tissue abnormality. Degenerative changes are present in the imaged spine and shoulders. Chronic elevation of the distal left clavicle relative to the acromion may reflect remote [facility] joint injury. IMPRESSION: 1. No acute cardiopulmonary findings. 2. Chronic interstitial and bronchitic features. Electronically Signed   By: Lovena Le M.D.   On: 08/31/2020 15:53       [place]   [facility] If 7PM-7AM, please contact  night-coverage at [email or web address], Office  [phone]   09/01/2020, 9:14 AM  LOS: 1 day

## 2020-09-01 NOTE — ED Notes (Signed)
Patient incont of urine and stool , patient bed linens changed and patient repositioned in bed. Clean purewick applied.

## 2020-09-01 NOTE — ED Notes (Signed)
Patient had a purewick on of which became displaced linens changed and patient cleaned. purewick reapplied. Open wounds to both inner thighs

## 2020-09-01 NOTE — ED Notes (Signed)
incont of stool cleaned patient

## 2020-09-02 ENCOUNTER — Other Ambulatory Visit: Payer: Self-pay

## 2020-09-02 LAB — COMPREHENSIVE METABOLIC PANEL
ALT: 25 U/L (ref 0–44)
AST: 21 U/L (ref 15–41)
Albumin: 2.1 g/dL — ABNORMAL LOW (ref 3.5–5.0)
Alkaline Phosphatase: 26 U/L — ABNORMAL LOW (ref 38–126)
Anion gap: 11 (ref 5–15)
BUN: 13 mg/dL (ref 8–23)
CO2: 24 mmol/L (ref 22–32)
Calcium: 7.9 mg/dL — ABNORMAL LOW (ref 8.9–10.3)
Chloride: 103 mmol/L (ref 98–111)
Creatinine, Ser: 1.01 mg/dL — ABNORMAL HIGH (ref 0.44–1.00)
GFR calc Af Amer: >60 mL/min (ref >60)
GFR calc non Af Amer: 52 mL/min — ABNORMAL LOW (ref >60)
Glucose, Bld: 75 mg/dL (ref 70–99)
Potassium: 3.4 mmol/L — ABNORMAL LOW (ref 3.5–5.1)
Sodium: 138 mmol/L (ref 135–145)
Total Bilirubin: 0.7 mg/dL (ref 0.3–1.2)
Total Protein: 4.1 g/dL — ABNORMAL LOW (ref 6.5–8.1)

## 2020-09-02 LAB — GLUCOSE, CAPILLARY
Glucose-Capillary: 55 mg/dL — ABNORMAL LOW (ref 70–99)
Glucose-Capillary: 99 mg/dL (ref 70–99)
Glucose-Capillary: 137 mg/dL — ABNORMAL HIGH (ref 70–99)

## 2020-09-02 LAB — URINE CULTURE: Culture: 60000 — AB

## 2020-09-02 LAB — MAGNESIUM: Magnesium: 1.7 mg/dL (ref 1.7–2.4)

## 2020-09-02 LAB — CBC
HCT: 32.9 % — ABNORMAL LOW (ref 36.0–46.0)
Hemoglobin: 10.7 g/dL — ABNORMAL LOW (ref 12.0–15.0)
MCH: 29.8 pg (ref 26.0–34.0)
MCHC: 32.5 g/dL (ref 30.0–36.0)
MCV: 91.6 fL (ref 80.0–100.0)
Platelets: 106 10*3/uL — ABNORMAL LOW (ref 150–400)
RBC: 3.59 MIL/uL — ABNORMAL LOW (ref 3.87–5.11)
RDW: 13.6 % (ref 11.5–15.5)
WBC: 9.8 10*3/uL (ref 4.0–10.5)
nRBC: 0 % (ref 0.0–0.2)

## 2020-09-02 MED ORDER — MAGNESIUM SULFATE IN D5W 1-5 GM/100ML-% IV SOLN
1.0000 g | Freq: Once | INTRAVENOUS | Status: AC
  Administered 2020-09-02: 1 g via INTRAVENOUS
  Filled 2020-09-02: qty 100

## 2020-09-02 MED ORDER — POTASSIUM CHLORIDE CRYS ER 20 MEQ PO TBCR
30.0000 meq | EXTENDED_RELEASE_TABLET | Freq: Once | ORAL | Status: AC
  Administered 2020-09-02: 30 meq via ORAL
  Filled 2020-09-02: qty 1

## 2020-09-02 MED ORDER — POTASSIUM CHLORIDE CRYS ER 20 MEQ PO TBCR
40.0000 meq | EXTENDED_RELEASE_TABLET | Freq: Once | ORAL | Status: AC
  Administered 2020-09-02: 40 meq via ORAL
  Filled 2020-09-02: qty 2

## 2020-09-02 MED ORDER — INSULIN ASPART 100 UNIT/ML ~~LOC~~ SOLN: 0.0000 [IU] | Freq: Every day | SUBCUTANEOUS | Status: DC

## 2020-09-02 MED ORDER — INSULIN ASPART 100 UNIT/ML ~~LOC~~ SOLN: 0.0000 [IU] | Freq: Three times a day (TID) | SUBCUTANEOUS | Status: DC

## 2020-09-02 NOTE — Progress Notes (Signed)
Pt has some short runs of NSVT with HR=140's-160's. Pt was asymptomatic & asleep when it occurs. Denies any chest pain or discomfort. V/S stable. Dr Marlowe Sax informed & ordered stat Mg level. Will continue to monitor pt.

## 2020-09-02 NOTE — Progress Notes (Signed)
Pt CBG 55 Pt states she had 8oz of juice  Rechecked CBG 99 at 0754 Pt is alert and oriented x4 Will continue to monitor

## 2020-09-02 NOTE — Progress Notes (Signed)
[facility]  PROGRESS NOTE  Anna Stone OHY:[id number] DOB: [birth year] DOA: 08/31/2020 PCP: Marco Collie, MD   Brief HPI:   79 year old female with history of diabetes mellitus type 2, CKD stage III, hypertension, IBS, depression presented with generalized weakness and diarrhea.  In the ED CT scan of the abdomen showed sigmoid colitis with no diverticulitis.  Stool for C. difficile PCR was ordered.  Patient given Rocephin and Flagyl in the ED.    Subjective   Patient seen and examined, stool for C. difficile PCR is negative.  GI pathogen panel is negative.  Diarrhea has resolved.  Denies abdominal pain   Assessment/Plan:     1. Sepsis-patient presented with leukocytosis, tachycardia, tachypnea with colitis on CT scan.  Initial lactic  acid was elevated at 3.6.  Sepsis physiology has resolved.  Lactic acid is down to 1.5 with IV fluids.  Will change LR to KVO.  WBC is down to 9.8 2. Colitis-stool for C. difficile PCR is negative.  Patient improved with ceftriaxone and Flagyl.  Tolerating diet.  Will advance diet as tolerated.  Change IV fluids to [facility].  3. Diabetes mellitus type II-CBG well controlled, change sliding scale to sensitive. 4. Hypertension-continue amlodipine, lisinopril.  Will hold HCTZ due to acute kidney injury 5. Acute kidney injury-resolved, patient baseline creatinine around 1.3 as of 2017.  She presented with creatinine of 2.03, improved to 1.01 with IV fluids.   6. Hypokalemia/hypomagnesemia-K. Dur 40 mEq p.o. x1, magnesium sulfate 1 g IV x1.   We will obtain PT evaluation  COVID-19 Labs  No results for input(s): DDIMER, FERRITIN, LDH, CRP in the last 72 hours.  Lab Results  Component Value Date   [place] NEGATIVE 08/31/2020     Scheduled medications:   . amLODipine  10 mg Oral Daily  . aspirin  325 mg Oral Daily  . heparin  5,000 Units Subcutaneous Q8H  . insulin aspart  0-5 Units Subcutaneous QHS  . insulin aspart  0-6 Units  Subcutaneous TID WC  . lisinopril  20 mg Oral Daily  . metoprolol succinate  25 mg Oral Daily  . rosuvastatin  5 mg Oral QODAY         CBG: Recent Labs  Lab 09/01/20 1241 09/01/20 1604 09/01/20 2122 09/02/20 0608 09/02/20 1054  GLUCAP 82 83 77 55* 99    SpO2: 95 %    CBC: Recent Labs  Lab 08/31/20 1542 09/01/20 0504 09/02/20 0259  WBC 18.4* 18.9* 9.8  HGB 15.7* 12.2 10.7*  HCT 47.5* 36.5 32.9*  MCV 90.6 89.9 91.6  PLT 166 124* 106*    Basic Metabolic Panel: Recent Labs  Lab 08/31/20 1542 09/01/20 0504 09/02/20 0259  NA 134* 133* 138  K 4.0 3.7 3.4*  CL 92* 96* 103  CO2 25 26 24   GLUCOSE 209* 135* 75  BUN 34* 24* 13  CREATININE 2.03* 1.23* 1.01*  CALCIUM 8.5* 7.8* 7.9*  MG  --   --  1.7     Liver Function Tests: Recent Labs  Lab 08/31/20 1542 09/02/20 0259  AST 33 21  ALT 35 25  ALKPHOS 47 26*  BILITOT 1.5* 0.7  PROT 5.7* 4.1*  ALBUMIN 3.3* 2.1*     Antibiotics: Anti-infectives (From admission, onward)   Start     Dose/Rate Route Frequency Ordered Stop   08/31/20 2345  cefTRIAXone (ROCEPHIN) 2 g in sodium chloride 0.9 % 100 mL IVPB        2 g 200 mL/hr  over 30 Minutes Intravenous Every 24 hours 08/31/20 2332     08/31/20 2345  metroNIDAZOLE (FLAGYL) IVPB 500 mg        500 mg 100 mL/hr over 60 Minutes Intravenous Every 8 hours 08/31/20 2332     08/31/20 2230  ciprofloxacin (CIPRO) IVPB 400 mg  Status:  Discontinued        400 mg 200 mL/hr over 60 Minutes Intravenous  Once 08/31/20 2217 08/31/20 2230   08/31/20 2230  metroNIDAZOLE (FLAGYL) IVPB 500 mg  Status:  Discontinued        500 mg 100 mL/hr over 60 Minutes Intravenous  Once 08/31/20 2217 08/31/20 2230   08/31/20 1930  cefTRIAXone (ROCEPHIN) 1 g in sodium chloride 0.9 % 100 mL IVPB  Status:  Discontinued        1 g 200 mL/hr over 30 Minutes Intravenous Every 24 hours 08/31/20 1918 09/01/20 1752       DVT prophylaxis: Heparin  Code Status: Full code  Family  Communication: No family at bedside    Status is: Inpatient  Dispo: The patient is from: Home              Anticipated d/c is to: Home              Anticipated d/c date is: 09/03/2020              Patient currently not medically stable for discharge  Barrier to discharge-colitis treatment, PT evaluation pending     Consultants:    Procedures:     Objective   Vitals:   09/01/20 2110 09/02/20 0215 09/02/20 0607 09/02/20 0839  BP: (!) 128/52 (!) 127/57 137/63 (!) 125/52  Pulse: 83 81 87 89  Resp: 18 18 16    Temp: 98.2 F (36.8 C) 98 F (36.7 C) 97.9 F (36.6 C)   TempSrc: Oral Oral Oral   SpO2: 98% 98% 95%   Weight:      Height:        Intake/Output Summary (Last 24 hours) at 09/02/2020 1241 Last data filed at 09/02/2020 1213 Gross per 24 hour  Intake 3285.79 ml  Output 600 ml  Net 2685.79 ml    09/16 1901 - 09/18 0700 In: 4483.4 [I.V.:1742.2] Out: 600 [Urine:600]  Filed Weights   08/31/20 1941  Weight: 70.3 kg    Physical Examination:    General-appears in no acute distress  Heart-S1-S2, regular, no murmur auscultated  Lungs-clear to auscultation bilaterally, no wheezing or crackles auscultated  Abdomen-soft, nontender, no organomegaly  Extremities-no edema in the lower extremities  Neuro-alert, oriented x3, no focal deficit noted    Data Reviewed:   Recent Results (from the past 240 hour(s))  SARS Coronavirus 2 by RT PCR (hospital order, performed in [place] hospital lab) Nasopharyngeal Nasopharyngeal Swab     Status: None   Collection Time: 08/31/20  3:37 PM   Specimen: Nasopharyngeal Swab  Result Value Ref Range Status   SARS Coronavirus 2 NEGATIVE NEGATIVE Final    Comment: (NOTE) SARS-CoV-2 target nucleic acids are NOT DETECTED.  The SARS-CoV-2 RNA is generally detectable in upper and lower respiratory specimens during the acute phase of infection. The lowest concentration of SARS-CoV-2 viral copies this assay can detect is  250 copies / mL. A negative result does not preclude SARS-CoV-2 infection and should not be used as the sole basis for treatment or other patient management decisions.  A negative result may occur with improper specimen collection / handling, submission of  specimen other than nasopharyngeal swab, presence of viral mutation(s) within the areas targeted by this assay, and inadequate number of viral copies (<250 copies / mL). A negative result must be combined with clinical observations, patient history, and epidemiological information.  Fact Sheet for Patients:   StrictlyIdeas.no  Fact Sheet for Healthcare Providers: BankingDealers.co.za  This test is not yet approved or  cleared by the [place] FDA and has been authorized for detection and/or diagnosis of SARS-CoV-2 by FDA under an Emergency Use Authorization (EUA).  This EUA will remain in effect (meaning this test can be used) for the duration of the COVID-19 declaration under Section 564(b)(1) of the Act, 21 U.S.C. section 360bbb-3(b)(1), unless the authorization is terminated or revoked sooner.  Performed at [facility], [place] [address] [place], [place] [address]   Culture, blood (routine x 2)     Status: None (Preliminary result)   Collection Time: 08/31/20  6:52 PM   Specimen: BLOOD  Result Value Ref Range Status   Specimen Description BLOOD LEFT ANTECUBITAL  Final   Special Requests   Final    BOTTLES DRAWN AEROBIC AND ANAEROBIC Blood Culture results may not be optimal due to an inadequate volume of blood received in culture bottles   Culture   Final    NO GROWTH < 24 HOURS Performed at [facility], [place] [address], [place], [place] [address]    Report Status PENDING  Incomplete  Culture, blood (routine x 2)     Status: None (Preliminary result)   Collection Time: 08/31/20  7:26 PM   Specimen: BLOOD  Result Value Ref Range Status   Specimen Description  BLOOD RIGHT ANTECUBITAL  Final   Special Requests   Final    BOTTLES DRAWN AEROBIC AND ANAEROBIC Blood Culture adequate volume   Culture   Final    NO GROWTH < 24 HOURS Performed at [facility], [place] [address] [place], [place] [address]    Report Status PENDING  Incomplete  Urine culture     Status: Abnormal   Collection Time: 08/31/20  7:49 PM   Specimen: Urine, Random  Result Value Ref Range Status   Specimen Description URINE, RANDOM  Final   Special Requests   Final    NONE Performed at [facility], [place] [address] [place], [place] [address]    Culture 60,000 COLONIES/mL ESCHERICHIA COLI (A)  Final   Report Status 09/02/2020 FINAL  Final   Organism ID, Bacteria ESCHERICHIA COLI (A)  Final      Susceptibility   Escherichia coli - MIC*    AMPICILLIN 4 SENSITIVE Sensitive     CEFAZOLIN <=4 SENSITIVE Sensitive     CEFTRIAXONE <=0.25 SENSITIVE Sensitive     CIPROFLOXACIN <=0.25 SENSITIVE Sensitive     GENTAMICIN <=1 SENSITIVE Sensitive     IMIPENEM <=0.25 SENSITIVE Sensitive     NITROFURANTOIN <=16 SENSITIVE Sensitive     TRIMETH/SULFA <=20 SENSITIVE Sensitive     AMPICILLIN/SULBACTAM <=2 SENSITIVE Sensitive     PIP/TAZO <=4 SENSITIVE Sensitive     * 60,000 COLONIES/mL ESCHERICHIA COLI  Gastrointestinal Panel by PCR , Stool     Status: None   Collection Time: 09/01/20 10:45 AM   Specimen: Stool  Result Value Ref Range Status   Campylobacter species NOT DETECTED NOT DETECTED Final   Plesimonas shigelloides NOT DETECTED NOT DETECTED Final   Salmonella species NOT DETECTED NOT DETECTED Final   Yersinia enterocolitica NOT DETECTED NOT DETECTED  Final   Vibrio species NOT DETECTED NOT DETECTED Final   Vibrio cholerae NOT DETECTED NOT DETECTED Final   Enteroaggregative E coli (EAEC) NOT DETECTED NOT DETECTED Final   Enteropathogenic E coli (EPEC) NOT DETECTED NOT DETECTED Final   Enterotoxigenic E coli (ETEC) NOT DETECTED NOT DETECTED Final   Shiga like toxin  producing E coli (STEC) NOT DETECTED NOT DETECTED Final   Shigella/Enteroinvasive E coli (EIEC) NOT DETECTED NOT DETECTED Final   Cryptosporidium NOT DETECTED NOT DETECTED Final   Cyclospora cayetanensis NOT DETECTED NOT DETECTED Final   Entamoeba histolytica NOT DETECTED NOT DETECTED Final   Giardia lamblia NOT DETECTED NOT DETECTED Final   Adenovirus F40/41 NOT DETECTED NOT DETECTED Final   Astrovirus NOT DETECTED NOT DETECTED Final   Norovirus GI/GII NOT DETECTED NOT DETECTED Final   Rotavirus A NOT DETECTED NOT DETECTED Final   Sapovirus (I, II, IV, and V) NOT DETECTED NOT DETECTED Final    Comment: Performed at [facility], [place] [address] [place], [place] [address]  C Difficile Quick Screen w PCR reflex     Status: None   Collection Time: 09/01/20 10:45 AM   Specimen: Stool  Result Value Ref Range Status   C Diff antigen NEGATIVE NEGATIVE Final   C Diff toxin NEGATIVE NEGATIVE Final   C Diff interpretation No C. difficile detected.  Final    Comment: Performed at [facility], [place] [address] [place], [place] [address]    Recent Labs  Lab 08/31/20 2030  LIPASE 33   No results for input(s): AMMONIA in the last 168 hours.  Cardiac Enzymes: No results for input(s): CKTOTAL, CKMB, CKMBINDEX, TROPONINI in the last 168 hours. BNP (last 3 results) No results for input(s): BNP in the last 8760 hours.  ProBNP (last 3 results) No results for input(s): PROBNP in the last 8760 hours.  Studies:  CT ABDOMEN PELVIS WO CONTRAST  Result Date: 08/31/2020 CLINICAL DATA:  Acute abdominal pain, nonlocalized. White blood cell count 18 EXAM: CT ABDOMEN AND PELVIS WITHOUT CONTRAST TECHNIQUE: Multidetector CT imaging of the abdomen and pelvis was performed following the standard protocol without IV contrast. COMPARISON:  CT abdomen pelvis 07/25/2014 FINDINGS: Lower chest: Pulmonary micronodule within the lingula. Bibasilar subsegmental atelectasis. Coronary artery  calcifications. No significant pericardial effusion. Hepatobiliary: Similar appearing 4.4 cm fluid density lesion within the left hepatic lobe likely represents a simple hepatic cyst. No new focal liver abnormality is seen. Status post cholecystectomy. No biliary dilatation. Pancreas: Unremarkable. No pancreatic ductal dilatation or surrounding inflammatory changes. Spleen: Normal in size without focal abnormality. Adrenals/Urinary Tract: Similar-appearing 9 mm hypodense nodule within left adrenal gland consistent with adrenal adenoma. Otherwise adrenals are unremarkable. Bilateral renal cortical scarring. No renal calculi, focal lesion, or hydronephrosis. No hydroureter or ureterolithiasis. The urinary bladder is unremarkable. Stomach/Bowel: Stomach is within normal limits. The appendix is not definitely identified. No evidence of bowel wall thickening, distention, or inflammatory changes. The colon is under distended. There pericolonic fat stranding surrounding the sigmoid colon. No diverticula identified. The remainder of the colon is unremarkable. No pneumatosis. Vascular/Lymphatic: No abdominal aorta or iliac aneurysm. Moderate atherosclerotic plaque of the aorta and its branches. No abdominal, pelvic, or inguinal lymphadenopathy. Reproductive: Status post hysterectomy. No adnexal masses. Other: No intraperitoneal free fluid. No intraperitoneal free gas. No organized fluid collection. Musculoskeletal: No abdominal wall hernia or abnormality. Nonspecific coarse calcifications of the visualized breasts. No suspicious lytic or blastic osseous lesions. No acute displaced fracture. Multilevel degenerative changes  of the spine. IMPRESSION: 1. Mild inflammatory changes of the sigmoid colon with no diverticula to suggest diverticulitis. Findings likely represent colitis. Etiology may be infectious, inflammatory, ischemic. 2.  Aortic Atherosclerosis (ICD10-I70.0). Electronically Signed   By: Iven Finn M.D.   On:  08/31/2020 22:10   DG Chest Portable 1 View  Result Date: 08/31/2020 CLINICAL DATA:  Possible COVID EXAM: PORTABLE CHEST 1 VIEW COMPARISON:  CT 02/19/2019, radiograph 03/23/2016 FINDINGS: Coarsened interstitial changes and bronchitic features are similar to comparison radiography. No consolidation, features of edema, pneumothorax, or effusion. The aorta is calcified. The remaining cardiomediastinal contours are unremarkable. No acute osseous or soft tissue abnormality. Degenerative changes are present in the imaged spine and shoulders. Chronic elevation of the distal left clavicle relative to the acromion may reflect remote [facility] joint injury. IMPRESSION: 1. No acute cardiopulmonary findings. 2. Chronic interstitial and bronchitic features. Electronically Signed   By: Lovena Le M.D.   On: 08/31/2020 15:53       [place]   [facility] If 7PM-7AM, please contact night-coverage at [email or web address], Office  [phone]   09/02/2020, 12:41 PM  LOS: 2 days            [facility]  PROGRESS NOTE  Anna Stone JEH:[id number] DOB: 1941-02-01 DOA: 08/31/2020 PCP: Marco Collie, MD   Brief HPI:   79 year old female with history of diabetes mellitus type 2, CKD stage III, hypertension, IBS, depression presented with generalized weakness and diarrhea.  In the ED CT scan of the abdomen showed sigmoid colitis with no diverticulitis.  Stool for C. difficile PCR was ordered.  Patient given Rocephin and Flagyl in the ED.    Subjective   Patient seen and examined, patient incontinent of urine and stool.  No sample could be obtained as per nursing staff.  Denies abdominal pain.   Assessment/Plan:     7. Sepsis-patient presented with leukocytosis, tachycardia, tachypnea with colitis on CT scan.  Initial lactic  acid was elevated at 3.6.  Sepsis physiology is improving.  Lactic acid is down to 1.5 with IV fluids. 8. Colitis-stool for C. difficile PCR is ordered  and is currently pending.  Patient started on ceftriaxone and IV Flagyl.  Continue hydration with LR at 100 mill per hour. 9. Diabetes mellitus type II-CBG well controlled, continue sliding scale insulin with NovoLog. 10. Hypertension-continue amlodipine, lisinopril.  Will hold HCTZ due to acute kidney injury 11. Acute kidney injury-resolved, patient baseline creatinine around 1.3 as of 2017.  She presented with creatinine of 2.03, improved to 1.23 with IV fluids.  Follow BMP in am.     COVID-19 Labs  No results for input(s): DDIMER, FERRITIN, LDH, CRP in the last 72 hours.  Lab Results  Component Value Date   [place] NEGATIVE 08/31/2020     Scheduled medications:   . amLODipine  10 mg Oral Daily  . aspirin  325 mg Oral Daily  . heparin  5,000 Units Subcutaneous Q8H  . insulin aspart  0-5 Units Subcutaneous QHS  . insulin aspart  0-6 Units Subcutaneous TID WC  . lisinopril  20 mg Oral Daily  . metoprolol succinate  25 mg Oral Daily  . rosuvastatin  5 mg Oral QODAY         CBG: Recent Labs  Lab 09/01/20 1241 09/01/20 1604 09/01/20 2122 09/02/20 0608 09/02/20 1054  GLUCAP 82 83 77 55* 99    SpO2: 95 %    CBC: Recent Labs  Lab 08/31/20 1542 09/01/20  5956 09/02/20 0259  WBC 18.4* 18.9* 9.8  HGB 15.7* 12.2 10.7*  HCT 47.5* 36.5 32.9*  MCV 90.6 89.9 91.6  PLT 166 124* 106*    Basic Metabolic Panel: Recent Labs  Lab 08/31/20 1542 09/01/20 0504 09/02/20 0259  NA 134* 133* 138  K 4.0 3.7 3.4*  CL 92* 96* 103  CO2 25 26 24   GLUCOSE 209* 135* 75  BUN 34* 24* 13  CREATININE 2.03* 1.23* 1.01*  CALCIUM 8.5* 7.8* 7.9*  MG  --   --  1.7     Liver Function Tests: Recent Labs  Lab 08/31/20 1542 09/02/20 0259  AST 33 21  ALT 35 25  ALKPHOS 47 26*  BILITOT 1.5* 0.7  PROT 5.7* 4.1*  ALBUMIN 3.3* 2.1*     Antibiotics: Anti-infectives (From admission, onward)   Start     Dose/Rate Route Frequency Ordered Stop   08/31/20 2345  cefTRIAXone  (ROCEPHIN) 2 g in sodium chloride 0.9 % 100 mL IVPB        2 g 200 mL/hr over 30 Minutes Intravenous Every 24 hours 08/31/20 2332     08/31/20 2345  metroNIDAZOLE (FLAGYL) IVPB 500 mg        500 mg 100 mL/hr over 60 Minutes Intravenous Every 8 hours 08/31/20 2332     08/31/20 2230  ciprofloxacin (CIPRO) IVPB 400 mg  Status:  Discontinued        400 mg 200 mL/hr over 60 Minutes Intravenous  Once 08/31/20 2217 08/31/20 2230   08/31/20 2230  metroNIDAZOLE (FLAGYL) IVPB 500 mg  Status:  Discontinued        500 mg 100 mL/hr over 60 Minutes Intravenous  Once 08/31/20 2217 08/31/20 2230   08/31/20 1930  cefTRIAXone (ROCEPHIN) 1 g in sodium chloride 0.9 % 100 mL IVPB  Status:  Discontinued        1 g 200 mL/hr over 30 Minutes Intravenous Every 24 hours 08/31/20 1918 09/01/20 1752       DVT prophylaxis: Heparin  Code Status: Full code  Family Communication: No family at bedside    Status is: Inpatient  Dispo: The patient is from: Home              Anticipated d/c is to: Home              Anticipated d/c date is: 09/04/2020              Patient currently not medically stable for discharge  Barrier to discharge-ongoing treatment for diarrhea, AKI     Consultants:    Procedures:     Objective   Vitals:   09/01/20 2110 09/02/20 0215 09/02/20 0607 09/02/20 0839  BP: (!) 128/52 (!) 127/57 137/63 (!) 125/52  Pulse: 83 81 87 89  Resp: 18 18 16    Temp: 98.2 F (36.8 C) 98 F (36.7 C) 97.9 F (36.6 C)   TempSrc: Oral Oral Oral   SpO2: 98% 98% 95%   Weight:      Height:        Intake/Output Summary (Last 24 hours) at 09/02/2020 1241 Last data filed at 09/02/2020 1213 Gross per 24 hour  Intake 3285.79 ml  Output 600 ml  Net 2685.79 ml    09/16 1901 - 09/18 0700 In: 4483.4 [I.V.:1742.2] Out: 600 [Urine:600]  Filed Weights   08/31/20 1941  Weight: 70.3 kg    Physical Examination:    General: Appears in no acute distress  Cardiovascular: S1-S2, regular, no  murmur auscultated  Respiratory: Clear to auscultation bilaterally  Abdomen: Abdomen is soft, nontender, no organomegaly  Extremities: No edema in the lower extremities  Neurologic: Alert, oriented x3, intact insight judgment, no focal deficit noted    Data Reviewed:   Recent Results (from the past 240 hour(s))  SARS Coronavirus 2 by RT PCR (hospital order, performed in [facility] hospital lab) Nasopharyngeal Nasopharyngeal Swab     Status: None   Collection Time: 08/31/20  3:37 PM   Specimen: Nasopharyngeal Swab  Result Value Ref Range Status   SARS Coronavirus 2 NEGATIVE NEGATIVE Final    Comment: (NOTE) SARS-CoV-2 target nucleic acids are NOT DETECTED.  The SARS-CoV-2 RNA is generally detectable in upper and lower respiratory specimens during the acute phase of infection. The lowest concentration of SARS-CoV-2 viral copies this assay can detect is 250 copies / mL. A negative result does not preclude SARS-CoV-2 infection and should not be used as the sole basis for treatment or other patient management decisions.  A negative result may occur with improper specimen collection / handling, submission of specimen other than nasopharyngeal swab, presence of viral mutation(s) within the areas targeted by this assay, and inadequate number of viral copies (<250 copies / mL). A negative result must be combined with clinical observations, patient history, and epidemiological information.  Fact Sheet for Patients:   StrictlyIdeas.no  Fact Sheet for Healthcare Providers: BankingDealers.co.za  This test is not yet approved or  cleared by the [place] FDA and has been authorized for detection and/or diagnosis of SARS-CoV-2 by FDA under an Emergency Use Authorization (EUA).  This EUA will remain in effect (meaning this test can be used) for the duration of the COVID-19 declaration under Section 564(b)(1) of the Act, 21  U.S.C. section 360bbb-3(b)(1), unless the authorization is terminated or revoked sooner.  Performed at [facility], [place] [address] [place], [place] [address]   Culture, blood (routine x 2)     Status: None (Preliminary result)   Collection Time: 08/31/20  6:52 PM   Specimen: BLOOD  Result Value Ref Range Status   Specimen Description BLOOD LEFT ANTECUBITAL  Final   Special Requests   Final    BOTTLES DRAWN AEROBIC AND ANAEROBIC Blood Culture results may not be optimal due to an inadequate volume of blood received in culture bottles   Culture   Final    NO GROWTH < 24 HOURS Performed at [facility], [place] [address], [place], [place] [address]    Report Status PENDING  Incomplete  Culture, blood (routine x 2)     Status: None (Preliminary result)   Collection Time: 08/31/20  7:26 PM   Specimen: BLOOD  Result Value Ref Range Status   Specimen Description BLOOD RIGHT ANTECUBITAL  Final   Special Requests   Final    BOTTLES DRAWN AEROBIC AND ANAEROBIC Blood Culture adequate volume   Culture   Final    NO GROWTH < 24 HOURS Performed at [facility], [place] [address], [place], [place] [address]    Report Status PENDING  Incomplete  Urine culture     Status: Abnormal   Collection Time: 08/31/20  7:49 PM   Specimen: Urine, Random  Result Value Ref Range Status   Specimen Description URINE, RANDOM  Final   Special Requests   Final    NONE Performed at [facility], [place] [address], [place], [place] [address]    Culture 60,000 COLONIES/mL ESCHERICHIA COLI (  A)  Final   Report Status 09/02/2020 FINAL  Final   Organism ID, Bacteria ESCHERICHIA COLI (A)  Final      Susceptibility   Escherichia coli - MIC*    AMPICILLIN 4 SENSITIVE Sensitive     CEFAZOLIN <=4 SENSITIVE Sensitive     CEFTRIAXONE <=0.25 SENSITIVE Sensitive     CIPROFLOXACIN <=0.25 SENSITIVE Sensitive     GENTAMICIN <=1 SENSITIVE Sensitive     IMIPENEM <=0.25 SENSITIVE Sensitive      NITROFURANTOIN <=16 SENSITIVE Sensitive     TRIMETH/SULFA <=20 SENSITIVE Sensitive     AMPICILLIN/SULBACTAM <=2 SENSITIVE Sensitive     PIP/TAZO <=4 SENSITIVE Sensitive     * 60,000 COLONIES/mL ESCHERICHIA COLI  Gastrointestinal Panel by PCR , Stool     Status: None   Collection Time: 09/01/20 10:45 AM   Specimen: Stool  Result Value Ref Range Status   Campylobacter species NOT DETECTED NOT DETECTED Final   Plesimonas shigelloides NOT DETECTED NOT DETECTED Final   Salmonella species NOT DETECTED NOT DETECTED Final   Yersinia enterocolitica NOT DETECTED NOT DETECTED Final   Vibrio species NOT DETECTED NOT DETECTED Final   Vibrio cholerae NOT DETECTED NOT DETECTED Final   Enteroaggregative E coli (EAEC) NOT DETECTED NOT DETECTED Final   Enteropathogenic E coli (EPEC) NOT DETECTED NOT DETECTED Final   Enterotoxigenic E coli (ETEC) NOT DETECTED NOT DETECTED Final   Shiga like toxin producing E coli (STEC) NOT DETECTED NOT DETECTED Final   Shigella/Enteroinvasive E coli (EIEC) NOT DETECTED NOT DETECTED Final   Cryptosporidium NOT DETECTED NOT DETECTED Final   Cyclospora cayetanensis NOT DETECTED NOT DETECTED Final   Entamoeba histolytica NOT DETECTED NOT DETECTED Final   Giardia lamblia NOT DETECTED NOT DETECTED Final   Adenovirus F40/41 NOT DETECTED NOT DETECTED Final   Astrovirus NOT DETECTED NOT DETECTED Final   Norovirus GI/GII NOT DETECTED NOT DETECTED Final   Rotavirus A NOT DETECTED NOT DETECTED Final   Sapovirus (I, II, IV, and V) NOT DETECTED NOT DETECTED Final    Comment: Performed at [facility], [address] [place], [place] [address]  C Difficile Quick Screen w PCR reflex     Status: None   Collection Time: 09/01/20 10:45 AM   Specimen: Stool  Result Value Ref Range Status   C Diff antigen NEGATIVE NEGATIVE Final   C Diff toxin NEGATIVE NEGATIVE Final   C Diff interpretation No C. difficile detected.  Final    Comment: Performed at [facility],  [place] [address] [place], [place] [address]    Recent Labs  Lab 08/31/20 2030  LIPASE 33   No results for input(s): AMMONIA in the last 168 hours.  Cardiac Enzymes: No results for input(s): CKTOTAL, CKMB, CKMBINDEX, TROPONINI in the last 168 hours. BNP (last 3 results) No results for input(s): BNP in the last 8760 hours.  ProBNP (last 3 results) No results for input(s): PROBNP in the last 8760 hours.  Studies:  CT ABDOMEN PELVIS WO CONTRAST  Result Date: 08/31/2020 CLINICAL DATA:  Acute abdominal pain, nonlocalized. White blood cell count 18 EXAM: CT ABDOMEN AND PELVIS WITHOUT CONTRAST TECHNIQUE: Multidetector CT imaging of the abdomen and pelvis was performed following the standard protocol without IV contrast. COMPARISON:  CT abdomen pelvis 07/25/2014 FINDINGS: Lower chest: Pulmonary micronodule within the lingula. Bibasilar subsegmental atelectasis. Coronary artery calcifications. No significant pericardial effusion. Hepatobiliary: Similar appearing 4.4 cm fluid density lesion within the left hepatic lobe likely represents a simple hepatic cyst.  No new focal liver abnormality is seen. Status post cholecystectomy. No biliary dilatation. Pancreas: Unremarkable. No pancreatic ductal dilatation or surrounding inflammatory changes. Spleen: Normal in size without focal abnormality. Adrenals/Urinary Tract: Similar-appearing 9 mm hypodense nodule within left adrenal gland consistent with adrenal adenoma. Otherwise adrenals are unremarkable. Bilateral renal cortical scarring. No renal calculi, focal lesion, or hydronephrosis. No hydroureter or ureterolithiasis. The urinary bladder is unremarkable. Stomach/Bowel: Stomach is within normal limits. The appendix is not definitely identified. No evidence of bowel wall thickening, distention, or inflammatory changes. The colon is under distended. There pericolonic fat stranding surrounding the sigmoid colon. No diverticula identified. The remainder of the colon is  unremarkable. No pneumatosis. Vascular/Lymphatic: No abdominal aorta or iliac aneurysm. Moderate atherosclerotic plaque of the aorta and its branches. No abdominal, pelvic, or inguinal lymphadenopathy. Reproductive: Status post hysterectomy. No adnexal masses. Other: No intraperitoneal free fluid. No intraperitoneal free gas. No organized fluid collection. Musculoskeletal: No abdominal wall hernia or abnormality. Nonspecific coarse calcifications of the visualized breasts. No suspicious lytic or blastic osseous lesions. No acute displaced fracture. Multilevel degenerative changes of the spine. IMPRESSION: 1. Mild inflammatory changes of the sigmoid colon with no diverticula to suggest diverticulitis. Findings likely represent colitis. Etiology may be infectious, inflammatory, ischemic. 2.  Aortic Atherosclerosis (ICD10-I70.0). Electronically Signed   By: Iven Finn M.D.   On: 08/31/2020 22:10   DG Chest Portable 1 View  Result Date: 08/31/2020 CLINICAL DATA:  Possible COVID EXAM: PORTABLE CHEST 1 VIEW COMPARISON:  CT 02/19/2019, radiograph 03/23/2016 FINDINGS: Coarsened interstitial changes and bronchitic features are similar to comparison radiography. No consolidation, features of edema, pneumothorax, or effusion. The aorta is calcified. The remaining cardiomediastinal contours are unremarkable. No acute osseous or soft tissue abnormality. Degenerative changes are present in the imaged spine and shoulders. Chronic elevation of the distal left clavicle relative to the acromion may reflect remote [facility] joint injury. IMPRESSION: 1. No acute cardiopulmonary findings. 2. Chronic interstitial and bronchitic features. Electronically Signed   By: Lovena Le M.D.   On: 08/31/2020 15:53       [place]   [facility] If 7PM-7AM, please contact night-coverage at [email or web address], Office  [phone]   09/02/2020, 12:41 PM  LOS: 2 days

## 2020-09-03 LAB — GLUCOSE, CAPILLARY
Glucose-Capillary: 125 mg/dL — ABNORMAL HIGH (ref 70–99)
Glucose-Capillary: 115 mg/dL — ABNORMAL HIGH (ref 70–99)

## 2020-09-03 LAB — CBC WITH DIFFERENTIAL/PLATELET
Abs Immature Granulocytes: 0.17 10*3/uL — ABNORMAL HIGH (ref 0.00–0.07)
Basophils Absolute: 0 10*3/uL (ref 0.0–0.1)
Basophils Relative: 0 %
Eosinophils Absolute: 0.1 10*3/uL (ref 0.0–0.5)
Eosinophils Relative: 1 %
HCT: 33.4 % — ABNORMAL LOW (ref 36.0–46.0)
Hemoglobin: 10.9 g/dL — ABNORMAL LOW (ref 12.0–15.0)
Immature Granulocytes: 3 %
Lymphocytes Relative: 8 %
Lymphs Abs: 0.5 10*3/uL — ABNORMAL LOW (ref 0.7–4.0)
MCH: 29.9 pg (ref 26.0–34.0)
MCHC: 32.6 g/dL (ref 30.0–36.0)
MCV: 91.8 fL (ref 80.0–100.0)
Monocytes Absolute: 0.3 10*3/uL (ref 0.1–1.0)
Monocytes Relative: 5 %
Neutro Abs: 5.8 10*3/uL (ref 1.7–7.7)
Neutrophils Relative %: 83 %
Platelets: 119 10*3/uL — ABNORMAL LOW (ref 150–400)
RBC: 3.64 MIL/uL — ABNORMAL LOW (ref 3.87–5.11)
RDW: 14 % (ref 11.5–15.5)
WBC: 6.8 10*3/uL (ref 4.0–10.5)
nRBC: 0 % (ref 0.0–0.2)

## 2020-09-03 LAB — BASIC METABOLIC PANEL
Anion gap: 10 (ref 5–15)
BUN: 10 mg/dL (ref 8–23)
CO2: 22 mmol/L (ref 22–32)
Calcium: 8 mg/dL — ABNORMAL LOW (ref 8.9–10.3)
Chloride: 106 mmol/L (ref 98–111)
Creatinine, Ser: 0.93 mg/dL (ref 0.44–1.00)
GFR calc Af Amer: >60 mL/min (ref >60)
GFR calc non Af Amer: 58 mL/min — ABNORMAL LOW (ref >60)
Glucose, Bld: 131 mg/dL — ABNORMAL HIGH (ref 70–99)
Potassium: 4.2 mmol/L (ref 3.5–5.1)
Sodium: 138 mmol/L (ref 135–145)

## 2020-09-03 MED ORDER — METRONIDAZOLE 500 MG PO TABS: 500.0000 mg | ORAL_TABLET | Freq: Three times a day (TID) | ORAL | 0 refills | Status: AC

## 2020-09-03 MED ORDER — CEFDINIR 300 MG PO CAPS
300.0000 mg | ORAL_CAPSULE | Freq: Two times a day (BID) | ORAL | Status: DC
  Administered 2020-09-03: 300 mg via ORAL
  Filled 2020-09-03 (×2): qty 1

## 2020-09-03 MED ORDER — CEFDINIR 300 MG PO CAPS: 300.0000 mg | ORAL_CAPSULE | Freq: Two times a day (BID) | ORAL | 0 refills | Status: DC

## 2020-09-03 MED ORDER — METRONIDAZOLE IN NACL 5-0.79 MG/ML-% IV SOLN: 500.0000 mg | Freq: Three times a day (TID) | INTRAVENOUS | Status: DC

## 2020-09-03 MED ORDER — LISINOPRIL 20 MG PO TABS
20.0000 mg | ORAL_TABLET | Freq: Every day | ORAL | 2 refills | Status: DC
Start: 2020-09-04 — End: 2020-09-26

## 2020-09-03 MED ORDER — METOPROLOL SUCCINATE ER 50 MG PO TB24
75.0000 mg | ORAL_TABLET | Freq: Every day | ORAL | Status: AC
  Administered 2020-09-03: 75 mg via ORAL
  Filled 2020-09-03: qty 1

## 2020-09-03 MED ORDER — METRONIDAZOLE 500 MG PO TABS
500.0000 mg | ORAL_TABLET | Freq: Once | ORAL | Status: AC
  Administered 2020-09-03: 500 mg via ORAL
  Filled 2020-09-03: qty 1

## 2020-09-03 NOTE — TOC Transition Note (Signed)
Transition of Care [facility]) - CM/SW Discharge Note   Patient Details  Name: Shondrea Steinert MRN: [id number] Date of Birth: Dec 23, 1940  [facility] CM/SW Contact:  Carles Collet, RN Phone Number: 09/03/2020, 2:09 PM   Clinical Narrative:   Damaris Schooner w patient and spouse at bedside. Patient would like to go home w [facility] PT. Discussed DME, she has all needed DME at home. Referral placed to [facility] at her request, accepted by [facility]. No other CM needs identified.     Final next level of care: [place] Barriers to Discharge: No Barriers Identified   Patient Goals and CMS Choice Patient states their goals for this hospitalization and ongoing recovery are:: to go home CMS Medicare.gov Compare Post Acute Care list provided to:: Patient Choice offered to / list presented to : Patient  Discharge Placement                       Discharge Plan and Services                          HH Arranged: PT [facility] Agency: [place] ([place]) Date [place]: 09/03/20 Time [place]: [place] Representative spoke with at [place]: [place] ([place]) Interventions     Readmission Risk Interventions No flowsheet data found.

## 2020-09-03 NOTE — Evaluation (Signed)
Physical Therapy Evaluation Patient Details Name: Anna Stone MRN: [id number] DOB: [birth year] Today's Date: 09/03/2020   History of Present Illness  Patient is a 78 y/o female who was sent from her PCP due to not feeling well, diarrhea and weakness. CT abdomen-colitis. Admitted with sepsis and AKI.  PMH includes DM2, CKD, sleep apnea, back surgery.  Clinical Impression  Patient presents with generalized weakness, decreased activity tolerance, impaired balance and impaired mobility s/p above. Pt lives at home with spouse and reports using rollator PRN for mobility PTA and doing her own ADLs. Today, pt requires Mod A for bed mobility, Max A to stand and Min A for short distance ambulation with use of RW for support. Limited due to weakness in BLEs and fatigue. Pt anxious about mobility. Pt would be a great Home First candidate as she has support from her spouse at home however reports he had his knees replaced. Would benefit from SNF to maximize independence and mobility prior to return home. Will follow acutely.    Follow Up Recommendations SNF;Supervision for mobility/OOB (Home First)    Equipment Recommendations  3in1 (PT) (if pt decides to return home)    Recommendations for Other Services       Precautions / Restrictions Precautions Precautions: Fall Precaution Comments: hx of falls Restrictions Weight Bearing Restrictions: No      Mobility  Bed Mobility Overal bed mobility: Needs Assistance Bed Mobility: Rolling;Sidelying to Sit Rolling: Supervision Sidelying to sit: Mod assist;HOB elevated       General bed mobility comments: Assist with trunk and scooting bottom to get to EOB. + dizziness which resolved.  Transfers Overall transfer level: Needs assistance Equipment used: Rolling walker (2 wheeled) Transfers: Sit to/from Stand Sit to Stand: Max assist         General transfer comment: Heavy assist to power to standing with cues for hand placement,  technique and use of momentum. Transferred to chair post ambulation.  Ambulation/Gait Ambulation/Gait assistance: Min assist Gait Distance (Feet): 12 Feet Assistive device: Rolling walker (2 wheeled) Gait Pattern/deviations: Step-to pattern;Step-through pattern;Decreased stride length Gait velocity: decreased Gait velocity interpretation: <1.31 ft/sec, indicative of household ambulator General Gait Details: Slow, mildly unsteady gait with bil knee instability, Min A for balance and RW management. Fatigues. HR irregular in low 100 to 117 bpm.  Stairs            Wheelchair Mobility    Modified Rankin (Stroke Patients Only)       Balance Overall balance assessment: Needs assistance Sitting-balance support: Feet supported;No upper extremity supported Sitting balance-Leahy Scale: Good Sitting balance - Comments: supervision for safety.   Standing balance support: During functional activity Standing balance-Leahy Scale: Poor Standing balance comment: Requires UE support and external support initially.                             Pertinent Vitals/Pain Pain Assessment: No/denies pain    Home Living Family/patient expects to be discharged to:: Private residence Living Arrangements: Spouse/significant other Available Help at Discharge: Family;Available 24 hours/day Type of Home: House Home Access: Ramped entrance     Home Layout: Two level;Able to live on main level with bedroom/bathroom Home Equipment: Shower seat - built in;Walker - 4 wheels;Bedside commode;Cane - single point;Walker - 2 wheels      Prior Function Level of Independence: Independent with assistive device(s)         Comments: uses rollator PRN, does own ADLs.  No driving. Does not have to do any IADLs. Reports 3-4 falls in last 6 months.     Hand Dominance   Dominant Hand: Right    Extremity/Trunk Assessment   Upper Extremity Assessment Upper Extremity Assessment: Defer to OT  evaluation    Lower Extremity Assessment Lower Extremity Assessment: Generalized weakness (Grossly ~3/5 throughout)       Communication   Communication: No difficulties  Cognition Arousal/Alertness: Awake/alert Behavior During Therapy: WFL for tasks assessed/performed;Anxious Overall Cognitive Status: Within Functional Limits for tasks assessed                                        General Comments General comments (skin integrity, edema, etc.): HR irregular.    Exercises     Assessment/Plan    PT Assessment Patient needs continued PT services  PT Problem List Decreased strength;Decreased mobility;Decreased balance;Decreased safety awareness;Decreased activity tolerance       PT Treatment Interventions Therapeutic activities;Gait training;Therapeutic exercise;Patient/family education;Balance training;Functional mobility training    PT Goals (Current goals can be found in the Care Plan section)  Acute Rehab PT Goals Patient Stated Goal: to go to rehab to be able to take care of myself PT Goal Formulation: With patient Time For Goal Achievement: 09/17/20 Potential to Achieve Goals: Fair    Frequency Min 3X/week   Barriers to discharge Decreased caregiver support      Co-evaluation               AM-PAC PT "6 Clicks" Mobility  Outcome Measure Help needed turning from your back to your side while in a flat bed without using bedrails?: A Little Help needed moving from lying on your back to sitting on the side of a flat bed without using bedrails?: Total Help needed moving to and from a bed to a chair (including a wheelchair)?: A Lot Help needed standing up from a chair using your arms (e.g., wheelchair or bedside chair)?: Total Help needed to walk in hospital room?: A Little Help needed climbing 3-5 steps with a railing? : A Lot 6 Click Score: 12    End of Session Equipment Utilized During Treatment: Gait belt Activity Tolerance: Patient  tolerated treatment well Patient left: in chair;with call bell/phone within reach Nurse Communication: Mobility status PT Visit Diagnosis: Muscle weakness (generalized) (M62.81);Difficulty in walking, not elsewhere classified (R26.2)    Time: 5053-9767 PT Time Calculation (min) (ACUTE ONLY): 23 min   Charges:   PT Evaluation $PT Eval Moderate Complexity: 1 Mod PT Treatments $Therapeutic Activity: 8-22 mins        Marisa Severin, PT, DPT Acute Rehabilitation Services Pager [phone] Office [phone]      Marguarite Arbour A Sabra Heck 09/03/2020, 11:21 AM

## 2020-09-03 NOTE — Discharge Summary (Addendum)
Physician Discharge Summary  Anna Stone [place] TDD:[id number] DOB: 01/18/1941 DOA: 08/31/2020  PCP: Marco Collie, MD  Admit date: 08/31/2020 Discharge date: 09/03/2020  Time spent: 50 minutes  Recommendations for Outpatient Follow-up:  1. Follow up PCP in one week 2. Patient to go home with home health PT.  Patient refusing to go to  skilled nursing facility.  Discharge Diagnoses:  Principal Problem:   Colitis Active Problems:  Severe sepsis ([place])   AKI (acute kidney injury) ([place])   Type 2 diabetes mellitus with stage 3 chronic kidney disease ([place])   Essential hypertension   Leukocytosis   Discharge Condition: Stable  Diet recommendation: Heart healthy diet  Filed Weights   08/31/20 1941  Weight: 70.3 kg    History of present illness:  79 year old female with history of diabetes mellitus type 2, CKD stage III, hypertension, IBS, depression presented with generalized weakness and diarrhea.  In the ED CT scan of the abdomen showed sigmoid colitis with no diverticulitis.  Stool for C. difficile PCR was ordered.  Patient given Rocephin and Flagyl in the ED.  Hospital Course:   1. Severe sepsis-patient presented with leukocytosis, tachycardia, tachypnea with colitis on CT scan.  Initial lactic  acid was elevated at 3.6.  Severe sepsis physiology has resolved.  Lactic acid is down to 1.5 with IV fluids.   WBC is down to 6.8 2. Colitis-resolved, stool for C. difficile PCR is negative.  GI pathogen panel is negative. Patient improved with ceftriaxone and Flagyl.  Tolerating diet.  Will discharge home on Omnicef 3 mg p.o. twice daily for 3 days, Flagyl 500 mg p.o. 3 times daily for 3 days. 3. Diabetes mellitus type II-CBG well controlled,  continue home regimen. 4. Hypertension-continue amlodipine, lisinopril.    Will discontinue HCTZ due to renal insufficiency.  Continue Aldactone, Lasix. 5. Acute kidney injury-resolved, patient baseline creatinine around 1.3 as of 2017.  She  presented with creatinine of 2.03, improved to 0.93 with IV fluids.   6. Hypokalemia/hypomagnesemia-replete, today potassium is 4.2 7. NSVT-patient had NSVT during hospital stay, due to hypomagnesemia and hypokalemia.  These medications have been replaced.  Metoprolol will be restarted at home dose of 100 mg p.o. daily.   Procedures:  Consultations:    Discharge Exam: Vitals:   09/03/20 1009 09/03/20 1135  BP: 132/65 (!) 149/73  Pulse: (!) 102 (!) 105  Resp:  17  Temp:  98.2 F (36.8 C)  SpO2:  99%    General: Appears in no acute distress Cardiovascular: S1-S2, regular Respiratory: Clear to auscultation bilaterally  Discharge Instructions   Discharge Instructions    Diet - low sodium heart healthy   Complete by: As directed    Increase activity slowly   Complete by: As directed      Allergies as of 09/03/2020      Reactions   Sulfa Antibiotics Itching, Rash   Adhesive [tape] Dermatitis   Blisters the skin      Medication List    STOP taking these medications   lisinopril-hydrochlorothiazide 20-12.5 MG tablet Commonly known as: ZESTORETIC   predniSONE 20 MG tablet Commonly known as: DELTASONE     TAKE these medications   amLODipine 10 MG tablet Commonly known as: NORVASC Take 10 mg by mouth daily.   aspirin 325 MG tablet Take 325 mg by mouth daily.   betamethasone valerate 0.1 % cream Commonly known as: VALISONE Apply 1 application topically 2 (two) times daily. Apply to legs   cefdinir 300 MG  capsule Commonly known as: OMNICEF Take 1 capsule (300 mg total) by mouth every 12 (twelve) hours.   doxazosin 1 MG tablet Commonly known as: CARDURA Take 1 mg by mouth at bedtime.   EPINEPHrine 0.3 mg/0.3 mL Soaj injection Commonly known as: EPI-PEN Inject 0.3 mg into the muscle once as needed for anaphylaxis.   fluticasone 50 MCG/ACT nasal spray Commonly known as: FLONASE Place 2 sprays into both nostrils daily.   furosemide 20 MG  tablet Commonly known as: LASIX Take 20 mg by mouth daily.   gabapentin 600 MG tablet Commonly known as: NEURONTIN Take 600 mg by mouth 2 (two) times daily.   lisinopril 20 MG tablet Commonly known as: ZESTRIL Take 1 tablet (20 mg total) by mouth daily. Start taking on: September 04, 2020   metoprolol succinate 100 MG 24 hr tablet Commonly known as: TOPROL-XL Take 100 mg by mouth daily. What changed: Another medication with the same name was removed. Continue taking this medication, and follow the directions you see here.   metroNIDAZOLE 500 MG tablet Commonly known as: Flagyl Take 1 tablet (500 mg total) by mouth 3 (three) times daily for 3 days.   omeprazole 20 MG capsule Commonly known as: PRILOSEC Take 20 mg by mouth daily.   ondansetron 8 MG tablet Commonly known as: ZOFRAN Take 8 mg by mouth every 8 (eight) hours as needed for nausea or vomiting.   pioglitazone 30 MG tablet Commonly known as: ACTOS Take 30 mg by mouth daily.   REFRESH PLUS OP Place 1 drop into both eyes 2 (two) times daily as needed (dry eyes).   rOPINIRole 2 MG tablet Commonly known as: REQUIP Take 2 mg by mouth at bedtime.   rosuvastatin 5 MG tablet Commonly known as: CRESTOR Take 5 mg by mouth at bedtime.   spironolactone 50 MG tablet Commonly known as: ALDACTONE Take 50 mg by mouth daily.      Allergies  Allergen Reactions  . Sulfa Antibiotics Itching and Rash  . Adhesive [Tape] Dermatitis    Blisters the skin    Follow-up Information    Marco Collie, MD Follow up in 1 week(s).   Specialty: Family Medicine Contact information: [address] [place] [address] [phone]                The results of significant diagnostics from this hospitalization (including imaging, microbiology, ancillary and laboratory) are listed below for reference.    Significant Diagnostic Studies: CT ABDOMEN PELVIS WO CONTRAST  Result Date: 08/31/2020 CLINICAL  DATA:  Acute abdominal pain, nonlocalized. White blood cell count 18 EXAM: CT ABDOMEN AND PELVIS WITHOUT CONTRAST TECHNIQUE: Multidetector CT imaging of the abdomen and pelvis was performed following the standard protocol without IV contrast. COMPARISON:  CT abdomen pelvis 07/25/2014 FINDINGS: Lower chest: Pulmonary micronodule within the lingula. Bibasilar subsegmental atelectasis. Coronary artery calcifications. No significant pericardial effusion. Hepatobiliary: Similar appearing 4.4 cm fluid density lesion within the left hepatic lobe likely represents a simple hepatic cyst. No new focal liver abnormality is seen. Status post cholecystectomy. No biliary dilatation. Pancreas: Unremarkable. No pancreatic ductal dilatation or surrounding inflammatory changes. Spleen: Normal in size without focal abnormality. Adrenals/Urinary Tract: Similar-appearing 9 mm hypodense nodule within left adrenal gland consistent with adrenal adenoma. Otherwise adrenals are unremarkable. Bilateral renal cortical scarring. No renal calculi, focal lesion, or hydronephrosis. No hydroureter or ureterolithiasis. The urinary bladder is unremarkable. Stomach/Bowel: Stomach is within normal limits. The appendix is not definitely identified. No evidence  of bowel wall thickening, distention, or inflammatory changes. The colon is under distended. There pericolonic fat stranding surrounding the sigmoid colon. No diverticula identified. The remainder of the colon is unremarkable. No pneumatosis. Vascular/Lymphatic: No abdominal aorta or iliac aneurysm. Moderate atherosclerotic plaque of the aorta and its branches. No abdominal, pelvic, or inguinal lymphadenopathy. Reproductive: Status post hysterectomy. No adnexal masses. Other: No intraperitoneal free fluid. No intraperitoneal free gas. No organized fluid collection. Musculoskeletal: No abdominal wall hernia or abnormality. Nonspecific coarse calcifications of the visualized breasts. No suspicious  lytic or blastic osseous lesions. No acute displaced fracture. Multilevel degenerative changes of the spine. IMPRESSION: 1. Mild inflammatory changes of the sigmoid colon with no diverticula to suggest diverticulitis. Findings likely represent colitis. Etiology may be infectious, inflammatory, ischemic. 2.  Aortic Atherosclerosis (ICD10-I70.0). Electronically Signed   By: Iven Finn M.D.   On: 08/31/2020 22:10   DG Chest Portable 1 View  Result Date: 08/31/2020 CLINICAL DATA:  Possible COVID EXAM: PORTABLE CHEST 1 VIEW COMPARISON:  CT 02/19/2019, radiograph 03/23/2016 FINDINGS: Coarsened interstitial changes and bronchitic features are similar to comparison radiography. No consolidation, features of edema, pneumothorax, or effusion. The aorta is calcified. The remaining cardiomediastinal contours are unremarkable. No acute osseous or soft tissue abnormality. Degenerative changes are present in the imaged spine and shoulders. Chronic elevation of the distal left clavicle relative to the acromion may reflect remote [facility] joint injury. IMPRESSION: 1. No acute cardiopulmonary findings. 2. Chronic interstitial and bronchitic features. Electronically Signed   By: Lovena Le M.D.   On: 08/31/2020 15:53    Microbiology: Recent Results (from the past 240 hour(s))  SARS Coronavirus 2 by RT PCR (hospital order, performed in [facility] hospital lab) Nasopharyngeal Nasopharyngeal Swab     Status: None   Collection Time: 08/31/20  3:37 PM   Specimen: Nasopharyngeal Swab  Result Value Ref Range Status   SARS Coronavirus 2 NEGATIVE NEGATIVE Final    Comment: (NOTE) SARS-CoV-2 target nucleic acids are NOT DETECTED.  The SARS-CoV-2 RNA is generally detectable in upper and lower respiratory specimens during the acute phase of infection. The lowest concentration of SARS-CoV-2 viral copies this assay can detect is 250 copies / mL. A negative result does not preclude SARS-CoV-2 infection and should not be used  as the sole basis for treatment or other patient management decisions.  A negative result may occur with improper specimen collection / handling, submission of specimen other than nasopharyngeal swab, presence of viral mutation(s) within the areas targeted by this assay, and inadequate number of viral copies (<250 copies / mL). A negative result must be combined with clinical observations, patient history, and epidemiological information.  Fact Sheet for Patients:   StrictlyIdeas.no  Fact Sheet for Healthcare Providers: BankingDealers.co.za  This test is not yet approved or  cleared by the [place] FDA and has been authorized for detection and/or diagnosis of SARS-CoV-2 by FDA under an Emergency Use Authorization (EUA).  This EUA will remain in effect (meaning this test can be used) for the duration of the COVID-19 declaration under Section 564(b)(1) of the Act, 21 U.S.C. section 360bbb-3(b)(1), unless the authorization is terminated or revoked sooner.  Performed at [facility], [place] [address] [place],  [address]   Culture, blood (routine x 2)     Status: None (Preliminary result)   Collection Time: 08/31/20  6:52 PM   Specimen: BLOOD  Result Value Ref Range Status   Specimen Description BLOOD LEFT ANTECUBITAL  Final  Special Requests   Final    BOTTLES DRAWN AEROBIC AND ANAEROBIC Blood Culture results may not be optimal due to an inadequate volume of blood received in culture bottles   Culture   Final    NO GROWTH 2 DAYS Performed at [facility], [place] [address] [place], [place] [address]    Report Status PENDING  Incomplete  Culture, blood (routine x 2)     Status: None (Preliminary result)   Collection Time: 08/31/20  7:26 PM   Specimen: BLOOD  Result Value Ref Range Status   Specimen Description BLOOD RIGHT ANTECUBITAL  Final   Special Requests   Final    BOTTLES DRAWN AEROBIC AND ANAEROBIC Blood  Culture adequate volume   Culture   Final    NO GROWTH 2 DAYS Performed at [facility], [place] [address], [place], [place] [address]    Report Status PENDING  Incomplete  Urine culture     Status: Abnormal   Collection Time: 08/31/20  7:49 PM   Specimen: Urine, Random  Result Value Ref Range Status   Specimen Description URINE, RANDOM  Final   Special Requests   Final    NONE Performed at [facility], [place] [address], [place], [place] [address]    Culture 60,000 COLONIES/mL ESCHERICHIA COLI (A)  Final   Report Status 09/02/2020 FINAL  Final   Organism ID, Bacteria ESCHERICHIA COLI (A)  Final      Susceptibility   Escherichia coli - MIC*    AMPICILLIN 4 SENSITIVE Sensitive     CEFAZOLIN <=4 SENSITIVE Sensitive     CEFTRIAXONE <=0.25 SENSITIVE Sensitive     CIPROFLOXACIN <=0.25 SENSITIVE Sensitive     GENTAMICIN <=1 SENSITIVE Sensitive     IMIPENEM <=0.25 SENSITIVE Sensitive     NITROFURANTOIN <=16 SENSITIVE Sensitive     TRIMETH/SULFA <=20 SENSITIVE Sensitive     AMPICILLIN/SULBACTAM <=2 SENSITIVE Sensitive     PIP/TAZO <=4 SENSITIVE Sensitive     * 60,000 COLONIES/mL ESCHERICHIA COLI  Gastrointestinal Panel by PCR , Stool     Status: None   Collection Time: 09/01/20 10:45 AM   Specimen: Stool  Result Value Ref Range Status   Campylobacter species NOT DETECTED NOT DETECTED Final   Plesimonas shigelloides NOT DETECTED NOT DETECTED Final   Salmonella species NOT DETECTED NOT DETECTED Final   Yersinia enterocolitica NOT DETECTED NOT DETECTED Final   Vibrio species NOT DETECTED NOT DETECTED Final   Vibrio cholerae NOT DETECTED NOT DETECTED Final   Enteroaggregative E coli (EAEC) NOT DETECTED NOT DETECTED Final   Enteropathogenic E coli (EPEC) NOT DETECTED NOT DETECTED Final   Enterotoxigenic E coli (ETEC) NOT DETECTED NOT DETECTED Final   Shiga like toxin producing E coli (STEC) NOT DETECTED NOT DETECTED Final   Shigella/Enteroinvasive E coli (EIEC) NOT DETECTED NOT  DETECTED Final   Cryptosporidium NOT DETECTED NOT DETECTED Final   Cyclospora cayetanensis NOT DETECTED NOT DETECTED Final   Entamoeba histolytica NOT DETECTED NOT DETECTED Final   Giardia lamblia NOT DETECTED NOT DETECTED Final   Adenovirus F40/41 NOT DETECTED NOT DETECTED Final   Astrovirus NOT DETECTED NOT DETECTED Final   Norovirus GI/GII NOT DETECTED NOT DETECTED Final   Rotavirus A NOT DETECTED NOT DETECTED Final   Sapovirus (I, II, IV, and V) NOT DETECTED NOT DETECTED Final    Comment: Performed at [facility], [address] [place], [place] [address]  C Difficile Quick Screen w PCR reflex  Status: None   Collection Time: 09/01/20 10:45 AM   Specimen: Stool  Result Value Ref Range Status   C Diff antigen NEGATIVE NEGATIVE Final   C Diff toxin NEGATIVE NEGATIVE Final   C Diff interpretation No C. difficile detected.  Final    Comment: Performed at [facility], [place] [address], [place], [place] [address]     Labs: Basic Metabolic Panel: Recent Labs  Lab 08/31/20 1542 09/01/20 0504 09/02/20 0259 09/03/20 0102  NA 134* 133* 138 138  K 4.0 3.7 3.4* 4.2  CL 92* 96* 103 106  CO2 25 26 24 22   GLUCOSE 209* 135* 75 131*  BUN 34* 24* 13 10  CREATININE 2.03* 1.23* 1.01* 0.93  CALCIUM 8.5* 7.8* 7.9* 8.0*  MG  --   --  1.7  --    Liver Function Tests: Recent Labs  Lab 08/31/20 1542 09/02/20 0259  AST 33 21  ALT 35 25  ALKPHOS 47 26*  BILITOT 1.5* 0.7  PROT 5.7* 4.1*  ALBUMIN 3.3* 2.1*   Recent Labs  Lab 08/31/20 2030  LIPASE 33   CBC: Recent Labs  Lab 08/31/20 1542 09/01/20 0504 09/02/20 0259 09/03/20 0102  WBC 18.4* 18.9* 9.8 6.8  NEUTROABS  --   --   --  5.8  HGB 15.7* 12.2 10.7* 10.9*  HCT 47.5* 36.5 32.9* 33.4*  MCV 90.6 89.9 91.6 91.8  PLT 166 124* 106* 119*    CBG: Recent Labs  Lab 09/02/20 0608 09/02/20 1054 09/02/20 1611 09/03/20 0624 09/03/20 1112  GLUCAP 55* 99 137* 125* 115*       Signed:  Oswald Hillock  MD.  [facility] 09/03/2020, 1:24 PM

## 2020-09-03 NOTE — Progress Notes (Signed)
MD aware of loss of IV access, to switch IV antibiotics to PO  Pt states she wants to go home with home health  Informed PT, PT aware and looking into [facility] aware  Pt and spouse aware of plan of care Will continue to monitor

## 2020-09-03 NOTE — Progress Notes (Signed)
Pt telemetry removed, CCMD aware Pt discharge education provided at bedside to pt and pt husband  Pt has all belongings  Pt denies rehab and would like home health  Case management came to bedside to speak with pt  Pt discharged via wheelchair with RN

## 2020-09-03 NOTE — Progress Notes (Signed)
12:15 Received call from RN to assess IV site for infiltration. RAC very bruised. Warm to touch. Pt. Denies pain when palpated. Small area above old IV site in RAC slightly firm but not hard. Area to left of AV slightly swollen. No pitting edema noted.

## 2020-09-04 DIAGNOSIS — Z7982 Long term (current) use of aspirin: Secondary | ICD-10-CM | POA: Diagnosis not present

## 2020-09-04 DIAGNOSIS — K589 Irritable bowel syndrome without diarrhea: Secondary | ICD-10-CM | POA: Diagnosis not present

## 2020-09-04 DIAGNOSIS — K523 Indeterminate colitis: Secondary | ICD-10-CM | POA: Diagnosis not present

## 2020-09-04 DIAGNOSIS — Z9181 History of falling: Secondary | ICD-10-CM | POA: Diagnosis not present

## 2020-09-04 DIAGNOSIS — I129 Hypertensive chronic kidney disease with stage 1 through stage 4 chronic kidney disease, or unspecified chronic kidney disease: Secondary | ICD-10-CM | POA: Diagnosis not present

## 2020-09-04 DIAGNOSIS — N183 Chronic kidney disease, stage 3 unspecified: Secondary | ICD-10-CM | POA: Diagnosis not present

## 2020-09-04 DIAGNOSIS — Z993 Dependence on wheelchair: Secondary | ICD-10-CM | POA: Diagnosis not present

## 2020-09-04 DIAGNOSIS — Z7984 Long term (current) use of oral hypoglycemic drugs: Secondary | ICD-10-CM | POA: Diagnosis not present

## 2020-09-04 DIAGNOSIS — E1122 Type 2 diabetes mellitus with diabetic chronic kidney disease: Secondary | ICD-10-CM | POA: Diagnosis not present

## 2020-09-04 DIAGNOSIS — I472 Ventricular tachycardia: Secondary | ICD-10-CM | POA: Diagnosis not present

## 2020-09-04 DIAGNOSIS — Z9981 Dependence on supplemental oxygen: Secondary | ICD-10-CM | POA: Diagnosis not present

## 2020-09-04 DIAGNOSIS — K529 Noninfective gastroenteritis and colitis, unspecified: Secondary | ICD-10-CM | POA: Diagnosis not present

## 2020-09-04 DIAGNOSIS — F329 Major depressive disorder, single episode, unspecified: Secondary | ICD-10-CM | POA: Diagnosis not present

## 2020-09-04 LAB — GLUCOSE, CAPILLARY
Glucose-Capillary: 99 mg/dL (ref 70–99)
Glucose-Capillary: 132 mg/dL — ABNORMAL HIGH (ref 70–99)

## 2020-09-05 LAB — CULTURE, BLOOD (ROUTINE X 2)
Culture: NO GROWTH
Culture: NO GROWTH
Special Requests: ADEQUATE

## 2020-09-07 DIAGNOSIS — E1122 Type 2 diabetes mellitus with diabetic chronic kidney disease: Secondary | ICD-10-CM | POA: Diagnosis not present

## 2020-09-07 DIAGNOSIS — F329 Major depressive disorder, single episode, unspecified: Secondary | ICD-10-CM | POA: Diagnosis not present

## 2020-09-07 DIAGNOSIS — N183 Chronic kidney disease, stage 3 unspecified: Secondary | ICD-10-CM | POA: Diagnosis not present

## 2020-09-07 DIAGNOSIS — K529 Noninfective gastroenteritis and colitis, unspecified: Secondary | ICD-10-CM | POA: Diagnosis not present

## 2020-09-07 DIAGNOSIS — K589 Irritable bowel syndrome without diarrhea: Secondary | ICD-10-CM | POA: Diagnosis not present

## 2020-09-07 DIAGNOSIS — I129 Hypertensive chronic kidney disease with stage 1 through stage 4 chronic kidney disease, or unspecified chronic kidney disease: Secondary | ICD-10-CM | POA: Diagnosis not present

## 2020-09-11 DIAGNOSIS — N183 Chronic kidney disease, stage 3 unspecified: Secondary | ICD-10-CM | POA: Diagnosis not present

## 2020-09-11 DIAGNOSIS — I129 Hypertensive chronic kidney disease with stage 1 through stage 4 chronic kidney disease, or unspecified chronic kidney disease: Secondary | ICD-10-CM | POA: Diagnosis not present

## 2020-09-11 DIAGNOSIS — E1122 Type 2 diabetes mellitus with diabetic chronic kidney disease: Secondary | ICD-10-CM | POA: Diagnosis not present

## 2020-09-11 DIAGNOSIS — F329 Major depressive disorder, single episode, unspecified: Secondary | ICD-10-CM | POA: Diagnosis not present

## 2020-09-11 DIAGNOSIS — K589 Irritable bowel syndrome without diarrhea: Secondary | ICD-10-CM | POA: Diagnosis not present

## 2020-09-11 DIAGNOSIS — K529 Noninfective gastroenteritis and colitis, unspecified: Secondary | ICD-10-CM | POA: Diagnosis not present

## 2020-09-14 DIAGNOSIS — A419 Sepsis, unspecified organism: Secondary | ICD-10-CM | POA: Diagnosis not present

## 2020-09-14 DIAGNOSIS — N183 Chronic kidney disease, stage 3 unspecified: Secondary | ICD-10-CM | POA: Diagnosis not present

## 2020-09-14 DIAGNOSIS — F329 Major depressive disorder, single episode, unspecified: Secondary | ICD-10-CM | POA: Diagnosis not present

## 2020-09-14 DIAGNOSIS — K589 Irritable bowel syndrome without diarrhea: Secondary | ICD-10-CM | POA: Diagnosis not present

## 2020-09-14 DIAGNOSIS — K529 Noninfective gastroenteritis and colitis, unspecified: Secondary | ICD-10-CM | POA: Diagnosis not present

## 2020-09-14 DIAGNOSIS — E1169 Type 2 diabetes mellitus with other specified complication: Secondary | ICD-10-CM | POA: Diagnosis not present

## 2020-09-14 DIAGNOSIS — I129 Hypertensive chronic kidney disease with stage 1 through stage 4 chronic kidney disease, or unspecified chronic kidney disease: Secondary | ICD-10-CM | POA: Diagnosis not present

## 2020-09-14 DIAGNOSIS — M329 Systemic lupus erythematosus, unspecified: Secondary | ICD-10-CM | POA: Diagnosis not present

## 2020-09-14 DIAGNOSIS — Z7689 Persons encountering health services in other specified circumstances: Secondary | ICD-10-CM | POA: Diagnosis not present

## 2020-09-14 DIAGNOSIS — E1122 Type 2 diabetes mellitus with diabetic chronic kidney disease: Secondary | ICD-10-CM | POA: Diagnosis not present

## 2020-09-14 DIAGNOSIS — E782 Mixed hyperlipidemia: Secondary | ICD-10-CM | POA: Diagnosis not present

## 2020-09-15 DIAGNOSIS — N1832 Chronic kidney disease, stage 3b: Secondary | ICD-10-CM | POA: Diagnosis not present

## 2020-09-15 DIAGNOSIS — M329 Systemic lupus erythematosus, unspecified: Secondary | ICD-10-CM | POA: Diagnosis not present

## 2020-09-15 DIAGNOSIS — E782 Mixed hyperlipidemia: Secondary | ICD-10-CM | POA: Diagnosis not present

## 2020-09-15 DIAGNOSIS — E1169 Type 2 diabetes mellitus with other specified complication: Secondary | ICD-10-CM | POA: Diagnosis not present

## 2020-09-17 ENCOUNTER — Encounter (HOSPITAL_COMMUNITY): Payer: Self-pay | Admitting: Emergency Medicine

## 2020-09-17 ENCOUNTER — Other Ambulatory Visit: Payer: Self-pay

## 2020-09-17 ENCOUNTER — Emergency Department (HOSPITAL_COMMUNITY): Payer: Medicare Other

## 2020-09-17 ENCOUNTER — Inpatient Hospital Stay (HOSPITAL_COMMUNITY)
Admission: EM | Admit: 2020-09-17 | Discharge: 2020-09-26 | DRG: 683 | Disposition: A | Discharge status: Skilled Nursing Facility | Payer: Medicare Other | Attending: Internal Medicine | Admitting: Internal Medicine

## 2020-09-17 DIAGNOSIS — F32A Depression, unspecified: Secondary | ICD-10-CM | POA: Diagnosis present

## 2020-09-17 DIAGNOSIS — I82432 Acute embolism and thrombosis of left popliteal vein: Secondary | ICD-10-CM | POA: Diagnosis not present

## 2020-09-17 DIAGNOSIS — I152 Hypertension secondary to endocrine disorders: Secondary | ICD-10-CM | POA: Diagnosis present

## 2020-09-17 DIAGNOSIS — A046 Enteritis due to Yersinia enterocolitica: Secondary | ICD-10-CM | POA: Diagnosis not present

## 2020-09-17 DIAGNOSIS — R531 Weakness: Secondary | ICD-10-CM

## 2020-09-17 DIAGNOSIS — E1159 Type 2 diabetes mellitus with other circulatory complications: Secondary | ICD-10-CM | POA: Diagnosis present

## 2020-09-17 DIAGNOSIS — R54 Age-related physical debility: Secondary | ICD-10-CM | POA: Diagnosis present

## 2020-09-17 DIAGNOSIS — I82412 Acute embolism and thrombosis of left femoral vein: Secondary | ICD-10-CM | POA: Diagnosis not present

## 2020-09-17 DIAGNOSIS — E860 Dehydration: Secondary | ICD-10-CM | POA: Diagnosis present

## 2020-09-17 DIAGNOSIS — R300 Dysuria: Secondary | ICD-10-CM | POA: Diagnosis present

## 2020-09-17 DIAGNOSIS — G4733 Obstructive sleep apnea (adult) (pediatric): Secondary | ICD-10-CM | POA: Diagnosis present

## 2020-09-17 DIAGNOSIS — K5900 Constipation, unspecified: Secondary | ICD-10-CM | POA: Diagnosis not present

## 2020-09-17 DIAGNOSIS — R251 Tremor, unspecified: Secondary | ICD-10-CM | POA: Diagnosis present

## 2020-09-17 DIAGNOSIS — Z79899 Other long term (current) drug therapy: Secondary | ICD-10-CM

## 2020-09-17 DIAGNOSIS — Z91048 Other nonmedicinal substance allergy status: Secondary | ICD-10-CM

## 2020-09-17 DIAGNOSIS — Z882 Allergy status to sulfonamides status: Secondary | ICD-10-CM

## 2020-09-17 DIAGNOSIS — E861 Hypovolemia: Secondary | ICD-10-CM | POA: Diagnosis present

## 2020-09-17 DIAGNOSIS — N189 Chronic kidney disease, unspecified: Secondary | ICD-10-CM | POA: Diagnosis not present

## 2020-09-17 DIAGNOSIS — I700 Atherosclerosis of aorta: Secondary | ICD-10-CM | POA: Diagnosis present

## 2020-09-17 DIAGNOSIS — K7689 Other specified diseases of liver: Secondary | ICD-10-CM | POA: Diagnosis present

## 2020-09-17 DIAGNOSIS — R5381 Other malaise: Secondary | ICD-10-CM

## 2020-09-17 DIAGNOSIS — Z6830 Body mass index (BMI) 30.0-30.9, adult: Secondary | ICD-10-CM

## 2020-09-17 DIAGNOSIS — K580 Irritable bowel syndrome with diarrhea: Secondary | ICD-10-CM | POA: Diagnosis present

## 2020-09-17 DIAGNOSIS — D3500 Benign neoplasm of unspecified adrenal gland: Secondary | ICD-10-CM | POA: Diagnosis present

## 2020-09-17 DIAGNOSIS — R52 Pain, unspecified: Secondary | ICD-10-CM | POA: Diagnosis not present

## 2020-09-17 DIAGNOSIS — K219 Gastro-esophageal reflux disease without esophagitis: Secondary | ICD-10-CM | POA: Diagnosis present

## 2020-09-17 DIAGNOSIS — E875 Hyperkalemia: Secondary | ICD-10-CM | POA: Diagnosis present

## 2020-09-17 DIAGNOSIS — N1831 Chronic kidney disease, stage 3a: Secondary | ICD-10-CM | POA: Diagnosis present

## 2020-09-17 DIAGNOSIS — Z20822 Contact with and (suspected) exposure to covid-19: Secondary | ICD-10-CM | POA: Diagnosis present

## 2020-09-17 DIAGNOSIS — T502X5A Adverse effect of carbonic-anhydrase inhibitors, benzothiadiazides and other diuretics, initial encounter: Secondary | ICD-10-CM | POA: Diagnosis present

## 2020-09-17 DIAGNOSIS — E872 Acidosis: Secondary | ICD-10-CM | POA: Diagnosis present

## 2020-09-17 DIAGNOSIS — R21 Rash and other nonspecific skin eruption: Secondary | ICD-10-CM | POA: Diagnosis present

## 2020-09-17 DIAGNOSIS — Z8619 Personal history of other infectious and parasitic diseases: Secondary | ICD-10-CM

## 2020-09-17 DIAGNOSIS — N179 Acute kidney failure, unspecified: Secondary | ICD-10-CM | POA: Diagnosis not present

## 2020-09-17 DIAGNOSIS — I959 Hypotension, unspecified: Secondary | ICD-10-CM | POA: Diagnosis not present

## 2020-09-17 DIAGNOSIS — R000 Tachycardia, unspecified: Secondary | ICD-10-CM | POA: Diagnosis present

## 2020-09-17 DIAGNOSIS — J9811 Atelectasis: Secondary | ICD-10-CM | POA: Diagnosis present

## 2020-09-17 DIAGNOSIS — E278 Other specified disorders of adrenal gland: Secondary | ICD-10-CM | POA: Diagnosis present

## 2020-09-17 DIAGNOSIS — I82442 Acute embolism and thrombosis of left tibial vein: Secondary | ICD-10-CM | POA: Diagnosis not present

## 2020-09-17 DIAGNOSIS — Z66 Do not resuscitate: Secondary | ICD-10-CM | POA: Diagnosis not present

## 2020-09-17 DIAGNOSIS — E1122 Type 2 diabetes mellitus with diabetic chronic kidney disease: Secondary | ICD-10-CM | POA: Diagnosis present

## 2020-09-17 DIAGNOSIS — I129 Hypertensive chronic kidney disease with stage 1 through stage 4 chronic kidney disease, or unspecified chronic kidney disease: Secondary | ICD-10-CM | POA: Diagnosis not present

## 2020-09-17 DIAGNOSIS — E1169 Type 2 diabetes mellitus with other specified complication: Secondary | ICD-10-CM | POA: Diagnosis present

## 2020-09-17 DIAGNOSIS — I82452 Acute embolism and thrombosis of left peroneal vein: Secondary | ICD-10-CM | POA: Diagnosis not present

## 2020-09-17 DIAGNOSIS — R7989 Other specified abnormal findings of blood chemistry: Secondary | ICD-10-CM | POA: Diagnosis present

## 2020-09-17 DIAGNOSIS — R197 Diarrhea, unspecified: Secondary | ICD-10-CM

## 2020-09-17 DIAGNOSIS — R627 Adult failure to thrive: Secondary | ICD-10-CM | POA: Diagnosis present

## 2020-09-17 DIAGNOSIS — E65 Localized adiposity: Secondary | ICD-10-CM | POA: Diagnosis present

## 2020-09-17 DIAGNOSIS — N183 Chronic kidney disease, stage 3 unspecified: Secondary | ICD-10-CM | POA: Diagnosis present

## 2020-09-17 DIAGNOSIS — E785 Hyperlipidemia, unspecified: Secondary | ICD-10-CM | POA: Diagnosis present

## 2020-09-17 LAB — CBC
HCT: 35.7 % — ABNORMAL LOW (ref 36.0–46.0)
Hemoglobin: 11.3 g/dL — ABNORMAL LOW (ref 12.0–15.0)
MCH: 30 pg (ref 26.0–34.0)
MCHC: 31.7 g/dL (ref 30.0–36.0)
MCV: 94.7 fL (ref 80.0–100.0)
Platelets: 199 10*3/uL (ref 150–400)
RBC: 3.77 MIL/uL — ABNORMAL LOW (ref 3.87–5.11)
RDW: 15.3 % (ref 11.5–15.5)
WBC: 7.3 10*3/uL (ref 4.0–10.5)
nRBC: 0 % (ref 0.0–0.2)

## 2020-09-17 LAB — BASIC METABOLIC PANEL
Anion gap: 13 (ref 5–15)
BUN: 19 mg/dL (ref 8–23)
CO2: 27 mmol/L (ref 22–32)
Calcium: 8.8 mg/dL — ABNORMAL LOW (ref 8.9–10.3)
Chloride: 93 mmol/L — ABNORMAL LOW (ref 98–111)
Creatinine, Ser: 2.37 mg/dL — ABNORMAL HIGH (ref 0.44–1.00)
GFR calc Af Amer: 22 mL/min — ABNORMAL LOW (ref >60)
GFR calc non Af Amer: 19 mL/min — ABNORMAL LOW (ref >60)
Glucose, Bld: 122 mg/dL — ABNORMAL HIGH (ref 70–99)
Potassium: 4.1 mmol/L (ref 3.5–5.1)
Sodium: 133 mmol/L — ABNORMAL LOW (ref 135–145)

## 2020-09-17 LAB — LACTIC ACID, PLASMA: Lactic Acid, Venous: 1.1 mmol/L (ref 0.5–1.9)

## 2020-09-17 MED ORDER — ONDANSETRON HCL 4 MG PO TABS: 4.0000 mg | ORAL_TABLET | Freq: Four times a day (QID) | ORAL | Status: DC | PRN

## 2020-09-17 MED ORDER — ROSUVASTATIN CALCIUM 5 MG PO TABS
5.0000 mg | ORAL_TABLET | Freq: Every day | ORAL | Status: DC
  Administered 2020-09-18 – 2020-09-25 (×9): 5 mg via ORAL
  Filled 2020-09-17 (×9): qty 1

## 2020-09-17 MED ORDER — HEPARIN SODIUM (PORCINE) 5000 UNIT/ML IJ SOLN
5000.0000 [IU] | Freq: Three times a day (TID) | INTRAMUSCULAR | Status: DC
  Administered 2020-09-18 – 2020-09-22 (×13): 5000 [IU] via SUBCUTANEOUS
  Filled 2020-09-17 (×13): qty 1

## 2020-09-17 MED ORDER — INSULIN ASPART 100 UNIT/ML ~~LOC~~ SOLN
0.0000 [IU] | Freq: Three times a day (TID) | SUBCUTANEOUS | Status: DC
  Administered 2020-09-18: 2 [IU] via SUBCUTANEOUS
  Administered 2020-09-20 – 2020-09-26 (×7): 1 [IU] via SUBCUTANEOUS

## 2020-09-17 MED ORDER — SODIUM CHLORIDE 0.9 % IV BOLUS
500.0000 mL | Freq: Once | INTRAVENOUS | Status: AC
  Administered 2020-09-17: 500 mL via INTRAVENOUS

## 2020-09-17 MED ORDER — LACTATED RINGERS IV SOLN: INTRAVENOUS | Status: AC

## 2020-09-17 MED ORDER — ACETAMINOPHEN 325 MG PO TABS
650.0000 mg | ORAL_TABLET | Freq: Four times a day (QID) | ORAL | Status: DC | PRN
  Administered 2020-09-20 – 2020-09-21 (×2): 650 mg via ORAL
  Filled 2020-09-17 (×2): qty 2

## 2020-09-17 MED ORDER — ONDANSETRON HCL 4 MG/2ML IJ SOLN
4.0000 mg | Freq: Four times a day (QID) | INTRAMUSCULAR | Status: DC | PRN
  Administered 2020-09-20 – 2020-09-25 (×4): 4 mg via INTRAVENOUS
  Filled 2020-09-17 (×4): qty 2

## 2020-09-17 MED ORDER — ACETAMINOPHEN 650 MG RE SUPP: 650.0000 mg | Freq: Four times a day (QID) | RECTAL | Status: DC | PRN

## 2020-09-17 MED ORDER — SODIUM CHLORIDE 0.9% FLUSH
3.0000 mL | Freq: Once | INTRAVENOUS | Status: AC
  Administered 2020-09-18: 3 mL via INTRAVENOUS

## 2020-09-17 NOTE — H&P (Signed)
History and Physical    Alizzon Dioguardi NWG:[id number] DOB: [birth year] DOA: 09/17/2020  PCP: Marco Collie, MD  Patient coming from: Home via EMS  I have personally briefly reviewed patient's old medical records in [place]  Chief Complaint: Generalized weakness  HPI: Anna Stone is a 79 y.o. female with medical history significant for T2DM, CKD stage III, HTN, HLD who presents to the ED for evaluation of generalized weakness.  Patient is normally ambulatory with use of a walker.  She was recently admitted from 08/31/2020-09/03/2020 for sepsis due to sigmoid colitis and AKI.  She was treated with IV ceftriaxone/Flagyl and IV fluids.  C. difficile testing was negative.  Creatinine on admission was 2.03 and improved to 0.93 on day of discharge. She was advised to discontinue HCTZ.  She was continued on lisinopril, spironolactone, and Lasix. Patient was discharged with home health PT, per DC summary patient refused to go to SNF.    Patient states that since she left the hospital she has not been able to ambulate well even with the use of a walker due to the generalized weakness.  She says she has had diminished urine output with occasional dysuria.  She reports chronic alternating diarrhea and constipation and says she is currently constipated.    She reports excessive fatigue during the day.  She does report snoring at night and prior concern for sleep apnea but has not had formal sleep testing.  She has not had any nausea, vomiting, subjective fevers, chills, diaphoresis, chest pain, dyspnea, cough, abdominal pain.  ED Course:  Initial vitals showed BP 106/43, pulse 107, RR 18, temp 98.0 Fahrenheit, SPO2 96% on room air.  Labs show BUN 19, creatinine 2.37 (previously 0.93 on 09/03/2020), sodium 133, potassium 4.1, bicarb 27, serum glucose 122, WBC 7.3, hemoglobin 11.3, platelets 199,000.  Urinalysis and blood cultures were ordered and pending.  Respiratory  PCR panel is ordered and pending.  CT abdomen/pelvis without contrast showed mild hazy bibasilar atelectasis, stable hepatic cysts, stable 9 mm left adrenal mass, large stool burden, and aortic atherosclerosis.  Patient was ordered to receive 100 cc normal saline.  The hospitalist service was consulted to admit for further evaluation and management.  Review of Systems: All systems reviewed and are negative except as documented in history of present illness above.   Past Medical History:  Diagnosis Date  . Cancer ([place])   . Chronic kidney disease    stage 3  . Depression   . Diabetes mellitus without complication ([place])   . Difficulty breathing   . GERD (gastroesophageal reflux disease)   . Hypertension   . IBS (irritable bowel syndrome)   . Muscle pain   . Palpitation   . PONV (postoperative nausea and vomiting)   . Swelling     Past Surgical History:  Procedure Laterality Date  . ABDOMINAL HYSTERECTOMY    . ANKLE FRACTURE SURGERY Right   . BREAST SURGERY     right  . CHOLECYSTECTOMY    . DECOMPRESSIVE LUMBAR LAMINECTOMY LEVEL 1 Right 10/30/2016   Procedure: RIGHT L5-S1 DECOMPRESSION, EXCISION OF CYST LEVEL 1;  Surgeon: Melina Schools, MD;  Location: [facility];  Service: Orthopedics;  Laterality: Right;  . OVARY SURGERY      Social History:  reports that she has never smoked. She has never used smokeless tobacco. She reports that she does not drink alcohol and does not use drugs.  Allergies  Allergen Reactions  . Sulfa Antibiotics Itching and  Rash  . Adhesive [Tape] Dermatitis    Blisters the skin    No family history on file.   Prior to Admission medications   Medication Sig Start Date End Date Taking? Authorizing Provider  amLODipine (NORVASC) 10 MG tablet Take 10 mg by mouth daily.    [provider]  aspirin 325 MG tablet Take 325 mg by mouth daily. Patient not taking: Reported on 09/01/2020    [provider]  betamethasone valerate (VALISONE)  0.1 % cream Apply 1 application topically 2 (two) times daily. Apply to legs 07/07/20   [provider]  Carboxymethylcellulose Sodium (REFRESH PLUS OP) Place 1 drop into both eyes 2 (two) times daily as needed (dry eyes).     [provider]  cefdinir (OMNICEF) 300 MG capsule Take 1 capsule (300 mg total) by mouth every 12 (twelve) hours. 09/03/20   Oswald Hillock, MD  doxazosin (CARDURA) 1 MG tablet Take 1 mg by mouth at bedtime. 08/24/20   [provider]  EPINEPHrine 0.3 mg/0.3 mL IJ SOAJ injection Inject 0.3 mg into the muscle once as needed for anaphylaxis.     [provider]  fluticasone (FLONASE) 50 MCG/ACT nasal spray Place 2 sprays into both nostrils daily. 08/07/20   [provider]  furosemide (LASIX) 20 MG tablet Take 20 mg by mouth daily.     [provider]  gabapentin (NEURONTIN) 600 MG tablet Take 600 mg by mouth 2 (two) times daily.     [provider]  lisinopril (ZESTRIL) 20 MG tablet Take 1 tablet (20 mg total) by mouth daily. 09/04/20   Oswald Hillock, MD  metoprolol succinate (TOPROL-XL) 100 MG 24 hr tablet Take 100 mg by mouth daily. 04/26/20   [provider]  omeprazole (PRILOSEC) 20 MG capsule Take 20 mg by mouth daily.    [provider]  ondansetron (ZOFRAN) 8 MG tablet Take 8 mg by mouth every 8 (eight) hours as needed for nausea or vomiting.  07/21/20   [provider]  pioglitazone (ACTOS) 30 MG tablet Take 30 mg by mouth daily. 05/19/20   [provider]  rOPINIRole (REQUIP) 2 MG tablet Take 2 mg by mouth at bedtime.    [provider]  rosuvastatin (CRESTOR) 5 MG tablet Take 5 mg by mouth at bedtime.     [provider]  spironolactone (ALDACTONE) 50 MG tablet Take 50 mg by mouth daily. 07/07/20   [provider]    Physical Exam: Vitals:   09/17/20 1503 09/17/20 1756  BP: (!) 106/43 (!) 82/53  Pulse: (!) 107 81  Resp: 18 18  Temp: 98 F (36.7 C)  98.3 F (36.8 C)  TempSrc: Oral Oral  SpO2: 96% 96%  Weight: 77.1 kg   Height: 5\' 2"  (1.575 m)    Constitutional: Obese elderly woman resting supine in bed, NAD, calm, appears tired but comfortable Eyes: PERRL, lids and conjunctivae normal ENMT: Mucous membranes are dry. Posterior pharynx clear of any exudate or lesions.Normal dentition.  Neck: normal, supple, no masses. Respiratory: clear to auscultation bilaterally, no wheezing, no crackles. Normal respiratory effort. No accessory muscle use.  Cardiovascular: Regular rate and rhythm, no murmurs / rubs / gallops. No extremity edema. 2+ pedal pulses. Abdomen: no tenderness, no masses palpated.  Bowel sounds positive.  Musculoskeletal: no clubbing / cyanosis. No joint deformity upper and lower extremities. Good ROM, no contractures. Normal muscle tone.  Skin: Multiple bruises bilateral lower extremities, no rashes, lesions, ulcers.  No induration Neurologic: CN 2-12 grossly intact. Sensation intact, Strength equal upper extremities. Psychiatric: Normal judgment and insight. Alert and oriented x 3. Normal mood.   Labs on Admission: I have personally reviewed following labs and imaging studies  CBC: Recent Labs  Lab 09/17/20 1520  WBC 7.3  HGB 11.3*  HCT 35.7*  MCV 94.7  PLT 798   Basic Metabolic Panel: Recent Labs  Lab 09/17/20 1520  NA 133*  K 4.1  CL 93*  CO2 27  GLUCOSE 122*  BUN 19  CREATININE 2.37*  CALCIUM 8.8*   GFR: Estimated Creatinine Clearance: 17.9 mL/min (A) (by C-G formula based on SCr of 2.37 mg/dL (H)). Liver Function Tests: No results for input(s): AST, ALT, ALKPHOS, BILITOT, PROT, ALBUMIN in the last 168 hours. No results for input(s): LIPASE, AMYLASE in the last 168 hours. No results for input(s): AMMONIA in the last 168 hours. Coagulation Profile: No results for input(s): INR, PROTIME in the last 168 hours. Cardiac Enzymes: No results for input(s): CKTOTAL, CKMB, CKMBINDEX, TROPONINI in the last  168 hours. BNP (last 3 results) No results for input(s): PROBNP in the last 8760 hours. HbA1C: No results for input(s): HGBA1C in the last 72 hours. CBG: No results for input(s): GLUCAP in the last 168 hours. Lipid Profile: No results for input(s): CHOL, HDL, LDLCALC, TRIG, CHOLHDL, LDLDIRECT in the last 72 hours. Thyroid Function Tests: No results for input(s): TSH, T4TOTAL, FREET4, T3FREE, THYROIDAB in the last 72 hours. Anemia Panel: No results for input(s): VITAMINB12, FOLATE, FERRITIN, TIBC, IRON, RETICCTPCT in the last 72 hours. Urine analysis:    Component Value Date/Time   COLORURINE AMBER (A) 08/31/2020 1949   APPEARANCEUR HAZY (A) 08/31/2020 1949   LABSPEC 1.025 08/31/2020 1949   PHURINE 5.0 08/31/2020 1949   GLUCOSEU NEGATIVE 08/31/2020 1949   HGBUR NEGATIVE 08/31/2020 1949   BILIRUBINUR MODERATE (A) 08/31/2020 1949   KETONESUR 5 (A) 08/31/2020 1949   PROTEINUR 30 (A) 08/31/2020 1949   NITRITE NEGATIVE 08/31/2020 1949   LEUKOCYTESUR NEGATIVE 08/31/2020 1949    Radiological Exams on Admission: CT ABDOMEN PELVIS WO CONTRAST  Result Date: 09/17/2020 CLINICAL DATA:  Constipation and abdominal pain. EXAM: CT ABDOMEN AND PELVIS WITHOUT CONTRAST TECHNIQUE: Multidetector CT imaging of the abdomen and pelvis was performed following the standard protocol without IV contrast. COMPARISON:  August 31, 2020 FINDINGS: Lower chest: Mild hazy areas of atelectasis and/or early infiltrate are seen within the bilateral lung bases. Hepatobiliary: A stable, approximately 2.4 cm focus of parenchymal low attenuation is seen within the anterior aspect of the left lobe of the liver. Status post cholecystectomy. No biliary dilatation. Pancreas: Unremarkable. No pancreatic ductal dilatation or surrounding inflammatory changes. Spleen: Normal in size without focal abnormality. Adrenals/Urinary Tract: The right adrenal gland is normal in size. A stable 9 mm low-attenuation left adrenal mass is seen.  Kidneys are normal, without renal calculi, focal lesion, or hydronephrosis. Bladder is unremarkable. Stomach/Bowel: Stomach is within normal limits. The appendix is not clearly identified. No evidence of bowel wall thickening, distention, or inflammatory changes. A large stool burden is seen. Vascular/Lymphatic: There is moderate severity calcification of the abdominal aorta and bilateral common iliac arteries, without evidence of aneurysmal dilatation. No enlarged abdominal or pelvic lymph nodes. Reproductive: Uterus and bilateral adnexa are unremarkable. Other: No abdominal wall hernia or abnormality. No abdominopelvic ascites. Musculoskeletal: Multilevel degenerative changes are noted throughout the lumbar spine IMPRESSION: 1. Mild hazy bibasilar atelectasis and/or early infiltrate. 2. Stable hepatic cyst. 3. Stable 9  mm low-attenuation left adrenal mass which may represent an adrenal adenoma. 4. Large stool burden. 5. Aortic atherosclerosis. Aortic Atherosclerosis (ICD10-I70.0). Electronically Signed   By: Virgina Norfolk M.D.   On: 09/17/2020 20:13    EKG: Independently reviewed. Sinus tachycardia, rate 103.  Rate is slower when compared to prior and PVCs no longer present.  Assessment/Plan Principal Problem:   Acute kidney injury superimposed on CKD ([place]) Active Problems:   Type 2 diabetes mellitus with stage 3 chronic kidney disease ([place])   Hypertension associated with diabetes ([place])   Left adrenal mass ([facility])   Generalized weakness   Hyperlipidemia associated with type 2 diabetes mellitus ([place])  Lusine Corlett Kassity Woodson is a 79 y.o. female with medical history significant for T2DM, CKD stage III, HTN, HLD who is admitted with AKI on CKD stage III.  AKI on CKD stage III: Suspect ATN in setting of medication use with hypovolemia.  CT is negative for evidence of renal calculi, hydronephrosis, or bladder abnormality. -Give 500 cc bolus followed by maintenance IV fluids with LR @ 125 mL/hr  overnight -Obtain urinalysis and urine studies -Bladder scan and in and out cath if needed -Hold home lisinopril, spironolactone, Lasix -Repeat labs in a.m.  Type 2 diabetes: Last A1c 7.2% on 09/01/2020.  Hold home Actos and place on sensitive SSI while in hospital.  Hypotension with history of hypertension: Holding home amlodipine, doxazosin, Lasix, lisinopril, Toprol-XL, spironolactone due to hypotension on admission.  Hyperlipidemia: Continue rosuvastatin.  Left adrenal mass: 9 mm low-attenuation left adrenal mass seen on CT imaging consistent with adrenal adenoma, stable compared to prior CT scanning.  Generalized weakness/deconditioning: Request PT/OT eval.  SNF was recommended on last admission preferred DC to home with home health PT.  Probable OSA: Patient advised to follow with PCP for outpatient sleep study.  Use supplemental oxygen at night if needed.  DVT prophylaxis: Subcutaneous heparin Code Status: DNR, confirmed with patient Family Communication: Discussed with patient, she has discussed with family Disposition Plan: From home, discharge to home versus SNF pending PT/OT eval and patient preference Consults called: None Admission status:  Status is: Observation  The patient remains OBS appropriate and will d/c before 2 midnights.  Dispo: The patient is from: Home              Anticipated d/c is to: Home vs SNF              Anticipated d/c date is: 1 day              Patient currently is not medically stable to d/c.   Zada Finders MD [facility]  If 7PM-7AM, please contact night-coverage [email or web address]  09/17/2020, 11:33 PM

## 2020-09-17 NOTE — ED Triage Notes (Signed)
Pt to triage via [name] from home.  Discharged from hospital 2 weeks ago.  Pt normally ambulatory with walker and has been non-ambulatory since discharge.  Family reports tremors, generalized abd pain, generalized weakness, and dark urine.

## 2020-09-17 NOTE — ED Provider Notes (Signed)
Anna Stone   CSN: [id number] Arrival date & time: 09/17/20  1501     History Chief Complaint  Patient presents with  . Abdominal Pain  . Weakness    Anna Stone Anna Stone is a 79 y.o. female.  Patient with history of chronic kidney disease, diabetes, GERD, hypertension --presents the emergency department by EMS.  Patient was admitted to the hospital from 9/16-9/19/2021 for sepsis related to colitis and acute kidney injury.  Baseline creatinine is 1.3, at discharge 0.9.  Patient was discharged home with home health.  She was transported by EMS today.  EMS reports that family was concerned in regards to patient having tremors, abdominal pain, generalized weakness, trouble with walking, and dark urine.  Patient states that she has been sleeping most of the day.  She has been constipated, last bowel movement was 2 days ago.  She states that she sometimes has diarrhea.  She also reports abdominal pain, none currently.  No chest pain or shortness of breath.  No reported fevers or chills.  Patient reports eating and drinking less.  She states that she just has not felt well.  The course is constant. Aggravating factors: none. Alleviating factors: none.   On last hospitalization, HCTZ was discontinued. She is on furosemide 20mg  daily.           Past Medical History:  Diagnosis Date  . Cancer ([place])   . Chronic kidney disease    stage 3  . Depression   . Diabetes mellitus without complication ([place])   . Difficulty breathing   . GERD (gastroesophageal reflux disease)   . Hypertension   . IBS (irritable bowel syndrome)   . Muscle pain   . Palpitation   . PONV (postoperative nausea and vomiting)   . Swelling     Patient Active Problem List   Diagnosis Date Noted  . Colitis 08/31/2020  . Sepsis ([place]) 08/31/2020  . AKI (acute kidney injury) ([place]) 08/31/2020  . Type 2 diabetes mellitus with stage 3 chronic kidney disease  ([place]) 08/31/2020  . Essential hypertension 08/31/2020  . Leukocytosis 08/31/2020  . Synovial cyst 10/30/2016    Past Surgical History:  Procedure Laterality Date  . ABDOMINAL HYSTERECTOMY    . ANKLE FRACTURE SURGERY Right   . BREAST SURGERY     right  . CHOLECYSTECTOMY    . DECOMPRESSIVE LUMBAR LAMINECTOMY LEVEL 1 Right 10/30/2016   Procedure: RIGHT L5-S1 DECOMPRESSION, EXCISION OF CYST LEVEL 1;  Surgeon: Melina Schools, MD;  Location: [facility];  Service: Orthopedics;  Laterality: Right;  . OVARY SURGERY       OB History   No obstetric history on file.     No family history on file.  Social History   Tobacco Use  . Smoking status: Never Smoker  . Smokeless tobacco: Never Used  Substance Use Topics  . Alcohol use: No  . Drug use: No    Home Medications Prior to Admission medications   Medication Sig Start Date End Date Taking? Authorizing Provider  amLODipine (NORVASC) 10 MG tablet Take 10 mg by mouth daily.    [provider]  aspirin 325 MG tablet Take 325 mg by mouth daily. Patient not taking: Reported on 09/01/2020    [provider]  betamethasone valerate (VALISONE) 0.1 % cream Apply 1 application topically 2 (two) times daily. Apply to legs 07/07/20   [provider]  Carboxymethylcellulose Sodium (REFRESH PLUS OP) Place 1 drop into both  eyes 2 (two) times daily as needed (dry eyes).     [provider]  cefdinir (OMNICEF) 300 MG capsule Take 1 capsule (300 mg total) by mouth every 12 (twelve) hours. 09/03/20   Oswald Hillock, MD  doxazosin (CARDURA) 1 MG tablet Take 1 mg by mouth at bedtime. 08/24/20   [provider]  EPINEPHrine 0.3 mg/0.3 mL IJ SOAJ injection Inject 0.3 mg into the muscle once as needed for anaphylaxis.     [provider]  fluticasone (FLONASE) 50 MCG/ACT nasal spray Place 2 sprays into both nostrils daily. 08/07/20   [provider]  furosemide (LASIX) 20 MG tablet Take 20 mg by mouth  daily.     [provider]  gabapentin (NEURONTIN) 600 MG tablet Take 600 mg by mouth 2 (two) times daily.     [provider]  lisinopril (ZESTRIL) 20 MG tablet Take 1 tablet (20 mg total) by mouth daily. 09/04/20   Oswald Hillock, MD  metoprolol succinate (TOPROL-XL) 100 MG 24 hr tablet Take 100 mg by mouth daily. 04/26/20   [provider]  omeprazole (PRILOSEC) 20 MG capsule Take 20 mg by mouth daily.    [provider]  ondansetron (ZOFRAN) 8 MG tablet Take 8 mg by mouth every 8 (eight) hours as needed for nausea or vomiting.  07/21/20   [provider]  pioglitazone (ACTOS) 30 MG tablet Take 30 mg by mouth daily. 05/19/20   [provider]  rOPINIRole (REQUIP) 2 MG tablet Take 2 mg by mouth at bedtime.    [provider]  rosuvastatin (CRESTOR) 5 MG tablet Take 5 mg by mouth at bedtime.     [provider]  spironolactone (ALDACTONE) 50 MG tablet Take 50 mg by mouth daily. 07/07/20   [provider]    Allergies    Sulfa antibiotics and Adhesive [tape]  Review of Systems   Review of Systems  Constitutional: Positive for fatigue. Negative for chills and fever.  HENT: Negative for rhinorrhea and sore throat.   Eyes: Negative for redness.  Respiratory: Negative for cough.   Cardiovascular: Negative for chest pain.  Gastrointestinal: Positive for abdominal pain, constipation and diarrhea. Negative for nausea and vomiting.  Genitourinary: Negative for dysuria, frequency, hematuria and urgency.  Musculoskeletal: Negative for myalgias.  Skin: Negative for rash.  Neurological: Positive for tremors and weakness (generalized). Negative for headaches.    Physical Exam Updated Vital Signs BP (!) 82/53 (BP Location: Right Arm)   Pulse 81   Temp 98.3 F (36.8 C) (Oral)   Resp 18   Ht 5\' 2"  (1.575 m)   Wt 77.1 kg   SpO2 96%   BMI 31.09 kg/m   Physical Exam Vitals and nursing Stone reviewed.  Constitutional:       General: She is not in acute distress.    Appearance: She is well-developed.  HENT:     Head: Normocephalic and atraumatic.     Right Ear: External ear normal.     Left Ear: External ear normal.     Nose: Nose normal.     Mouth/Throat:     Mouth: Mucous membranes are dry.  Eyes:     Conjunctiva/sclera: Conjunctivae normal.  Cardiovascular:     Rate and Rhythm: Normal rate and regular rhythm.     Heart sounds: No murmur heard.   Pulmonary:     Effort: No respiratory distress.     Breath sounds: No wheezing, rhonchi or rales.  Abdominal:     Palpations: Abdomen is soft.     Tenderness: There is no abdominal tenderness. There is no guarding or rebound.  Musculoskeletal:     Cervical back: Normal range of motion and neck supple.     Right lower leg: No edema.     Left lower leg: No edema.  Skin:    General: Skin is warm and dry.     Findings: No rash.  Neurological:     General: No focal deficit present.     Mental Status: She is alert. Mental status is at baseline.     Motor: No weakness.  Psychiatric:        Mood and Affect: Mood normal.     ED Results / Procedures / Treatments   Labs (all labs ordered are listed, but only abnormal results are displayed) Labs Reviewed  BASIC METABOLIC PANEL - Abnormal; Notable for the following components:      Result Value   Sodium 133 (*)    Chloride 93 (*)    Glucose, Bld 122 (*)    Creatinine, Ser 2.37 (*)    Calcium 8.8 (*)    GFR calc non Af Amer 19 (*)    GFR calc Af Amer 22 (*)    All other components within normal limits  CBC - Abnormal; Notable for the following components:   RBC 3.77 (*)    Hemoglobin 11.3 (*)    HCT 35.7 (*)    All other components within normal limits  CULTURE, BLOOD (ROUTINE X 2)  CULTURE, BLOOD (ROUTINE X 2)  URINE CULTURE  RESPIRATORY PANEL BY RT PCR (FLU A&B, COVID)  URINALYSIS, ROUTINE W REFLEX MICROSCOPIC  LACTIC ACID, PLASMA  LACTIC ACID, PLASMA  CBG MONITORING, ED    EKG EKG  Interpretation  Date/Time:  Sunday September 17 2020 15:05:41 EDT Ventricular Rate:  103 PR Interval:  140 QRS Duration: 60 QT Interval:  302 QTC Calculation: 395 R Axis:   32 Text Interpretation: Sinus tachycardia Otherwise normal ECG No significant change since last tracing Confirmed by Blanchie Dessert [phone]) on 09/17/2020 8:30:33 PM   Radiology CT ABDOMEN PELVIS WO CONTRAST  Result Date: 09/17/2020 CLINICAL DATA:  Constipation and abdominal pain. EXAM: CT ABDOMEN AND PELVIS WITHOUT CONTRAST TECHNIQUE: Multidetector CT imaging of the abdomen and pelvis was performed following the standard protocol without IV contrast. COMPARISON:  August 31, 2020 FINDINGS: Lower chest: Mild hazy areas of atelectasis and/or early infiltrate are seen within the bilateral lung bases. Hepatobiliary: A stable, approximately 2.4 cm focus of parenchymal low attenuation is seen within the anterior aspect of the left lobe of the liver. Status post cholecystectomy. No biliary dilatation. Pancreas: Unremarkable. No pancreatic ductal dilatation or surrounding inflammatory changes. Spleen: Normal in size without focal abnormality. Adrenals/Urinary Tract: The right adrenal gland is normal in size. A stable 9 mm low-attenuation left adrenal mass is seen. Kidneys are normal, without renal calculi, focal lesion, or hydronephrosis. Bladder is unremarkable. Stomach/Bowel: Stomach is within normal limits. The appendix is not clearly identified. No evidence of bowel wall thickening, distention, or inflammatory changes. A large stool burden is seen. Vascular/Lymphatic: There is moderate severity calcification of the abdominal aorta and bilateral common iliac arteries, without evidence of aneurysmal dilatation. No enlarged abdominal or pelvic lymph nodes. Reproductive: Uterus and bilateral adnexa are unremarkable. Other: No abdominal wall hernia or abnormality. No abdominopelvic ascites. Musculoskeletal: Multilevel degenerative changes  are noted throughout the lumbar spine IMPRESSION: 1. Mild hazy bibasilar atelectasis and/or  early infiltrate. 2. Stable hepatic cyst. 3. Stable 9 mm low-attenuation left adrenal mass which may represent an adrenal adenoma. 4. Large stool burden. 5. Aortic atherosclerosis. Aortic Atherosclerosis (ICD10-I70.0). Electronically Signed   By: Virgina Norfolk M.D.   On: 09/17/2020 20:13    Procedures Procedures (including critical care time)  Medications Ordered in ED Medications  sodium chloride flush (NS) 0.9 % injection 3 mL (has no administration in time range)  sodium chloride 0.9 % bolus 500 mL (has no administration in time range)    ED Course  I have reviewed the triage vital signs and the nursing notes.  Pertinent labs & imaging results that were available during my care of the patient were reviewed by me and considered in my medical decision making (see chart for details).  Patient seen and examined. Work-up initiated. BP's are 02-54'Y systolic.  Sepsis work-up ordered.  Will repeat imaging of the abdomen given reported abdominal pain and constipation/diarrhea, although no significant tenderness, rebound or guarding at time of exam.  Patient has a recurrent acute kidney injury with creatinine increased to 2.37.  She will be given IV fluids as she appears dry on exam.  Vital signs reviewed and are as follows: BP (!) 82/53 (BP Location: Right Arm)   Pulse 81   Temp 98.3 F (36.8 C) (Oral)   Resp 18   Ht 5\' 2"  (1.575 m)   Wt 77.1 kg   SpO2 96%   BMI 31.09 kg/m   10:50 PM Pt discussed with Dr. Maryan Rued.   Pt discussed with and will be seen by Dr. Posey Pronto of [facility].     MDM Rules/Calculators/A&P                          Admit with acute on CKD -- likely related to deconditioning, poor oral intake, lasix/aldactone.    Final Clinical Impression(s) / ED Diagnoses Final diagnoses:  Acute renal failure superimposed on chronic kidney disease, unspecified CKD stage, unspecified  acute renal failure type (HCC)  Constipation, unspecified constipation type    Rx / DC Orders ED Discharge Orders    None       Carlisle Cater, PA-C 09/17/20 2253    Blanchie Dessert, MD 09/18/20 2223

## 2020-09-17 NOTE — ED Notes (Addendum)
Pt is a difficult stick unsuccessful on obtaining labs, IV team consult place

## 2020-09-18 DIAGNOSIS — I152 Hypertension secondary to endocrine disorders: Secondary | ICD-10-CM | POA: Diagnosis present

## 2020-09-18 DIAGNOSIS — E1122 Type 2 diabetes mellitus with diabetic chronic kidney disease: Secondary | ICD-10-CM | POA: Diagnosis present

## 2020-09-18 DIAGNOSIS — M2550 Pain in unspecified joint: Secondary | ICD-10-CM | POA: Diagnosis not present

## 2020-09-18 DIAGNOSIS — F329 Major depressive disorder, single episode, unspecified: Secondary | ICD-10-CM | POA: Diagnosis not present

## 2020-09-18 DIAGNOSIS — E860 Dehydration: Secondary | ICD-10-CM | POA: Diagnosis present

## 2020-09-18 DIAGNOSIS — R079 Chest pain, unspecified: Secondary | ICD-10-CM | POA: Diagnosis not present

## 2020-09-18 DIAGNOSIS — J9811 Atelectasis: Secondary | ICD-10-CM | POA: Diagnosis present

## 2020-09-18 DIAGNOSIS — E1169 Type 2 diabetes mellitus with other specified complication: Secondary | ICD-10-CM | POA: Diagnosis present

## 2020-09-18 DIAGNOSIS — R21 Rash and other nonspecific skin eruption: Secondary | ICD-10-CM | POA: Diagnosis present

## 2020-09-18 DIAGNOSIS — N189 Chronic kidney disease, unspecified: Secondary | ICD-10-CM | POA: Diagnosis not present

## 2020-09-18 DIAGNOSIS — Z66 Do not resuscitate: Secondary | ICD-10-CM | POA: Diagnosis present

## 2020-09-18 DIAGNOSIS — W19XXXA Unspecified fall, initial encounter: Secondary | ICD-10-CM | POA: Diagnosis not present

## 2020-09-18 DIAGNOSIS — R197 Diarrhea, unspecified: Secondary | ICD-10-CM | POA: Diagnosis not present

## 2020-09-18 DIAGNOSIS — I82442 Acute embolism and thrombosis of left tibial vein: Secondary | ICD-10-CM | POA: Diagnosis not present

## 2020-09-18 DIAGNOSIS — N1831 Chronic kidney disease, stage 3a: Secondary | ICD-10-CM | POA: Diagnosis present

## 2020-09-18 DIAGNOSIS — E875 Hyperkalemia: Secondary | ICD-10-CM | POA: Diagnosis present

## 2020-09-18 DIAGNOSIS — E785 Hyperlipidemia, unspecified: Secondary | ICD-10-CM | POA: Diagnosis present

## 2020-09-18 DIAGNOSIS — R278 Other lack of coordination: Secondary | ICD-10-CM | POA: Diagnosis not present

## 2020-09-18 DIAGNOSIS — R000 Tachycardia, unspecified: Secondary | ICD-10-CM | POA: Diagnosis present

## 2020-09-18 DIAGNOSIS — I82452 Acute embolism and thrombosis of left peroneal vein: Secondary | ICD-10-CM | POA: Diagnosis not present

## 2020-09-18 DIAGNOSIS — M6281 Muscle weakness (generalized): Secondary | ICD-10-CM | POA: Diagnosis not present

## 2020-09-18 DIAGNOSIS — N179 Acute kidney failure, unspecified: Secondary | ICD-10-CM | POA: Diagnosis present

## 2020-09-18 DIAGNOSIS — A046 Enteritis due to Yersinia enterocolitica: Secondary | ICD-10-CM | POA: Diagnosis not present

## 2020-09-18 DIAGNOSIS — R2689 Other abnormalities of gait and mobility: Secondary | ICD-10-CM | POA: Diagnosis not present

## 2020-09-18 DIAGNOSIS — E118 Type 2 diabetes mellitus with unspecified complications: Secondary | ICD-10-CM | POA: Diagnosis not present

## 2020-09-18 DIAGNOSIS — R627 Adult failure to thrive: Secondary | ICD-10-CM | POA: Diagnosis present

## 2020-09-18 DIAGNOSIS — I5030 Unspecified diastolic (congestive) heart failure: Secondary | ICD-10-CM | POA: Diagnosis not present

## 2020-09-18 DIAGNOSIS — I959 Hypotension, unspecified: Secondary | ICD-10-CM | POA: Diagnosis present

## 2020-09-18 DIAGNOSIS — R609 Edema, unspecified: Secondary | ICD-10-CM | POA: Diagnosis not present

## 2020-09-18 DIAGNOSIS — Z20822 Contact with and (suspected) exposure to covid-19: Secondary | ICD-10-CM | POA: Diagnosis present

## 2020-09-18 DIAGNOSIS — R531 Weakness: Secondary | ICD-10-CM | POA: Diagnosis not present

## 2020-09-18 DIAGNOSIS — Z7401 Bed confinement status: Secondary | ICD-10-CM | POA: Diagnosis not present

## 2020-09-18 DIAGNOSIS — I10 Essential (primary) hypertension: Secondary | ICD-10-CM | POA: Diagnosis not present

## 2020-09-18 DIAGNOSIS — C7490 Malignant neoplasm of unspecified part of unspecified adrenal gland: Secondary | ICD-10-CM | POA: Diagnosis not present

## 2020-09-18 DIAGNOSIS — M7130 Other bursal cyst, unspecified site: Secondary | ICD-10-CM | POA: Diagnosis not present

## 2020-09-18 DIAGNOSIS — N183 Chronic kidney disease, stage 3 unspecified: Secondary | ICD-10-CM | POA: Diagnosis not present

## 2020-09-18 DIAGNOSIS — R7989 Other specified abnormal findings of blood chemistry: Secondary | ICD-10-CM | POA: Diagnosis present

## 2020-09-18 DIAGNOSIS — I82412 Acute embolism and thrombosis of left femoral vein: Secondary | ICD-10-CM | POA: Diagnosis not present

## 2020-09-18 DIAGNOSIS — K219 Gastro-esophageal reflux disease without esophagitis: Secondary | ICD-10-CM | POA: Diagnosis not present

## 2020-09-18 DIAGNOSIS — R54 Age-related physical debility: Secondary | ICD-10-CM | POA: Diagnosis present

## 2020-09-18 DIAGNOSIS — R52 Pain, unspecified: Secondary | ICD-10-CM | POA: Diagnosis not present

## 2020-09-18 DIAGNOSIS — E872 Acidosis: Secondary | ICD-10-CM | POA: Diagnosis present

## 2020-09-18 DIAGNOSIS — I82432 Acute embolism and thrombosis of left popliteal vein: Secondary | ICD-10-CM | POA: Diagnosis not present

## 2020-09-18 LAB — GLUCOSE, CAPILLARY
Glucose-Capillary: 106 mg/dL — ABNORMAL HIGH (ref 70–99)
Glucose-Capillary: 125 mg/dL — ABNORMAL HIGH (ref 70–99)

## 2020-09-18 LAB — BASIC METABOLIC PANEL
Anion gap: 10 (ref 5–15)
BUN: 23 mg/dL (ref 8–23)
CO2: 27 mmol/L (ref 22–32)
Calcium: 8.4 mg/dL — ABNORMAL LOW (ref 8.9–10.3)
Chloride: 97 mmol/L — ABNORMAL LOW (ref 98–111)
Creatinine, Ser: 2.47 mg/dL — ABNORMAL HIGH (ref 0.44–1.00)
GFR calc Af Amer: 21 mL/min — ABNORMAL LOW (ref >60)
GFR calc non Af Amer: 18 mL/min — ABNORMAL LOW (ref >60)
Glucose, Bld: 98 mg/dL (ref 70–99)
Potassium: 4.6 mmol/L (ref 3.5–5.1)
Sodium: 134 mmol/L — ABNORMAL LOW (ref 135–145)

## 2020-09-18 LAB — MAGNESIUM: Magnesium: 1.4 mg/dL — ABNORMAL LOW (ref 1.7–2.4)

## 2020-09-18 LAB — URINALYSIS, ROUTINE W REFLEX MICROSCOPIC
Bilirubin Urine: NEGATIVE
Glucose, UA: NEGATIVE mg/dL
Hgb urine dipstick: NEGATIVE
Ketones, ur: NEGATIVE mg/dL
Leukocytes,Ua: NEGATIVE
Nitrite: NEGATIVE
Protein, ur: NEGATIVE mg/dL
Specific Gravity, Urine: 1.012 (ref 1.005–1.030)
pH: 5 (ref 5.0–8.0)

## 2020-09-18 LAB — CBC
HCT: 31.9 % — ABNORMAL LOW (ref 36.0–46.0)
Hemoglobin: 10.6 g/dL — ABNORMAL LOW (ref 12.0–15.0)
MCH: 31.5 pg (ref 26.0–34.0)
MCHC: 33.2 g/dL (ref 30.0–36.0)
MCV: 94.7 fL (ref 80.0–100.0)
Platelets: 205 10*3/uL (ref 150–400)
RBC: 3.37 MIL/uL — ABNORMAL LOW (ref 3.87–5.11)
RDW: 15.4 % (ref 11.5–15.5)
WBC: 6.6 10*3/uL (ref 4.0–10.5)
nRBC: 0 % (ref 0.0–0.2)

## 2020-09-18 LAB — TSH: TSH: 2.152 u[IU]/mL (ref 0.350–4.500)

## 2020-09-18 LAB — CBG MONITORING, ED
Glucose-Capillary: 70 mg/dL (ref 70–99)
Glucose-Capillary: 94 mg/dL (ref 70–99)
Glucose-Capillary: 166 mg/dL — ABNORMAL HIGH (ref 70–99)

## 2020-09-18 LAB — CREATININE, URINE, RANDOM: Creatinine, Urine: 142.7 mg/dL

## 2020-09-18 LAB — RESPIRATORY PANEL BY RT PCR (FLU A&B, COVID)
Influenza A by PCR: NEGATIVE
Influenza B by PCR: NEGATIVE
SARS Coronavirus 2 by RT PCR: NEGATIVE

## 2020-09-18 LAB — SODIUM, URINE, RANDOM: Sodium, Ur: 65 mmol/L

## 2020-09-18 NOTE — ED Notes (Signed)
Bladder scan 0ml

## 2020-09-18 NOTE — ED Notes (Signed)
I placed interdry in pt's panus area.

## 2020-09-18 NOTE — Progress Notes (Signed)
PROGRESS NOTE    Anna Stone  PYK:[id number] DOB: 1941-08-12 DOA: 09/17/2020 PCP: Marco Collie, MD   Brief Narrative:  Patient is 79 year old female with history of diabetes mellitus, CKD status 3: Hypertension, hyperlipidemia who presents to the emergency room for evaluation of generalized weakness.  She was recently admitted here in September for the management of sepsis due to sigmoid colitis, she was also found to have AKI at the time and was treated with IV antibiotics and fluids.  She was recommended discussing facility on discharge but she denied and went to home.  After going to home, she has been feeling weak, not able to ambulate.  She also complained of some diarrhea at home.  On presentation she was hemodynamically stable.  Lab work showed AKI.  Started on IV fluids.  PT/OT consulted.   Assessment & Plan:   Principal Problem:   Acute kidney injury superimposed on CKD ([place]) Active Problems:   Type 2 diabetes mellitus with stage 3 chronic kidney disease ([place])   Hypertension associated with diabetes ([place])   Left adrenal mass (HCC)   Generalized weakness   Hyperlipidemia associated with type 2 diabetes mellitus ([place])   AKI on CKD stage IIIa: She was ACE/ARB, diuretics at home.  She was also complains of diarrhea. CT imaging done on this admission did not show any evidence of hydronephrosis or obstructive pathology.  Started on IV fluids.  She looks volume depleted.  We will follow-up urine studies including urine sodium and creatinine.  Lisinopril, spironolactone, Lasix on hold.  Continue to monitor BMP.  Generalized weakness: We will request a PT/OT evaluation.  She was recommended to skilled nursing facility on last admission but she declined.  Probably she needs SNF at  discharge.  She ambulates with the help of walker at home but has not been able to ambulate at all recently.  Type 2 diabetes mellitus: Last hemoglobin A1c of 7.2 as per 9/21.  On Actos at home.   Continue sliding scale insulin.  Hypotension: Blood pressure was soft on presentation.  She takes amlodipine, doxazosin, Lasix, lisinopril, Toprol, Xarelto at home these are on hold.  Hyperlipidemia: Crestor  Left adrenal mass: 9 mm low-attenuation left adrenal mass was seen in the CT imaging considered with adrenal adenoma.  It has been stable on comparison to prior CT scan.  Follow-up as an outpatient.  Hypomagnesemia: We will supplement with magnesium.  Suspected OSA: We recommend sleep study as an outpatient.         DVT prophylaxis: Heparin [place] Code Status: DNR Family Communication: None at the bedside Status is: Observation  The patient remains OBS appropriate and will d/c before 2 midnights.  Dispo: The patient is from: Home              Anticipated d/c is to: SNF              Anticipated d/c date is: 3 days              Patient currently is not medically stable to d/c.      Consultants: None  Procedures:None  Antimicrobials:  Anti-infectives (From admission, onward)   None      Subjective:  Patient seen and examined at the bedside this morning.  Hemodynamically stable during my evaluation.  Comfortable.  Denies any abdominal pain, nausea or vomiting.  Complains of generalized weakness  Objective: Vitals:   09/18/20 0545 09/18/20 0630 09/18/20 0830 09/18/20 0844  BP:  (!) 115/55 Marland Kitchen)  141/59   Pulse: 94 93 (!) 103   Resp: 16 17 17    Temp:    97.6 F (36.4 C)  TempSrc:    Oral  SpO2: 94% 95% 99%   Weight:      Height:        Intake/Output Summary (Last 24 hours) at 09/18/2020 0737 Last data filed at 09/17/2020 2355 Gross per 24 hour  Intake 500 ml  Output --  Net 500 ml   Filed Weights   09/17/20 1503  Weight: 77.1 kg    Examination:  General exam: Appears calm and comfortable ,Not in distress, deconditioned, debilitated elderly female  HEENT:PERRL,Oral mucosa moist, Ear/Nose normal on gross exam Respiratory system: Bilateral equal air  entry, normal vesicular breath sounds, no wheezes or crackles  Cardiovascular system: S1 & S2 heard, RRR. No JVD, murmurs, rubs, gallops or clicks. No pedal edema. Gastrointestinal system: Abdomen is nondistended, soft and nontender. No organomegaly or masses felt. Normal bowel sounds heard. Central nervous system: Alert and oriented. No focal neurological deficits. Extremities: No edema, no clubbing ,no cyanosis, distal peripheral pulses palpable. Skin: Scattered bruises, no icterus ,no pallor   Data Reviewed: I have personally reviewed following labs and imaging studies  CBC: Recent Labs  Lab 09/17/20 1520 09/18/20 0427  WBC 7.3 6.6  HGB 11.3* 10.6*  HCT 35.7* 31.9*  MCV 94.7 94.7  PLT 199 106   Basic Metabolic Panel: Recent Labs  Lab 09/17/20 1520 09/17/20 2318 09/18/20 0427  NA 133*  --  134*  K 4.1  --  4.6  CL 93*  --  97*  CO2 27  --  27  GLUCOSE 122*  --  98  BUN 19  --  23  CREATININE 2.37*  --  2.47*  CALCIUM 8.8*  --  8.4*  MG  --  1.4*  --    GFR: Estimated Creatinine Clearance: 17.2 mL/min (A) (by C-G formula based on SCr of 2.47 mg/dL (H)). Liver Function Tests: No results for input(s): AST, ALT, ALKPHOS, BILITOT, PROT, ALBUMIN in the last 168 hours. No results for input(s): LIPASE, AMYLASE in the last 168 hours. No results for input(s): AMMONIA in the last 168 hours. Coagulation Profile: No results for input(s): INR, PROTIME in the last 168 hours. Cardiac Enzymes: No results for input(s): CKTOTAL, CKMB, CKMBINDEX, TROPONINI in the last 168 hours. BNP (last 3 results) No results for input(s): PROBNP in the last 8760 hours. HbA1C: No results for input(s): HGBA1C in the last 72 hours. CBG: Recent Labs  Lab 09/18/20 0852  GLUCAP 70   Lipid Profile: No results for input(s): CHOL, HDL, LDLCALC, TRIG, CHOLHDL, LDLDIRECT in the last 72 hours. Thyroid Function Tests: Recent Labs    09/17/20 2320  TSH 2.152   Anemia Panel: No results for  input(s): VITAMINB12, FOLATE, FERRITIN, TIBC, IRON, RETICCTPCT in the last 72 hours. Sepsis Labs: Recent Labs  Lab 09/17/20 1937  LATICACIDVEN 1.1    Recent Results (from the past 240 hour(s))  Respiratory Panel by RT PCR (Flu A&B, Covid) - Nasopharyngeal Swab     Status: None   Collection Time: 09/17/20 11:31 PM   Specimen: Nasopharyngeal Swab  Result Value Ref Range Status   SARS Coronavirus 2 by RT PCR NEGATIVE NEGATIVE Final    Comment: (NOTE) SARS-CoV-2 target nucleic acids are NOT DETECTED.  The SARS-CoV-2 RNA is generally detectable in upper respiratoy specimens during the acute phase of infection. The lowest concentration of SARS-CoV-2 viral copies this assay can  detect is 131 copies/mL. A negative result does not preclude SARS-Cov-2 infection and should not be used as the sole basis for treatment or other patient management decisions. A negative result may occur with  improper specimen collection/handling, submission of specimen other than nasopharyngeal swab, presence of viral mutation(s) within the areas targeted by this assay, and inadequate number of viral copies (<131 copies/mL). A negative result must be combined with clinical observations, patient history, and epidemiological information. The expected result is Negative.  Fact Sheet for Patients:  PinkCheek.be  Fact Sheet for Healthcare Providers:  GravelBags.it  This test is no t yet approved or cleared by the [place] FDA and  has been authorized for detection and/or diagnosis of SARS-CoV-2 by FDA under an Emergency Use Authorization (EUA). This EUA will remain  in effect (meaning this test can be used) for the duration of the COVID-19 declaration under Section 564(b)(1) of the Act, 21 U.S.C. section 360bbb-3(b)(1), unless the authorization is terminated or revoked sooner.     Influenza A by PCR NEGATIVE NEGATIVE Final   Influenza B by PCR  NEGATIVE NEGATIVE Final    Comment: (NOTE) The Xpert Xpress SARS-CoV-2/FLU/RSV assay is intended as an aid in  the diagnosis of influenza from Nasopharyngeal swab specimens and  should not be used as a sole basis for treatment. Nasal washings and  aspirates are unacceptable for Xpert Xpress SARS-CoV-2/FLU/RSV  testing.  Fact Sheet for Patients: PinkCheek.be  Fact Sheet for Healthcare Providers: GravelBags.it  This test is not yet approved or cleared by the [place] FDA and  has been authorized for detection and/or diagnosis of SARS-CoV-2 by  FDA under an Emergency Use Authorization (EUA). This EUA will remain  in effect (meaning this test can be used) for the duration of the  Covid-19 declaration under Section 564(b)(1) of the Act, 21  U.S.C. section 360bbb-3(b)(1), unless the authorization is  terminated or revoked. Performed at [facility], [place] [address], [place], [place] [address]          Radiology Studies: CT ABDOMEN PELVIS WO CONTRAST  Result Date: 09/17/2020 CLINICAL DATA:  Constipation and abdominal pain. EXAM: CT ABDOMEN AND PELVIS WITHOUT CONTRAST TECHNIQUE: Multidetector CT imaging of the abdomen and pelvis was performed following the standard protocol without IV contrast. COMPARISON:  August 31, 2020 FINDINGS: Lower chest: Mild hazy areas of atelectasis and/or early infiltrate are seen within the bilateral lung bases. Hepatobiliary: A stable, approximately 2.4 cm focus of parenchymal low attenuation is seen within the anterior aspect of the left lobe of the liver. Status post cholecystectomy. No biliary dilatation. Pancreas: Unremarkable. No pancreatic ductal dilatation or surrounding inflammatory changes. Spleen: Normal in size without focal abnormality. Adrenals/Urinary Tract: The right adrenal gland is normal in size. A stable 9 mm low-attenuation left adrenal mass is seen. Kidneys are normal,  without renal calculi, focal lesion, or hydronephrosis. Bladder is unremarkable. Stomach/Bowel: Stomach is within normal limits. The appendix is not clearly identified. No evidence of bowel wall thickening, distention, or inflammatory changes. A large stool burden is seen. Vascular/Lymphatic: There is moderate severity calcification of the abdominal aorta and bilateral common iliac arteries, without evidence of aneurysmal dilatation. No enlarged abdominal or pelvic lymph nodes. Reproductive: Uterus and bilateral adnexa are unremarkable. Other: No abdominal wall hernia or abnormality. No abdominopelvic ascites. Musculoskeletal: Multilevel degenerative changes are noted throughout the lumbar spine IMPRESSION: 1. Mild hazy bibasilar atelectasis and/or early infiltrate. 2. Stable hepatic cyst. 3. Stable 9 mm low-attenuation left adrenal mass  which may represent an adrenal adenoma. 4. Large stool burden. 5. Aortic atherosclerosis. Aortic Atherosclerosis (ICD10-I70.0). Electronically Signed   By: Virgina Norfolk M.D.   On: 09/17/2020 20:13        Scheduled Meds: . heparin  5,000 Units Subcutaneous Q8H  . insulin aspart  0-9 Units Subcutaneous TID WC  . rosuvastatin  5 mg Oral QHS   Continuous Infusions: . lactated ringers 125 mL/hr at 09/18/20 0208     LOS: 0 days    Time spent: 35 mins.More than 50% of that time was spent in counseling and/or coordination of care.      Shelly Coss, MD [facility] P10/03/2020, 9:22 AM

## 2020-09-18 NOTE — ED Notes (Signed)
Pt has two about 1 in lac's in panus area. I applied bacitracin and a dry dressing to lacs. I notified Dr. Tawanna Solo about the lacs and he stated he will put in a wound consult.

## 2020-09-18 NOTE — Evaluation (Signed)
Occupational Therapy Evaluation Patient Details Name: Anna Stone MRN: [id number] DOB: December 30, 1940 Today's Date: 09/18/2020    History of Present Illness Pt is an 79 y/o female admitted secondary to progressive weakness. Found to have AKI. PMH includes DM, HTN, CKD.    Clinical Impression   This 79 yo female admitted with above presents to acute OT with PLOF of needing A for all mobility for past 2-3 months and can do her own basic ADLs at a Mod I level. Currently pt is weak in her arms and legs (legs weaker), is unable to ambulate, and has decreased sitting balance. She will benefit from acute OT with follow up on CIR to get to a Mod I level to return home with husband.    Follow Up Recommendations  CIR;Supervision/Assistance - 24 hour    Equipment Recommendations  Other (comment) (TBD next venue)       Precautions / Restrictions Precautions Precautions: Fall Precaution Comments: hx of falls Restrictions Weight Bearing Restrictions: No      Mobility Bed Mobility Overal bed mobility: Needs Assistance Bed Mobility: Supine to Sit;Sit to Supine     Supine to sit: Min assist;+2 for physical assistance Sit to supine: Min assist;+2 for physical assistance   General bed mobility comments: Min A +2 for trunk assist and LE assist. Pt on very tall stretcher, and given deficits, did not attempt standing as it was unsafe.      Balance Overall balance assessment: Needs assistance Sitting-balance support: No upper extremity supported;Feet supported Sitting balance-Leahy Scale: Poor Sitting balance - Comments: fair to poor balance. Initially requiring mod A, however, able to progress to min guard A for sitting balance.                                    ADL either performed or assessed with clinical judgement   ADL Overall ADL's : Needs assistance/impaired Eating/Feeding: Independent;Sitting Eating/Feeding Details (indicate cue type and reason): min  guard A EOB Grooming: Wash/dry face;Sitting Grooming Details (indicate cue type and reason): min guard A EOB Upper Body Bathing: Minimal assistance;Sitting Upper Body Bathing Details (indicate cue type and reason): EOB Lower Body Bathing: Total assistance;Bed level   Upper Body Dressing : Minimal assistance;Sitting Upper Body Dressing Details (indicate cue type and reason): EOB Lower Body Dressing: Total assistance;Bed level     Toilet Transfer Details (indicate cue type and reason): unsafe to try transfer from raised strecher                 Vision Baseline Vision/History: Wears glasses Wears Glasses: Reading only Patient Visual Report: No change from baseline              Pertinent Vitals/Pain Pain Assessment: No/denies pain     Hand Dominance Right   Extremity/Trunk Assessment Upper Extremity Assessment Upper Extremity Assessment: Generalized weakness (jerking in arms and legs while supine, not when in sitting EOB)   Lower Extremity Assessment Lower Extremity Assessment: RLE deficits/detail;LLE deficits/detail RLE Deficits / Details: Notable weakness. Difficulty performing SLR. Did note jerking type movements at rest in BLE.  LLE Deficits / Details: Notable weakness. Difficulty performing SLR. Did note jerking type movements at rest in BLE.    Cervical / Trunk Assessment Cervical / Trunk Assessment: Normal   Communication Communication Communication: No difficulties   Cognition Arousal/Alertness: Awake/alert Behavior During Therapy: WFL for tasks assessed/performed;Anxious Overall Cognitive Status: Within Functional Limits for  tasks assessed                                        Exercises Exercises: General Lower Extremity General Exercises - Lower Extremity Long Arc Quad: AROM;Both;10 reps;Seated        Home Living Family/patient expects to be discharged to:: Private residence Living Arrangements: Spouse/significant other Available  Help at Discharge: Family;Available 24 hours/day Type of Home: House Home Access: Ramped entrance     Home Layout: Two level;Able to live on main level with bedroom/bathroom     Bathroom Shower/Tub: Walk-in shower;Door   [employer] Toilet: Handicapped height     Home Equipment: Shower seat - built in;Walker - 4 wheels;Bedside commode;Cane - single point;Walker - 2 wheels;Grab bars - tub/shower;Grab bars - toilet;Hand held shower head          Prior Functioning/Environment Level of Independence: Needs assistance  Gait / Transfers Assistance Needed: uses rollator, husband pushes her around on rollator. Has had 3-4 falls.  ADL's / Homemaking Assistance Needed: Modified ADLs.   Comments: Used rollator prn stand turn, husband also pushes her on rollator, can do own ADLs, but not IADLs, does not drive, reports 3-4 falls in last 6 months        OT Problem List: Decreased strength;Impaired balance (sitting and/or standing);Decreased range of motion;Decreased knowledge of use of DME or AE      OT Treatment/Interventions: Self-care/ADL training;DME and/or AE instruction;Patient/family education;Balance training;Therapeutic exercise    OT Goals(Current goals can be found in the care plan section) Acute Rehab OT Goals Patient Stated Goal: to be able to walk  OT Goal Formulation: With patient Time For Goal Achievement: 10/02/20 Potential to Achieve Goals: Good  OT Frequency: Min 2X/week           Co-evaluation PT/OT/SLP Co-Evaluation/Treatment: Yes Reason for Co-Treatment: Complexity of the patient's impairments (multi-system involvement);For patient/therapist safety PT goals addressed during session: Mobility/safety with mobility OT goals addressed during session: Strengthening/ROM;ADL's and self-care      AM-PAC OT "6 Clicks" Daily Activity     Outcome Measure Help from another person eating meals?: A Little Help from another person taking care of personal grooming?: A  Little Help from another person toileting, which includes using toliet, bedpan, or urinal?: Total Help from another person bathing (including washing, rinsing, drying)?: A Lot Help from another person to put on and taking off regular upper body clothing?: A Little Help from another person to put on and taking off regular lower body clothing?: Total 6 Click Score: 13   End of Session    Activity Tolerance: Patient tolerated treatment well Patient left: in bed;with call bell/phone within reach (both strecher side rails up)  OT Visit Diagnosis: Other abnormalities of gait and mobility (R26.89);Muscle weakness (generalized) (M62.81)                Time: 2426-8341 OT Time Calculation (min): 20 min Charges:  OT General Charges $OT Visit: 1 Visit OT Evaluation $OT Eval Moderate Complexity: 1 Mod  [place] Circle, OTR/L Acute [employer] Pager [phone] Office [phone]     Almon Register 09/18/2020, 3:09 PM

## 2020-09-18 NOTE — ED Notes (Signed)
I placed pt on hospital bed.

## 2020-09-18 NOTE — ED Notes (Signed)
Attempted to give report and was told they didn't review the pt yet and the charge nurse is on break and they'll call me back.

## 2020-09-18 NOTE — ED Notes (Signed)
Pt provided with 2 apple juices and 2 packs of graham crackers for bs of 70

## 2020-09-18 NOTE — Progress Notes (Signed)
Rehab Admissions Coordinator Note:  Patient was screened by Cleatrice Burke for appropriateness for an Inpatient Acute Rehab Consult per therapy recs. Noted patient refused SNF last admission and chose d/c home.Patient currently observation status. Does not have the medical neccesity to consider for Cir admit as she is observation.    Cleatrice Burke RN MSN 09/18/2020, 12:54 PM  I can be reached at [phone].

## 2020-09-18 NOTE — Evaluation (Addendum)
Physical Therapy Evaluation Patient Details Name: Anna Stone MRN: [id number] DOB: October 06, 1941 Today's Date: 09/18/2020   History of Present Illness  Pt is an 79 y/o female admitted secondary to progressive weakness. Found to have AKI. PMH includes DM, HTN, CKD.   Clinical Impression  Pt admitted secondary to problem above with deficits below. Pt requiring min A +2 to perform bed mobility tasks. Pt with jerking type motions in BLE at rest. Given deficits and that pt was on very high stretcher, did not attempt OOB mobility secondary to safety concerns. Feel pt would be excellent candidate for CIR. Has good family support and is motivated to regain independence. Will continue to follow acutely.     Follow Up Recommendations CIR    Equipment Recommendations  Wheelchair (measurements PT);Wheelchair cushion (measurements PT)    Recommendations for Other Services       Precautions / Restrictions Precautions Precautions: Fall Restrictions Weight Bearing Restrictions: No      Mobility  Bed Mobility Overal bed mobility: Needs Assistance Bed Mobility: Supine to Sit;Sit to Supine     Supine to sit: Min assist;+2 for physical assistance Sit to supine: Min assist;+2 for physical assistance   General bed mobility comments: Min A +2 for trunk assist and LE assist. Pt on very tall stretcher, and given deficits, did not attempt standing as it was unsafe.   Transfers                    Ambulation/Gait                Stairs            Wheelchair Mobility    Modified Rankin (Stroke Patients Only)       Balance Overall balance assessment: Needs assistance Sitting-balance support: No upper extremity supported;Feet supported Sitting balance-Leahy Scale: Poor Sitting balance - Comments: fair to poor balance. Initially requiring mod A, however, able to progress to min guard A for sitting balance.                                       Pertinent Vitals/Pain Pain Assessment: No/denies pain    Home Living Family/patient expects to be discharged to:: Private residence Living Arrangements: Spouse/significant other Available Help at Discharge: Family;Available 24 hours/day Type of Home: House Home Access: Ramped entrance     Home Layout: Two level;Able to live on main level with bedroom/bathroom Home Equipment: Shower seat - built in;Walker - 4 wheels;Bedside commode;Cane - single point;Walker - 2 wheels;Grab bars - tub/shower;Grab bars - toilet;Hand held shower head      Prior Function Level of Independence: Needs assistance   Gait / Transfers Assistance Needed: uses rollator, husband pushes her around on rollator. Has had 3-4 falls.   ADL's / Homemaking Assistance Needed: Modified ADLs.        Hand Dominance   Dominant Hand: Right    Extremity/Trunk Assessment   Upper Extremity Assessment Upper Extremity Assessment: Defer to OT evaluation    Lower Extremity Assessment Lower Extremity Assessment: RLE deficits/detail;LLE deficits/detail RLE Deficits / Details: Notable weakness. Difficulty performing SLR. Did note jerking type movements at rest in BLE.  LLE Deficits / Details: Notable weakness. Difficulty performing SLR. Did note jerking type movements at rest in BLE.     Cervical / Trunk Assessment Cervical / Trunk Assessment: Normal  Communication   Communication: No difficulties  Cognition  Arousal/Alertness: Awake/alert Behavior During Therapy: WFL for tasks assessed/performed;Anxious Overall Cognitive Status: Within Functional Limits for tasks assessed                                        General Comments      Exercises General Exercises - Lower Extremity Long Arc Quad: AROM;Both;10 reps;Seated   Assessment/Plan    PT Assessment Patient needs continued PT services  PT Problem List Decreased strength;Decreased mobility;Decreased balance;Decreased safety  awareness;Decreased activity tolerance       PT Treatment Interventions Therapeutic activities;Gait training;Therapeutic exercise;Patient/family education;Balance training;Functional mobility training    PT Goals (Current goals can be found in the Care Plan section)  Acute Rehab PT Goals Patient Stated Goal: to be able to walk  PT Goal Formulation: With patient Time For Goal Achievement: 10/02/20 Potential to Achieve Goals: Good    Frequency Min 3X/week   Barriers to discharge        Co-evaluation PT/OT/SLP Co-Evaluation/Treatment: Yes Reason for Co-Treatment: Complexity of the patient's impairments (multi-system involvement);For patient/therapist safety PT goals addressed during session: Mobility/safety with mobility         AM-PAC PT "6 Clicks" Mobility  Outcome Measure Help needed turning from your back to your side while in a flat bed without using bedrails?: A Little Help needed moving from lying on your back to sitting on the side of a flat bed without using bedrails?: A Little Help needed moving to and from a bed to a chair (including a wheelchair)?: A Lot Help needed standing up from a chair using your arms (e.g., wheelchair or bedside chair)?: A Lot Help needed to walk in hospital room?: A Lot Help needed climbing 3-5 steps with a railing? : Total 6 Click Score: 13    End of Session   Activity Tolerance: Patient tolerated treatment well Patient left: in bed;with call bell/phone within reach (on stretcher in ED ) Nurse Communication: Mobility status PT Visit Diagnosis: Muscle weakness (generalized) (M62.81);Difficulty in walking, not elsewhere classified (R26.2)    Time: 3295-1884 PT Time Calculation (min) (ACUTE ONLY): 19 min   Charges:   PT Evaluation $PT Eval Moderate Complexity: 1 Mod          Reuel Derby, PT, DPT  Acute Rehabilitation Services  Pager: [phone] Office: [phone]   Rudean Hitt 09/18/2020, 12:39 PM

## 2020-09-18 NOTE — Consult Note (Signed)
WOC Nurse Consult Note: Patient receiving care in [place]. Reason for Consult: "laceration on abdominal pannus area" Wound type: MASD-ITD Pressure Injury POA: Yes/No/NA Measurement: na Wound bed: moist pink Drainage (amount, consistency, odor) none Periwound: intact Dressing procedure/placement/frequency:  Measure and cut length of InterDry to fit in skin folds that have skin breakdown Tuck InterDry fabric into skin folds in a single layer, allow for 2 inches of overhang from skin edges to allow for wicking to occur May remove to bathe; dry area thoroughly and then tuck into affected areas again Do not apply any creams or ointments when using InterDry DO NOT THROW AWAY FOR 5 DAYS unless soiled with stool DO NOT [facility] product, this will inactivate the silver in the material  New sheet of Interdry should be applied after 5 days of use if patient continues to have skin breakdown   Monitor the wound area(s) for worsening of condition such as: Signs/symptoms of infection,  Increase in size,  Development of or worsening of odor, Development of pain, or increased pain at the affected locations.  Notify the medical team if any of these develop.  Thank you for the consult.  Discussed plan of care with the patient and bedside nurse.  [place] nurse will not follow at this time.  Please re-consult the [place] team if needed.  Val Riles, RN, MSN, CWOCN, CNS-BC, pager [phone]

## 2020-09-19 ENCOUNTER — Inpatient Hospital Stay (HOSPITAL_COMMUNITY): Payer: Medicare Other

## 2020-09-19 ENCOUNTER — Other Ambulatory Visit (HOSPITAL_COMMUNITY): Payer: Medicare Other

## 2020-09-19 DIAGNOSIS — N189 Chronic kidney disease, unspecified: Secondary | ICD-10-CM | POA: Diagnosis not present

## 2020-09-19 DIAGNOSIS — I5030 Unspecified diastolic (congestive) heart failure: Secondary | ICD-10-CM

## 2020-09-19 DIAGNOSIS — N179 Acute kidney failure, unspecified: Secondary | ICD-10-CM | POA: Diagnosis not present

## 2020-09-19 LAB — BASIC METABOLIC PANEL
Anion gap: 12 (ref 5–15)
BUN: 21 mg/dL (ref 8–23)
CO2: 27 mmol/L (ref 22–32)
Calcium: 9 mg/dL (ref 8.9–10.3)
Chloride: 97 mmol/L — ABNORMAL LOW (ref 98–111)
Creatinine, Ser: 1.56 mg/dL — ABNORMAL HIGH (ref 0.44–1.00)
GFR calc Af Amer: 36 mL/min — ABNORMAL LOW (ref >60)
GFR calc non Af Amer: 31 mL/min — ABNORMAL LOW (ref >60)
Glucose, Bld: 125 mg/dL — ABNORMAL HIGH (ref 70–99)
Potassium: 5.5 mmol/L — ABNORMAL HIGH (ref 3.5–5.1)
Sodium: 136 mmol/L (ref 135–145)

## 2020-09-19 LAB — GLUCOSE, CAPILLARY
Glucose-Capillary: 135 mg/dL — ABNORMAL HIGH (ref 70–99)
Glucose-Capillary: 106 mg/dL — ABNORMAL HIGH (ref 70–99)
Glucose-Capillary: 92 mg/dL (ref 70–99)
Glucose-Capillary: 76 mg/dL (ref 70–99)
Glucose-Capillary: 96 mg/dL (ref 70–99)

## 2020-09-19 LAB — URINE CULTURE: Culture: <10000 — AB

## 2020-09-19 LAB — ECHOCARDIOGRAM COMPLETE
Area-P 1/2: 4.36 cm2
Calc EF: 73.6 %
Height: 62 in
S' Lateral: 2.7 cm
Single Plane A2C EF: 75.7 %
Single Plane A4C EF: 70.8 %
Weight: 2641.99 oz

## 2020-09-19 MED ORDER — PERFLUTREN LIPID MICROSPHERE
1.0000 mL | INTRAVENOUS | Status: AC | PRN
  Administered 2020-09-19: 2 mL via INTRAVENOUS
  Filled 2020-09-19: qty 10

## 2020-09-19 MED ORDER — SODIUM ZIRCONIUM CYCLOSILICATE 10 G PO PACK
10.0000 g | PACK | Freq: Once | ORAL | Status: AC
  Administered 2020-09-19: 10 g via ORAL
  Filled 2020-09-19: qty 1

## 2020-09-19 MED ORDER — AMLODIPINE BESYLATE 10 MG PO TABS
10.0000 mg | ORAL_TABLET | Freq: Every day | ORAL | Status: DC
  Administered 2020-09-19 – 2020-09-20 (×2): 10 mg via ORAL
  Filled 2020-09-19 (×2): qty 1

## 2020-09-19 MED ORDER — DOXAZOSIN MESYLATE 2 MG PO TABS
1.0000 mg | ORAL_TABLET | Freq: Every day | ORAL | Status: DC
  Administered 2020-09-19 – 2020-09-25 (×7): 1 mg via ORAL
  Filled 2020-09-19 (×7): qty 1

## 2020-09-19 MED ORDER — SODIUM CHLORIDE 0.9 % IV SOLN: INTRAVENOUS | Status: DC

## 2020-09-19 NOTE — NC FL2 (Signed)
[place] LEVEL OF CARE SCREENING TOOL     IDENTIFICATION  Patient Name: Anna Stone Birthdate: 10-May-1941 Sex: female Admission Date (Current Location): 09/17/2020  [facility] and [place] Number:  Anna Stone (Patient lives in [place])   Facility and Address:  [facility], [place] [address] [place], [place] [address]      Provider Number: [id number]  Attending Physician Name and Address:  Shelly Coss, MD  Relative Name and Phone Number:  Anna Stone - husband; [phone]    Current Level of Care: Hospital Recommended Level of Care: [place] Prior Approval Number:    Date Approved/Denied:   PASRR Number: [id number] (Eff. 09/19/20)  Discharge Plan: SNF    Current Diagnoses: Patient Active Problem List   Diagnosis Date Noted  . Acute kidney injury superimposed on CKD ([place]) 09/17/2020  . 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 Generalized weakness 09/17/2020  . Hyperlipidemia associated with type 2 diabetes mellitus ([place]) 09/17/2020  . Type 2 diabetes mellitus with stage 3 chronic kidney disease ([place]) 08/31/2020  . Hypertension associated with diabetes ([place]) 08/31/2020  . Synovial cyst 10/30/2016    Orientation RESPIRATION BLADDER Height & Weight     Self, Time, Situation, Place  Normal Incontinent, External catheter (Catheter placed 09/18/20) Weight: 165 lb 2 oz (74.9 kg) Height:  5\' 2"  (157.5 cm)  BEHAVIORAL SYMPTOMS/MOOD NEUROLOGICAL BOWEL NUTRITION STATUS      Continent Diet (Heart healthy - Carb modified)  AMBULATORY STATUS COMMUNICATION OF NEEDS Skin   Total Care (Could not amublate with PT during evaluation on 10/4) Verbally Other (Comment) (Abdominal pannus fissure, cleansed every shift with soap & water; Ecchymosis bilateral arm & leg and abdomen)                       Personal Care Assistance Level of Assistance  Bathing, Feeding, Dressing Bathing Assistance: Maximum assistance  (Upper body min assist) Feeding assistance: Independent Dressing Assistance: Maximum assistance (Upper body min assist)     Functional Limitations Info  Sight, Hearing, Speech Sight Info: Impaired (Wears reading glasses) Hearing Info: Adequate Speech Info: Adequate    SPECIAL CARE FACTORS FREQUENCY  PT (By licensed PT), OT (By licensed OT)     PT Frequency: Evaluated 10/4. PT at SNF eval and treat OT Frequency: Evaluated 10/4. OT at SNF eval and treat            Contractures Contractures Info: Not present    Additional Factors Info  Code Status, Allergies, Insulin Sliding Scale, Isolation Precautions Code Status Info: DNR Allergies Info: Sulfur antibiotics, Adhesive tape   Insulin Sliding Scale Info: 0-9 Units, 3 times per day with meals Isolation Precautions Info: Enteric precautions     Current Medications (09/19/2020):  This is the current hospital active medication list Current Facility-Administered Medications  Medication Dose Route Frequency Provider Last Rate Last Admin  . 0.9 %  sodium chloride infusion   Intravenous Continuous Shelly Coss, MD 75 mL/hr at 09/19/20 1215 New Bag at 09/19/20 1215  . acetaminophen (TYLENOL) tablet 650 mg  650 mg Oral Q6H PRN Lenore Cordia, MD       Or  . acetaminophen (TYLENOL) suppository 650 mg  650 mg Rectal Q6H PRN Zada Finders R, MD      . amLODipine (NORVASC) tablet 10 mg  10 mg Oral Daily Shelly Coss, MD   10 mg at 09/19/20 1610  . doxazosin (CARDURA) tablet 1 mg  1  mg Oral QHS Shelly Coss, MD      . heparin injection 5,000 Units  5,000 Units Subcutaneous Q8H Lenore Cordia, MD   5,000 Units at 09/19/20 1610  . insulin aspart (novoLOG) injection 0-9 Units  0-9 Units Subcutaneous TID WC Lenore Cordia, MD   2 Units at 09/18/20 1301  . ondansetron (ZOFRAN) tablet 4 mg  4 mg Oral Q6H PRN Lenore Cordia, MD       Or  . ondansetron (ZOFRAN) injection 4 mg  4 mg Intravenous Q6H PRN Zada Finders R, MD      .  rosuvastatin (CRESTOR) tablet 5 mg  5 mg Oral QHS Lenore Cordia, MD   5 mg at 09/18/20 2300     Discharge Medications: Please see discharge summary for a list of discharge medications.  Relevant Imaging Results:  Relevant Lab Results:   Additional Information [id number]. Moderna COVID vaccination dates: 3/1 and 03/15/20  Anna Feil, LCSW

## 2020-09-19 NOTE — Progress Notes (Signed)
Inpatient Rehabilitation Admissions Coordinator  I am not pursuing Cir admit for lacks medical neccesity for an inpt /CIR admit. I have notified MD, acute team and TOC.  Danne Baxter, RN, MSN Rehab Admissions Coordinator [phone] 09/19/2020 12:48 PM

## 2020-09-19 NOTE — Progress Notes (Signed)
  Echocardiogram 2D Echocardiogram has been performed.  Geoffery Lyons [place] 09/19/2020, 11:44 AM

## 2020-09-19 NOTE — TOC Initial Note (Signed)
Transition of Care [facility]) - Initial/Assessment Note    Patient Details  Name: Anna Stone MRN: [id number] Date of Birth: September 06, 1941  Transition of Care [facility]) CM/SW Contact:    Anna Feil, LCSW Phone Number: 09/19/2020, 2:47 PM  Clinical Narrative:  Before visit to room CSW was informed by Anna Stone with CIR that patient did not meet medical necessity for CIR. CSW visited room to talk with patient regarding CIR not accepting her and SNF placement for ST rehab. Anna Stone was sitting up in bed and was alert, oriented, pleasant and willing to talk with CSW. Discussed discharge disposition and patient advised re: being declined by inpatient rehab and why. CSW explained role and assistance with d/c to a skilled nursing facility for ST rehab. Anna Stone expressed understanding and responded when asked, that she has never been to a nursing facility for rehab.    The SNF search process was explained and Anna Stone provided with a SNF list for [facility], and when asked, she declined her information being sent outside of [facility]. Patient provided CSW with preferences: [facility] and [facility].   Anna Stone reported that she has 2 sons: Anna Stone and Anna Stone. Per patient Anna Stone lives in a house on their property behind their home and Anna Stone lives 5 miles away, and she has 6 grandchildren.   *Per patient, her husband has had the Pfizer vaccines and plans to get the booster shot.  Anna Stone advised CSW that she has had the Moderna vaccines and dates shown in Epic are: 3/1 and 03/15/20.             Expected Discharge Plan: Skilled Nursing Facility Barriers to Discharge: SNF Pending bed offer (Initiating facility search today)   Patient Goals and CMS Choice Patient states their goals for this hospitalization and ongoing recovery are:: Patient agreeable to [place] rehab, then home CMS Medicare.gov Compare Post Acute Care list  provided to:: Patient Choice offered to / list presented to : Patient  Expected Discharge Plan and Services Expected Discharge Plan: [place] In-house Referral: Clinical Social Work   Post Acute Care Choice: [place] Living arrangements for the past 2 months: [place]                                      Prior Living Arrangements/Services Living arrangements for the past 2 months: Single Family Home Lives with:: Spouse Patient language and need for interpreter reviewed:: No     Patient agreeable to SNF for ST rehab, then home  Need for Family Participation in Patient Care: Yes (Comment) Care giver support system in place?: No (comment)   Criminal Activity/Legal Involvement Pertinent to Current Situation/Hospitalization: No - Comment as needed  Activities of Daily Living      Permission Sought/Granted Permission sought to share information with : Family Supports Permission granted to share information with : Yes, Release of Information Signed  Share Information with NAME: Anna Stone     Permission granted to share info w Relationship: Husband  Permission granted to share info w Contact Information: [phone]  Emotional Assessment Appearance:: Appears stated age Attitude/Demeanor/Rapport: Engaged Affect (typically observed): Pleasant Orientation: : Oriented to Self, Oriented to Place, Oriented to Situation Alcohol / Substance Use: Tobacco Use, Alcohol Use, Illicit Drugs (Per H&P, patient reported thaat she has never smoked and does not drink  or use illicit drugs) Psych Involvement: No (comment)  Admission diagnosis:  Constipation, unspecified constipation type [K59.00] Acute kidney injury superimposed on CKD ([place]) [N17.9, N18.9] Acute renal failure superimposed on chronic kidney disease, unspecified CKD stage, unspecified acute renal failure type ([place]) [N17.9, N18.9] Patient Active Problem List   Diagnosis Date Noted   . Acute kidney injury superimposed on CKD ([place]) 09/17/2020  . Left adrenal mass ([place]) 09/17/2020  . Generalized weakness 09/17/2020  . Hyperlipidemia associated with type 2 diabetes mellitus ([place]) 09/17/2020  . Type 2 diabetes mellitus with stage 3 chronic kidney disease ([place]) 08/31/2020  . Hypertension associated with diabetes ([place]) 08/31/2020  . Synovial cyst 10/30/2016   PCP:  Marco Collie, MD Pharmacy:   [facility] [address] [place] [address] [place] [address] Phone: [phone] Fax: [phone]     Social Determinants of Health (SDOH) Interventions    Readmission Risk Interventions No flowsheet data found.

## 2020-09-19 NOTE — Progress Notes (Signed)
PROGRESS NOTE    Anna Stone  RSW:[id number] DOB: Jul 19, 1941 DOA: 09/17/2020 PCP: Marco Collie, MD   Brief Narrative:  Patient is 79 year old female with history of diabetes mellitus, CKD status 3a,Hypertension, hyperlipidemia who presents to the emergency room for evaluation of generalized weakness.  She was recently admitted here in September for the management of sepsis due to sigmoid colitis, she was also found to have AKI at the time and was treated with IV antibiotics and fluids.  She was recommended skilled facility on discharge but she denied and went to home.  After going to home, she has been feeling weak, not able to ambulate.  She also complained of some diarrhea at home and had decreased oral intake.  On presentation she was hemodynamically stable.  Lab work showed AKI.  Started on IV fluids with improvement.  PT/OT consulted and recommended SNF on DC.  TOC consulted.  Assessment & Plan:   Principal Problem:   Acute kidney injury superimposed on CKD ([place]) Active Problems:   Type 2 diabetes mellitus with stage 3 chronic kidney disease ([place])   Hypertension associated with diabetes ([place])   Left adrenal mass ([facility])   Generalized weakness   Hyperlipidemia associated with type 2 diabetes mellitus ([place])   AKI on CKD stage IIIa: She was ACE/ARB, diuretics at home.  Her baseline creatinine ranges  from 1.2-1.4.  She was also complaining  of diarrhea. CT imaging done on this admission did not show any evidence of hydronephrosis or obstructive pathology.  Started on IV fluids with improvement .  She looked volume depleted on arrival.    Lisinopril, spironolactone, Lasix on hold.  Continue to monitor BMP.  Much improved today.  We will continue gentle IV fluids till tomorrow, please reassess in the morning if she needs further IV fluids  Generalized weakness: PT/OT evaluation done and she has been recommended skilled nursing facility/CIR.  She was recommended to skilled  nursing facility on last admission but she declined. .  She ambulates with the help of walker at home but has not been able to ambulate at all recently.  TOC consulted  Diarrhea: Complains of intermittent diarrhea.  Her diarrhea started when she was admitted last time for colitis.  Denies any abdominal pain.  On her last admission, GI pathogen panel and C. difficile were negative.  Will recheck GI pathogen panel, if is negative then we can start on Imodium.  Hyperkalemia: We will give a dose of Lokelma.  Check BMP tomorrow  Type 2 diabetes mellitus: Last hemoglobin A1c of 7.2 as per 9/21.  There is no antidiabetic medication on file at home.  Continue sliding scale insulin while in-house, will consider Metformin on discharge if her kidney function improves.Or can consider Tradjenta also  Hypotension: Blood pressure was soft on presentation.  She takes amlodipine, doxazosin, Lasix, lisinopril at home these are on hold.  Blood pressure has improved and currently she is mildly hypertensive.  Will resume amlodipine.  Her echocardiogram shows normal left ventricular function, grade 1 diastolic dysfunction.  Will discontinue lisinopril, spironolactone permanently.  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  It has been stable on comparison to prior CT scan.  Follow-up as an outpatient.  Hypomagnesemia: Supplemented with magnesium.  Suspected OSA: We recommend sleep study as an outpatient.  Skin breakdown on the abdominal pannus: Wound care was consulted.  Continue supportive care  DVT prophylaxis: Heparin [place] Code Status: DNR Family Communication: Husband at the bedside Status is: Inpatient Dispo: The patient is from: Home              Anticipated d/c is to: SNF              Anticipated d/c date is: 1-2 days              Patient currently is medically stable for  discharge.   Consultants: None  Procedures:None  Antimicrobials:  Anti-infectives (From admission, onward)   None      Subjective:  Patient seen and examined at the bedside this morning.  Hemodynamically stable.  Looks more comfortable today.  Lying on the bed.  Husband at the bedside.  Complains of 3 episodes of diarrhea today.  Denies any abdomen pain.  Objective: Vitals:   09/18/20 1902 09/18/20 2300 09/19/20 0500 09/19/20 0637  BP: (!) 130/56 119/60  (!) 142/68  Pulse: (!) 101 99  (!) 110  Resp: 18 18  17   Temp: 97.7 F (36.5 C) 97.8 F (36.6 C)  97.8 F (36.6 C)  TempSrc: Oral Oral  Oral  SpO2: 95% 97%  98%  Weight:   74.9 kg   Height:        Intake/Output Summary (Last 24 hours) at 09/19/2020 0802 Last data filed at 09/19/2020 0601 Gross per 24 hour  Intake 1400 ml  Output --  Net 1400 ml   Filed Weights   09/17/20 1503 09/18/20 1443 09/19/20 0500  Weight: 77.1 kg 74.7 kg 74.9 kg    Examination:   General exam: Appears calm and comfortable ,Not in distress, deconditioned, debilitated pleasant elderly female HEENT:PERRL,Oral mucosa moist, Ear/Nose normal on gross exam Respiratory system: Bilateral equal air entry, normal vesicular breath sounds, no wheezes or crackles  Cardiovascular system: S1 & S2 heard, RRR. No JVD, murmurs, rubs, gallops or clicks. Gastrointestinal system: Abdomen is nondistended, soft and nontender. No organomegaly or masses felt. Normal bowel sounds heard.  Superficial skin breakdown on the abdominal pannus just above the suprapubic region. Central nervous system: Alert and oriented. No focal neurological deficits. Extremities: No edema, no clubbing ,no cyanosis Skin: No rashes, lesions or ulcers,no icterus ,no pallor   Data Reviewed: I have personally reviewed following labs and imaging studies  CBC: Recent Labs  Lab 09/17/20 1520 09/18/20 0427  WBC 7.3 6.6  HGB 11.3* 10.6*  HCT 35.7* 31.9*  MCV 94.7 94.7  PLT 199 295    Basic Metabolic Panel: Recent Labs  Lab 09/17/20 1520 09/17/20 2318 09/18/20 0427 09/19/20 0438  NA 133*  --  134* 136  K 4.1  --  4.6 5.5*  CL 93*  --  97* 97*  CO2 27  --  27 27  GLUCOSE 122*  --  98 125*  BUN 19  --  23 21  CREATININE 2.37*  --  2.47* 1.56*  CALCIUM 8.8*  --  8.4* 9.0  MG  --  1.4*  --   --    GFR: Estimated Creatinine Clearance: 26.8 mL/min (A) (by C-G formula based on SCr of 1.56 mg/dL (H)). Liver Function Tests: No results for input(s): AST, ALT, ALKPHOS, BILITOT, PROT, ALBUMIN in the last 168 hours. No results for input(s): LIPASE, AMYLASE in the last 168 hours. No results for input(s): AMMONIA in the last 168 hours. Coagulation Profile: No results for input(s): INR, PROTIME in the last 168 hours. Cardiac Enzymes: No results for input(s): CKTOTAL, CKMB, CKMBINDEX,  TROPONINI in the last 168 hours. BNP (last 3 results) No results for input(s): PROBNP in the last 8760 hours. HbA1C: No results for input(s): HGBA1C in the last 72 hours. CBG: Recent Labs  Lab 09/18/20 1153 09/18/20 1616 09/18/20 2313 09/19/20 0653 09/19/20 0753  GLUCAP 166* 106* 125* 135* 106*   Lipid Profile: No results for input(s): CHOL, HDL, LDLCALC, TRIG, CHOLHDL, LDLDIRECT in the last 72 hours. Thyroid Function Tests: Recent Labs    09/17/20 2320  TSH 2.152   Anemia Panel: No results for input(s): VITAMINB12, FOLATE, FERRITIN, TIBC, IRON, RETICCTPCT in the last 72 hours. Sepsis Labs: Recent Labs  Lab 09/17/20 1937  LATICACIDVEN 1.1    Recent Results (from the past 240 hour(s))  Blood culture (routine x 2)     Status: None (Preliminary result)   Collection Time: 09/17/20 11:20 PM   Specimen: BLOOD  Result Value Ref Range Status   Specimen Description BLOOD RIGHT ANTECUBITAL  Final   Special Requests   Final    BOTTLES DRAWN AEROBIC AND ANAEROBIC Blood Culture results may not be optimal due to an inadequate volume of blood received in culture bottles    Culture   Final    NO GROWTH < 12 HOURS Performed at [facility] [address] [place], [place] [address]    Report Status PENDING  Incomplete  Blood culture (routine x 2)     Status: None (Preliminary result)   Collection Time: 09/17/20 11:20 PM   Specimen: BLOOD RIGHT HAND  Result Value Ref Range Status   Specimen Description BLOOD RIGHT HAND  Final   Special Requests   Final    BOTTLES DRAWN AEROBIC AND ANAEROBIC Blood Culture adequate volume   Culture   Final    NO GROWTH < 12 HOURS Performed at [facility], [place] [address] [place], [place] [address]    Report Status PENDING  Incomplete  Respiratory Panel by RT PCR (Flu A&B, Covid) - Nasopharyngeal Swab     Status: None   Collection Time: 09/17/20 11:31 PM   Specimen: Nasopharyngeal Swab  Result Value Ref Range Status   SARS Coronavirus 2 by RT PCR NEGATIVE NEGATIVE Final    Comment: (NOTE) SARS-CoV-2 target nucleic acids are NOT DETECTED.  The SARS-CoV-2 RNA is generally detectable in upper respiratoy specimens during the acute phase of infection. The lowest concentration of SARS-CoV-2 viral copies this assay can detect is 131 copies/mL. A negative result does not preclude SARS-Cov-2 infection and should not be used as the sole basis for treatment or other patient management decisions. A negative result may occur with  improper specimen collection/handling, submission of specimen other than nasopharyngeal swab, presence of viral mutation(s) within the areas targeted by this assay, and inadequate number of viral copies (<131 copies/mL). A negative result must be combined with clinical observations, patient history, and epidemiological information. The expected result is Negative.  Fact Sheet for Patients:  PinkCheek.be  Fact Sheet for Healthcare Providers:  GravelBags.it  This test is no t yet approved or cleared by the [place] FDA and   has been authorized for detection and/or diagnosis of SARS-CoV-2 by FDA under an Emergency Use Authorization (EUA). This EUA will remain  in effect (meaning this test can be used) for the duration of the COVID-19 declaration under Section 564(b)(1) of the Act, 21 U.S.C. section 360bbb-3(b)(1), unless the authorization is terminated or revoked sooner.     Influenza A by PCR NEGATIVE NEGATIVE Final  Influenza B by PCR NEGATIVE NEGATIVE Final    Comment: (NOTE) The Xpert Xpress SARS-CoV-2/FLU/RSV assay is intended as an aid in  the diagnosis of influenza from Nasopharyngeal swab specimens and  should not be used as a sole basis for treatment. Nasal washings and  aspirates are unacceptable for Xpert Xpress SARS-CoV-2/FLU/RSV  testing.  Fact Sheet for Patients: PinkCheek.be  Fact Sheet for Healthcare Providers: GravelBags.it  This test is not yet approved or cleared by the [place] FDA and  has been authorized for detection and/or diagnosis of SARS-CoV-2 by  FDA under an Emergency Use Authorization (EUA). This EUA will remain  in effect (meaning this test can be used) for the duration of the  Covid-19 declaration under Section 564(b)(1) of the Act, 21  U.S.C. section 360bbb-3(b)(1), unless the authorization is  terminated or revoked. Performed at [facility], [place] [address] [place], [place] [address]          Radiology Studies: CT ABDOMEN PELVIS WO CONTRAST  Result Date: 09/17/2020 CLINICAL DATA:  Constipation and abdominal pain. EXAM: CT ABDOMEN AND PELVIS WITHOUT CONTRAST TECHNIQUE: Multidetector CT imaging of the abdomen and pelvis was performed following the standard protocol without IV contrast. COMPARISON:  August 31, 2020 FINDINGS: Lower chest: Mild hazy areas of atelectasis and/or early infiltrate are seen within the bilateral lung bases. Hepatobiliary: A stable, approximately 2.4 cm focus of  parenchymal low attenuation is seen within the anterior aspect of the left lobe of the liver. Status post cholecystectomy. No biliary dilatation. Pancreas: Unremarkable. No pancreatic ductal dilatation or surrounding inflammatory changes. Spleen: Normal in size without focal abnormality. Adrenals/Urinary Tract: The right adrenal gland is normal in size. A stable 9 mm low-attenuation left adrenal mass is seen. Kidneys are normal, without renal calculi, focal lesion, or hydronephrosis. Bladder is unremarkable. Stomach/Bowel: Stomach is within normal limits. The appendix is not clearly identified. No evidence of bowel wall thickening, distention, or inflammatory changes. A large stool burden is seen. Vascular/Lymphatic: There is moderate severity calcification of the abdominal aorta and bilateral common iliac arteries, without evidence of aneurysmal dilatation. No enlarged abdominal or pelvic lymph nodes. Reproductive: Uterus and bilateral adnexa are unremarkable. Other: No abdominal wall hernia or abnormality. No abdominopelvic ascites. Musculoskeletal: Multilevel degenerative changes are noted throughout the lumbar spine IMPRESSION: 1. Mild hazy bibasilar atelectasis and/or early infiltrate. 2. Stable hepatic cyst. 3. Stable 9 mm low-attenuation left adrenal mass which may represent an adrenal adenoma. 4. Large stool burden. 5. Aortic atherosclerosis. Aortic Atherosclerosis (ICD10-I70.0). Electronically Signed   By: Virgina Norfolk M.D.   On: 09/17/2020 20:13        Scheduled Meds: . heparin  5,000 Units Subcutaneous Q8H  . insulin aspart  0-9 Units Subcutaneous TID WC  . rosuvastatin  5 mg Oral QHS  . sodium zirconium cyclosilicate  10 g Oral Once   Continuous Infusions: . sodium chloride       LOS: 1 day    Time spent: 35 mins.More than 50% of that time was spent in counseling and/or coordination of care.      Shelly Coss, MD [facility] P10/04/2020, 8:02 AM

## 2020-09-20 DIAGNOSIS — N179 Acute kidney failure, unspecified: Secondary | ICD-10-CM | POA: Diagnosis not present

## 2020-09-20 DIAGNOSIS — N189 Chronic kidney disease, unspecified: Secondary | ICD-10-CM | POA: Diagnosis not present

## 2020-09-20 LAB — GASTROINTESTINAL PANEL BY PCR, STOOL (REPLACES STOOL CULTURE)

## 2020-09-20 LAB — BASIC METABOLIC PANEL
Anion gap: 10 (ref 5–15)
BUN: 15 mg/dL (ref 8–23)
CO2: 23 mmol/L (ref 22–32)
Calcium: 8.2 mg/dL — ABNORMAL LOW (ref 8.9–10.3)
Chloride: 102 mmol/L (ref 98–111)
Creatinine, Ser: 1.2 mg/dL — ABNORMAL HIGH (ref 0.44–1.00)
GFR calc non Af Amer: 42 mL/min — ABNORMAL LOW (ref >60)
Glucose, Bld: 113 mg/dL — ABNORMAL HIGH (ref 70–99)
Potassium: 4.5 mmol/L (ref 3.5–5.1)
Sodium: 135 mmol/L (ref 135–145)

## 2020-09-20 LAB — GLUCOSE, CAPILLARY
Glucose-Capillary: 91 mg/dL (ref 70–99)
Glucose-Capillary: 125 mg/dL — ABNORMAL HIGH (ref 70–99)
Glucose-Capillary: 100 mg/dL — ABNORMAL HIGH (ref 70–99)
Glucose-Capillary: 118 mg/dL — ABNORMAL HIGH (ref 70–99)

## 2020-09-20 LAB — SEDIMENTATION RATE: Sed Rate: 101 mm/hr — ABNORMAL HIGH (ref 0–22)

## 2020-09-20 MED ORDER — DOXYCYCLINE HYCLATE 100 MG PO TABS
100.0000 mg | ORAL_TABLET | Freq: Two times a day (BID) | ORAL | Status: DC
  Administered 2020-09-20 – 2020-09-23 (×7): 100 mg via ORAL
  Filled 2020-09-20 (×7): qty 1

## 2020-09-20 NOTE — Plan of Care (Signed)
  Problem: Nutrition: Goal: Adequate nutrition will be maintained Outcome: Not Progressing   

## 2020-09-20 NOTE — Progress Notes (Signed)
HOSPITAL MEDICINE OVERNIGHT EVENT NOTE    Notified by nursing that MEWS score came back yellow, particularly due to elevated heart rate.  Chart reviewed, patient initially hospitalized for acute kidney injury, noted to have diarrhea with GI pathogen panel revealing yersinia enterocolitica for which patient is being initiated on doxycycline IV this evening.  Patient hemodynamically stable, no sudden changes in mentation.  Patient is afebrile.  According to nursing, patient is complaining of some abdominal cramping and is being administered Tylenol for mild discomfort.  No change in care plan at this time.  Continue to monitor.   Vernelle Emerald  MD [facility]

## 2020-09-20 NOTE — Progress Notes (Signed)
PROGRESS NOTE    Anna Stone  UXL:[id number] DOB: 1941-10-23 DOA: 09/17/2020 PCP: Marco Collie, MD   Brief Narrative: 79 year old with past medical history significant for diabetes, CKD stage IIIa, hypertension, hyperlipidemia who presents to the emergency room for evaluation of generalized weakness.  Patient was recently admitted in September for management of sepsis due to sigmoid colitis, she was also found to have AKI at that time was treated with IV antibiotics and fluids.  She was recommended a skilled nursing facility on discharge but she did not declined and went home.  Since she has been at home she been feeling weak, unable to ambulate.  She also complained of diarrhea and decreased oral intake. On admission she was found to have AKI, started on IV fluids.  PT OT recommending skilled nursing facility.     Assessment & Plan:   Principal Problem:   Acute kidney injury superimposed on CKD ([place]) Active Problems:   Type 2 diabetes mellitus with stage 3 chronic kidney disease ([place])   Hypertension associated with diabetes ([place])   Left adrenal mass ([facility])   Generalized weakness   Hyperlipidemia associated with type 2 diabetes mellitus ([place])  1-AKI on CKD stage IIIa: Secondary to prerenal, poor oral intake diuretics ACE and ARB. She received IV fluids. Creatinine baseline 1.2-1.4 Continue to hold diuretics. Renal function improving  2-Generalized weakness: Failure to thrive: PT/OT recommending skilled nursing facility  3-Diarrhea: Acute on chronic: Recently  treated for colitis. GI pathogen pending Check lactoferrin If Persist, may need GI eval GI; growing yersinia enterocolitica.  Plan to start doxy   4-Hyperkalemia;  Resolved.   5-Rash LE, purpuric  Check ESR, C ANCA, ANA<  No thrombocytopenia.   Hypotension;  BP on hold initially.  Restated on norvasc.   DM; type 2; SSI  Left adrenal mass; needs follow up  Hypomagnesemia;  replete  Estimated body mass index is 30.2 kg/m as calculated from the following:   Height as of this encounter: $RemoveBeforeD'5\' 2"'[id number]$  (1.575 m).   Weight as of this encounter: 74.9 kg.   DVT prophylaxis: Heparin Code Status: DNR Family Communication: Care discussed with patient Disposition Plan:  Status is: Inpatient  Remains inpatient appropriate because:Ongoing diagnostic testing needed not appropriate for outpatient work up   Dispo: The patient is from: Home              Anticipated d/c is to: SNF              Anticipated d/c date is: 2 days              Patient currently is not medically stable to d/c.        Consultants:   None  Procedures:   None  Antimicrobials:    Subjective: She is alert, she is still having diarrhea 1 or 2 episodes per day loose stool  Objective: Vitals:   09/19/20 1645 09/19/20 2047 09/20/20 0438 09/20/20 0856  BP: (!) 151/69 (!) 139/57 140/70 131/70  Pulse: (!) 106 (!) 107 (!) 105 (!) 108  Resp: $Remo'18 18 18 19  'QjPOh$ Temp: 98.3 F (36.8 C) 98.5 F (36.9 C) 98.7 F (37.1 C) (!) 97.5 F (36.4 C)  TempSrc: Oral Oral Oral Oral  SpO2: 95% 95% 96% 92%  Weight:      Height:        Intake/Output Summary (Last 24 hours) at 09/20/2020 1626 Last data filed at 09/20/2020 1350 Gross per 24 hour  Intake 1658.8 ml  Output 200  ml  Net 1458.8 ml   Filed Weights   09/17/20 1503 09/18/20 1443 09/19/20 0500  Weight: 77.1 kg 74.7 kg 74.9 kg    Examination:  General exam: Appears calm and comfortable  Respiratory system: Clear to auscultation. Respiratory effort normal. Cardiovascular system: S1 & S2 heard, RRR. No JVD, murmurs, rubs, gallops or clicks. No pedal edema. Gastrointestinal system: Abdomen is nondistended, soft and nontender. No organomegaly or masses felt. Normal bowel sounds heard. Central nervous system: Alert and oriented. No focal neurological deficits. Extremities: Symmetric 5 x 5 power.  Bilateral lower extremity purpuric rash    Data  Reviewed: I have personally reviewed following labs and imaging studies  CBC: Recent Labs  Lab 09/17/20 1520 09/18/20 0427  WBC 7.3 6.6  HGB 11.3* 10.6*  HCT 35.7* 31.9*  MCV 94.7 94.7  PLT 199 510   Basic Metabolic Panel: Recent Labs  Lab 09/17/20 1520 09/17/20 2318 09/18/20 0427 09/19/20 0438 09/20/20 0055  NA 133*  --  134* 136 135  K 4.1  --  4.6 5.5* 4.5  CL 93*  --  97* 97* 102  CO2 27  --  $R'27 27 23  'on$ GLUCOSE 122*  --  98 125* 113*  BUN 19  --  $R'23 21 15  'Tm$ CREATININE 2.37*  --  2.47* 1.56* 1.20*  CALCIUM 8.8*  --  8.4* 9.0 8.2*  MG  --  1.4*  --   --   --    GFR: Estimated Creatinine Clearance: 34.8 mL/min (A) (by C-G formula based on SCr of 1.2 mg/dL (H)). Liver Function Tests: No results for input(s): AST, ALT, ALKPHOS, BILITOT, PROT, ALBUMIN in the last 168 hours. No results for input(s): LIPASE, AMYLASE in the last 168 hours. No results for input(s): AMMONIA in the last 168 hours. Coagulation Profile: No results for input(s): INR, PROTIME in the last 168 hours. Cardiac Enzymes: No results for input(s): CKTOTAL, CKMB, CKMBINDEX, TROPONINI in the last 168 hours. BNP (last 3 results) No results for input(s): PROBNP in the last 8760 hours. HbA1C: No results for input(s): HGBA1C in the last 72 hours. CBG: Recent Labs  Lab 09/19/20 1146 09/19/20 1643 09/19/20 2045 09/20/20 0634 09/20/20 1209  GLUCAP 92 76 96 91 125*   Lipid Profile: No results for input(s): CHOL, HDL, LDLCALC, TRIG, CHOLHDL, LDLDIRECT in the last 72 hours. Thyroid Function Tests: Recent Labs    09/17/20 2320  TSH 2.152   Anemia Panel: No results for input(s): VITAMINB12, FOLATE, FERRITIN, TIBC, IRON, RETICCTPCT in the last 72 hours. Sepsis Labs: Recent Labs  Lab 09/17/20 1937  LATICACIDVEN 1.1    Recent Results (from the past 240 hour(s))  Blood culture (routine x 2)     Status: None (Preliminary result)   Collection Time: 09/17/20 11:20 PM   Specimen: BLOOD  Result Value  Ref Range Status   Specimen Description BLOOD RIGHT ANTECUBITAL  Final   Special Requests   Final    BOTTLES DRAWN AEROBIC AND ANAEROBIC Blood Culture results may not be optimal due to an inadequate volume of blood received in culture bottles   Culture   Final    NO GROWTH 3 DAYS Performed at [facility], [place] [address], [place], [place] [address]    Report Status PENDING  Incomplete  Blood culture (routine x 2)     Status: None (Preliminary result)   Collection Time: 09/17/20 11:20 PM   Specimen: BLOOD RIGHT HAND  Result Value Ref Range Status  Specimen Description BLOOD RIGHT HAND  Final   Special Requests   Final    BOTTLES DRAWN AEROBIC AND ANAEROBIC Blood Culture adequate volume   Culture   Final    NO GROWTH 3 DAYS Performed at [facility], [address], [place], [place] [address]    Report Status PENDING  Incomplete  Respiratory Panel by RT PCR (Flu A&B, Covid) - Nasopharyngeal Swab     Status: None   Collection Time: 09/17/20 11:31 PM   Specimen: Nasopharyngeal Swab  Result Value Ref Range Status   SARS Coronavirus 2 by RT PCR NEGATIVE NEGATIVE Final    Comment: (NOTE) SARS-CoV-2 target nucleic acids are NOT DETECTED.  The SARS-CoV-2 RNA is generally detectable in upper respiratoy specimens during the acute phase of infection. The lowest concentration of SARS-CoV-2 viral copies this assay can detect is 131 copies/mL. A negative result does not preclude SARS-Cov-2 infection and should not be used as the sole basis for treatment or other patient management decisions. A negative result may occur with  improper specimen collection/handling, submission of specimen other than nasopharyngeal swab, presence of viral mutation(s) within the areas targeted by this assay, and inadequate number of viral copies (<131 copies/mL). A negative result must be combined with clinical observations, patient history, and epidemiological information. The expected result is  Negative.  Fact Sheet for Patients:  PinkCheek.be  Fact Sheet for Healthcare Providers:  GravelBags.it  This test is no t yet approved or cleared by the [place] FDA and  has been authorized for detection and/or diagnosis of SARS-CoV-2 by FDA under an Emergency Use Authorization (EUA). This EUA will remain  in effect (meaning this test can be used) for the duration of the COVID-19 declaration under Section 564(b)(1) of the Act, 21 U.S.C. section 360bbb-3(b)(1), unless the authorization is terminated or revoked sooner.     Influenza A by PCR NEGATIVE NEGATIVE Final   Influenza B by PCR NEGATIVE NEGATIVE Final    Comment: (NOTE) The Xpert Xpress SARS-CoV-2/FLU/RSV assay is intended as an aid in  the diagnosis of influenza from Nasopharyngeal swab specimens and  should not be used as a sole basis for treatment. Nasal washings and  aspirates are unacceptable for Xpert Xpress SARS-CoV-2/FLU/RSV  testing.  Fact Sheet for Patients: PinkCheek.be  Fact Sheet for Healthcare Providers: GravelBags.it  This test is not yet approved or cleared by the [place] FDA and  has been authorized for detection and/or diagnosis of SARS-CoV-2 by  FDA under an Emergency Use Authorization (EUA). This EUA will remain  in effect (meaning this test can be used) for the duration of the  Covid-19 declaration under Section 564(b)(1) of the Act, 21  U.S.C. section 360bbb-3(b)(1), unless the authorization is  terminated or revoked. Performed at [facility], [place] [address] [place], [place] [address]   Urine Culture     Status: Abnormal   Collection Time: 09/18/20 11:37 AM   Specimen: Urine, Random  Result Value Ref Range Status   Specimen Description URINE, RANDOM  Final   Special Requests NONE  Final   Culture (A)  Final    <10,000 COLONIES/mL INSIGNIFICANT  GROWTH Performed at [facility], [place] [address] [place],  [address]    Report Status 09/19/2020 FINAL  Final         Radiology Studies: ECHOCARDIOGRAM COMPLETE  Result Date: 09/19/2020    ECHOCARDIOGRAM REPORT   Patient Name:   [facility] RUTH Alphonzo Severance Date of Exam:  09/19/2020 Medical Rec #:  [id number]                    Height:       62.0 in Accession #:    [id number]                   Weight:       165.1 lb Date of Birth:  September 18, 1941                    BSA:          1.762 m Patient Age:    79 years                     BP:           153/87 mmHg Patient Gender: F                            HR:           102 bpm. Exam Location:  Inpatient Procedure: 2D Echo, Cardiac Doppler, Color Doppler and Intracardiac            Opacification Agent Indications:    Congestive Heart Failure 428.0 / I50.9  History:        Patient has no prior history of Echocardiogram examinations.                 Risk Factors:Hypertension, Diabetes and Non-Smoker. GERD.  Sonographer:    Renella Cunas RDCS Referring Phys: [id number] AMRIT ADHIKARI IMPRESSIONS  1. Left ventricular ejection fraction, by estimation, is 65 to 70%. The left ventricle has normal function. The left ventricle has no regional wall motion abnormalities. Left ventricular diastolic parameters are consistent with Grade I diastolic dysfunction (impaired relaxation). Elevated left ventricular end-diastolic pressure.  2. Right ventricular systolic function is normal. The right ventricular size is mildly enlarged. There is normal pulmonary artery systolic pressure.  3. The mitral valve is normal in structure. No evidence of mitral valve regurgitation. No evidence of mitral stenosis.  4. The aortic valve is tricuspid. Aortic valve regurgitation is not visualized. No aortic stenosis is present.  5. The inferior vena cava is normal in size with greater than 50% respiratory variability, suggesting right atrial pressure of 3 mmHg. FINDINGS  Left Ventricle: Left  ventricular ejection fraction, by estimation, is 65 to 70%. The left ventricle has normal function. The left ventricle has no regional wall motion abnormalities. Definity contrast agent was given IV to delineate the left ventricular  endocardial borders. The left ventricular internal cavity size was normal in size. There is no left ventricular hypertrophy. Left ventricular diastolic parameters are consistent with Grade I diastolic dysfunction (impaired relaxation). Elevated left ventricular end-diastolic pressure. Right Ventricle: The right ventricular size is mildly enlarged. No increase in right ventricular wall thickness. Right ventricular systolic function is normal. There is normal pulmonary artery systolic pressure. The tricuspid regurgitant velocity is 2.52  m/s, and with an assumed right atrial pressure of 3 mmHg, the estimated right ventricular systolic pressure is 28.4 mmHg. Left Atrium: Left atrial size was normal in size. Right Atrium: Right atrial size was normal in size. Pericardium: There is no evidence of pericardial effusion. Mitral Valve: The mitral valve is normal in structure. No evidence of mitral valve regurgitation. No evidence of mitral valve stenosis. Tricuspid Valve: The tricuspid valve is normal in structure. Tricuspid valve regurgitation is mild . No  evidence of tricuspid stenosis. Aortic Valve: The aortic valve is tricuspid. Aortic valve regurgitation is not visualized. No aortic stenosis is present. Pulmonic Valve: The pulmonic valve was normal in structure. Pulmonic valve regurgitation is not visualized. No evidence of pulmonic stenosis. Aorta: The aortic root is normal in size and structure. Venous: The inferior vena cava is normal in size with greater than 50% respiratory variability, suggesting right atrial pressure of 3 mmHg. IAS/Shunts: No atrial level shunt detected by color flow Doppler.  LEFT VENTRICLE PLAX 2D LVIDd:         4.20 cm     Diastology LVIDs:         2.70 cm     LV  e' medial:    4.24 cm/s LV PW:         0.70 cm     LV E/e' medial:  17.7 LV IVS:        0.70 cm     LV e' lateral:   7.62 cm/s LVOT diam:     2.00 cm     LV E/e' lateral: 9.8 LV SV:         56 LV SV Index:   32 LVOT Area:     3.14 cm  LV Volumes (MOD) LV vol d, MOD A2C: 65.8 ml LV vol d, MOD A4C: 78.4 ml LV vol s, MOD A2C: 16.0 ml LV vol s, MOD A4C: 22.9 ml LV SV MOD A2C:     49.8 ml LV SV MOD A4C:     78.4 ml LV SV MOD BP:      54.1 ml RIGHT VENTRICLE RV S prime:     14.90 cm/s TAPSE (M-mode): 1.6 cm LEFT ATRIUM           Index      RIGHT ATRIUM          Index LA diam:      3.00 cm 1.70 cm/m RA Area:     5.50 cm LA Vol (A2C): 16.5 ml 9.36 ml/m RA Volume:   7.09 ml  4.02 ml/m LA Vol (A4C): 14.3 ml 8.11 ml/m  AORTIC VALVE LVOT Vmax:   120.00 cm/s LVOT Vmean:  78.300 cm/s LVOT VTI:    0.177 m  AORTA Ao Root diam: 2.70 cm MITRAL VALVE               TRICUSPID VALVE MV Area (PHT): 4.36 cm    TR Peak grad:   25.4 mmHg MV Decel Time: 174 msec    TR Vmax:        252.00 cm/s MV E velocity: 75.00 cm/s MV A velocity: 96.00 cm/s  SHUNTS MV E/A ratio:  0.78        Systemic VTI:  0.18 m                            Systemic Diam: 2.00 cm Skeet Latch MD Electronically signed by Skeet Latch MD Signature Date/Time: 09/19/2020/12:19:52 PM    Final         Scheduled Meds:  amLODipine  10 mg Oral Daily   doxazosin  1 mg Oral QHS   heparin  5,000 Units Subcutaneous Q8H   insulin aspart  0-9 Units Subcutaneous TID WC   rosuvastatin  5 mg Oral QHS   Continuous Infusions:  sodium chloride 75 mL/hr at 09/19/20 1215     LOS: 2 days    Time spent: 35 minutes.  Elmarie Shiley, MD [facility]   If 7PM-7AM, please contact night-coverage [email or web address]  09/20/2020, 4:26 PM

## 2020-09-20 NOTE — TOC Progression Note (Addendum)
Transition of Care [facility]) - Progression Note    Patient Details  Name: Anna Stone MRN: [id number] Date of Birth: 05-24-1941  Transition of Care [facility]) CM/SW Contact  Sharlet Salina Mila Homer, LCSW Phone Number: 09/20/2020, 3:31 PM  Clinical Narrative:  CSW talked this morning with Olivia Mackie, admissions director at [facility] regarding patient needing ST rehab and requesting [facility]. Olivia Mackie reported that she had spoken with one of patient's sons and informed him that they don't have a bed right now, and she will know more about bed availability this afternoon. Olivia Mackie advised that patient is Medicare A&B and she would not be able to discharge until 10/7, as she was made inpatient on 10/4 (must have a 3-midnight qualifying stay).   3:23 pm: Talked again with Olivia Mackie with [facility] and was informed that they will have a bed for [name] on Friday and she will call patient's son. Visited room 3:27 pm and patient asleep. CSW will re-visit room later this afternoon.    Expected Discharge Plan: Skilled Nursing Facility Barriers to Discharge: SNF Pending bed offer (Initiating facility search today)  Expected Discharge Plan and Services Expected Discharge Plan: [facility] In-house Referral: Clinical Social Work   Post Acute Care Choice: Heath Living arrangements for the past 2 months: Single Family Home                                      Social Determinants of Health (SDOH) Interventions  None requested or needed at this time.  Readmission Risk Interventions No flowsheet data found.

## 2020-09-20 NOTE — Progress Notes (Signed)
Physical Therapy Treatment Patient Details Name: Anna Stone MRN: [id number] DOB: May 27, 1941 Today's Date: 09/20/2020    History of Present Illness Pt is an 79 y/o female admitted secondary to progressive weakness. Found to have AKI. PMH includes DM, HTN, CKD.     PT Comments    Continuing work on functional mobility and activity tolerance;  Session focused on assessment and work on functional transfers; Required 2 person mod assist to stand and pivot to recliner; Pt showed good effort despite being quite anxious and fearful of falling;  Consider using the stedy for practice with sit<>stand  Follow Up Recommendations  SNF     Equipment Recommendations  Wheelchair (measurements PT);Wheelchair cushion (measurements PT)    Recommendations for Other Services       Precautions / Restrictions Precautions Precautions: Fall Precaution Comments: hx of falls    Mobility  Bed Mobility Overal bed mobility: Needs Assistance Bed Mobility: Supine to Sit     Supine to sit: Mod assist;+2 for safety/equipment     General bed mobility comments: Cues for technqiue and hand placement and light mod assist to helpbring trunk to upright and square off hips at EOB  Transfers Overall transfer level: Needs assistance Equipment used: 2 person hand held assist Transfers: Sit to/from Stand Sit to Stand: Mod assist;+2 physical assistance;+2 safety/equipment         General transfer comment: Mod assist and multimodal cues to initiate with forward lean; close guard fo knees for stability, but noted good, solid effort at power up on pt's part; pivot steps bed to chair with heavy mod assist  Ambulation/Gait                 Stairs             Wheelchair Mobility    Modified Rankin (Stroke Patients Only)       Balance     Sitting balance-Leahy Scale: Fair       Standing balance-Leahy Scale: Poor                              Cognition  Arousal/Alertness: Awake/alert Behavior During Therapy: WFL for tasks assessed/performed;Anxious Overall Cognitive Status: Within Functional Limits for tasks assessed                                 General Comments: Fearful of falling      Exercises      General Comments        Pertinent Vitals/Pain Pain Assessment: Faces Faces Pain Scale: Hurts a little bit Pain Location: L posterior ankle pain when donning shoes Pain Descriptors / Indicators: Grimacing Pain Intervention(s): Monitored during session    Home Living                      Prior Function            PT Goals (current goals can now be found in the care plan section) Acute Rehab PT Goals Patient Stated Goal: to be able to walk  PT Goal Formulation: With patient Time For Goal Achievement: 10/02/20 Potential to Achieve Goals: Good Progress towards PT goals: Progressing toward goals    Frequency    Min 2X/week      PT Plan Discharge plan needs to be updated;Frequency needs to be updated    Co-evaluation  AM-PAC PT "6 Clicks" Mobility   Outcome Measure  Help needed turning from your back to your side while in a flat bed without using bedrails?: A Little Help needed moving from lying on your back to sitting on the side of a flat bed without using bedrails?: A Little Help needed moving to and from a bed to a chair (including a wheelchair)?: A Lot Help needed standing up from a chair using your arms (e.g., wheelchair or bedside chair)?: A Lot Help needed to walk in hospital room?: A Lot Help needed climbing 3-5 steps with a railing? : Total 6 Click Score: 13    End of Session Equipment Utilized During Treatment: Gait belt Activity Tolerance: Patient tolerated treatment well (though anxious) Patient left: in chair;with call bell/phone within reach;with chair alarm set Nurse Communication: Mobility status PT Visit Diagnosis: Muscle weakness (generalized)  (M62.81);Difficulty in walking, not elsewhere classified (R26.2)     Time: 1200-1229 PT Time Calculation (min) (ACUTE ONLY): 29 min  Charges:  $Therapeutic Activity: 23-37 mins                     Roney Marion, PT  Acute Rehabilitation Services Pager [phone] Office [place] 09/20/2020, 1:47 PM

## 2020-09-21 ENCOUNTER — Inpatient Hospital Stay (HOSPITAL_COMMUNITY): Payer: Medicare Other

## 2020-09-21 DIAGNOSIS — N179 Acute kidney failure, unspecified: Secondary | ICD-10-CM | POA: Diagnosis not present

## 2020-09-21 DIAGNOSIS — N189 Chronic kidney disease, unspecified: Secondary | ICD-10-CM | POA: Diagnosis not present

## 2020-09-21 LAB — COMPREHENSIVE METABOLIC PANEL
ALT: 15 U/L (ref 0–44)
AST: 14 U/L — ABNORMAL LOW (ref 15–41)
Albumin: 2.3 g/dL — ABNORMAL LOW (ref 3.5–5.0)
Alkaline Phosphatase: 38 U/L (ref 38–126)
Anion gap: 14 (ref 5–15)
BUN: 16 mg/dL (ref 8–23)
CO2: 17 mmol/L — ABNORMAL LOW (ref 22–32)
Calcium: 8.1 mg/dL — ABNORMAL LOW (ref 8.9–10.3)
Chloride: 106 mmol/L (ref 98–111)
Creatinine, Ser: 1.11 mg/dL — ABNORMAL HIGH (ref 0.44–1.00)
GFR calc non Af Amer: 47 mL/min — ABNORMAL LOW (ref >60)
Glucose, Bld: 128 mg/dL — ABNORMAL HIGH (ref 70–99)
Potassium: 4.3 mmol/L (ref 3.5–5.1)
Sodium: 137 mmol/L (ref 135–145)
Total Bilirubin: 1.1 mg/dL (ref 0.3–1.2)
Total Protein: 5 g/dL — ABNORMAL LOW (ref 6.5–8.1)

## 2020-09-21 LAB — ANCA TITERS
Atypical P-ANCA titer: <1:20 {titer}
C-ANCA: <1:20 {titer}
P-ANCA: <1:20 {titer}

## 2020-09-21 LAB — CBC
HCT: 28.3 % — ABNORMAL LOW (ref 36.0–46.0)
Hemoglobin: 9.2 g/dL — ABNORMAL LOW (ref 12.0–15.0)
MCH: 30.1 pg (ref 26.0–34.0)
MCHC: 32.5 g/dL (ref 30.0–36.0)
MCV: 92.5 fL (ref 80.0–100.0)
Platelets: 162 10*3/uL (ref 150–400)
RBC: 3.06 MIL/uL — ABNORMAL LOW (ref 3.87–5.11)
RDW: 14.7 % (ref 11.5–15.5)
WBC: 7.9 10*3/uL (ref 4.0–10.5)
nRBC: 0 % (ref 0.0–0.2)

## 2020-09-21 LAB — GLUCOSE, CAPILLARY
Glucose-Capillary: 109 mg/dL — ABNORMAL HIGH (ref 70–99)
Glucose-Capillary: 98 mg/dL (ref 70–99)
Glucose-Capillary: 101 mg/dL — ABNORMAL HIGH (ref 70–99)
Glucose-Capillary: 212 mg/dL — ABNORMAL HIGH (ref 70–99)

## 2020-09-21 LAB — ANA W/REFLEX IF POSITIVE: Anti Nuclear Antibody (ANA): NEGATIVE

## 2020-09-21 LAB — LACTOFERRIN, FECAL, QUALITATIVE: Lactoferrin, Fecal, Qual: POSITIVE — AB

## 2020-09-21 MED ORDER — LACTATED RINGERS IV BOLUS
500.0000 mL | Freq: Once | INTRAVENOUS | Status: AC
  Administered 2020-09-21: 500 mL via INTRAVENOUS

## 2020-09-21 MED ORDER — ENSURE ENLIVE PO LIQD
237.0000 mL | Freq: Two times a day (BID) | ORAL | Status: DC
  Administered 2020-09-21 – 2020-09-26 (×10): 237 mL via ORAL

## 2020-09-21 MED ORDER — SODIUM CHLORIDE 0.9 % IV BOLUS
500.0000 mL | Freq: Once | INTRAVENOUS | Status: AC
  Administered 2020-09-21: 500 mL via INTRAVENOUS

## 2020-09-21 MED ORDER — NYSTATIN 100000 UNIT/ML MT SUSP
5.0000 mL | Freq: Four times a day (QID) | OROMUCOSAL | Status: DC
  Administered 2020-09-21 – 2020-09-26 (×20): 500000 [IU] via ORAL
  Filled 2020-09-21 (×19): qty 5

## 2020-09-21 MED ORDER — SODIUM CHLORIDE 0.9 % IV BOLUS: 250.0000 mL | Freq: Once | INTRAVENOUS | Status: DC

## 2020-09-21 NOTE — Progress Notes (Signed)
   09/20/20 1940  Assess: MEWS Score  Temp (!) 97.5 F (36.4 C)  BP 124/70  Pulse Rate (!) 120  Resp 20  SpO2 97 %  O2 Device Room Air  Assess: MEWS Score  MEWS Temp 0  MEWS Systolic 0  MEWS Pulse 2  MEWS RR 0  MEWS LOC 0  MEWS Score 2  MEWS Score Color Yellow  Assess: if the MEWS score is Yellow or Red  Were vital signs taken at a resting state? Yes  Focused Assessment No change from prior assessment  Early Detection of Sepsis Score *See Row Information* Low  MEWS guidelines implemented *See Row Information* Yes  Take Vital Signs  Increase Vital Sign Frequency  Yellow: Q 2hr X 2 then Q 4hr X 2, if remains yellow, continue Q 4hrs  Escalate  MEWS: Escalate Yellow: discuss with charge nurse/RN and consider discussing with provider and RRT  Notify: Charge Nurse/RN  Name of Charge Nurse/RN Notified Sima Matas, RN  Date Charge Nurse/RN Notified 09/20/20  Time Charge Nurse/RN Notified 2002  Notify: Provider  Provider Name/Title G. Shalhoub  Date Provider Notified 09/20/20  Time Provider Notified 2002  Notification Type Page  Notification Reason Change in status  Response No new orders  Date of Provider Response 09/20/20  Time of Provider Response 2020  Notify: Rapid Response  Name of Rapid Response RN Notified No need to call Rapid  Document  Patient Outcome Other (Comment) (Patient stable and remains on department )  Progress note created (see row info) Yes

## 2020-09-21 NOTE — Progress Notes (Signed)
PROGRESS NOTE    Anna Stone  QKM:[id number] DOB: 23-Jul-1941 DOA: 09/17/2020 PCP: Marco Collie, MD   Brief Narrative: 79 year old with past medical history significant for diabetes, CKD stage IIIa, hypertension, hyperlipidemia who presents to the emergency room for evaluation of generalized weakness.  Patient was recently admitted in September for management of sepsis due to sigmoid colitis, she was also found to have AKI at that time was treated with IV antibiotics and fluids.  She was recommended a skilled nursing facility on discharge but she did declined and went home.  Since she has been at home she been feeling weak, unable to ambulate.  She also complained of diarrhea and decreased oral intake. On admission she was found to have AKI, started on IV fluids.  PT OT recommending skilled nursing facility.  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 She was started on Doxycycline. Patient continue to have poor oral intake.    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 She received IV fluids. Creatinine baseline 1.2-1.4 Continue to hold diuretics. Renal function improving Metabolic acidosis, likely related to diarrhea. Plan to give more fluids today.   2-Generalized weakness: Failure to thrive: PT/OT recommending skilled nursing facility Discussed with staff, patient needs to be out bed.   3-Diarrhea: Acute on chronic: Recently  treated for colitis. Lactoferrin pending. GI; growing yersinia enterocolitica.  Started on Doxycycline.   4-Hyperkalemia;  Resolved.   5-Rash LE,  purpuric  ESR elevated , C ANCA, ANA< negative.  No thrombocytopenia.   Hypotension;  BP on hold initially.  Restated on norvasc.   DM; type 2; SSI  Left adrenal mass; Needs follow up.  Hypomagnesemia; replete  Estimated body mass index is 27.82 kg/m as calculated from the following:   Height as of this encounter: $RemoveBeforeD'5\' 2"'tgmPDOgmMssLBj$  (1.575 m).   Weight as of this encounter: 69 kg.   DVT prophylaxis: Heparin Code Status: DNR Family Communication: Care discussed with patient and son 10/06 Disposition Plan:  Status is: Inpatient  Remains inpatient appropriate because:Ongoing diagnostic testing needed not appropriate for outpatient work up   Dispo: The patient is from: Home              Anticipated d/c is to: SNF              Anticipated d/c date is: 2 days              Patient currently is not medically stable to d/c.     Consultants:   None  Procedures:   None  Antimicrobials:    Subjective: Alert, she is feeling better today. Denies abdominal pain. Still not eating. She will try to eat some.   Objective: Vitals:   09/20/20 2219 09/20/20 2345 09/21/20 0349 09/21/20 0737  BP: (!) 135/56 (!) 133/56 (!) 131/52 (!) 128/57  Pulse: (!) 108 (!) 112 (!) 122 (!) 121  Resp: $Remo'18 20 19 18  'zTmlT$ Temp: 98.7 F (37.1 C) 97.6 F (36.4 C) 98.6 F (37 C) 98.7 F (37.1 C)  TempSrc: Oral Oral Oral Oral  SpO2: 94% 95% (!) 68% 99%  Weight:   69 kg   Height:        Intake/Output Summary (Last 24 hours) at 09/21/2020 1342 Last data filed at 09/21/2020 1251 Gross per 24 hour  Intake 2680.26 ml  Output 100 ml  Net 2580.26 ml   Filed Weights   09/18/20 1443 09/19/20 0500 09/21/20 0349  Weight: 74.7 kg 74.9 kg 69 kg    Examination:  General exam: NAD Respiratory system: S 1, S 2 RRR Cardiovascular system: S 1, S 2 RRR. Gastrointestinal system: BS present, soft, nt Central nervous system: Alert, following command Extremities:  Bilateral purpuric rash.     Data Reviewed: I have  personally reviewed following labs and imaging studies  CBC: Recent Labs  Lab 09/17/20 1520 09/18/20 0427 09/21/20 0145  WBC 7.3 6.6 7.9  HGB 11.3* 10.6* 9.2*  HCT 35.7* 31.9* 28.3*  MCV 94.7 94.7 92.5  PLT 199 205 944   Basic Metabolic Panel: Recent Labs  Lab 09/17/20 1520 09/17/20 2318 09/18/20 0427 09/19/20 0438 09/20/20 0055 09/21/20 0145  NA 133*  --  134* 136 135 137  K 4.1  --  4.6 5.5* 4.5 4.3  CL 93*  --  97* 97* 102 106  CO2 27  --  $R'27 27 23 'iZ$ 17*  GLUCOSE 122*  --  98 125* 113* 128*  BUN 19  --  $R'23 21 15 16  'ku$ CREATININE 2.37*  --  2.47* 1.56* 1.20* 1.11*  CALCIUM 8.8*  --  8.4* 9.0 8.2* 8.1*  MG  --  1.4*  --   --   --   --    GFR: Estimated Creatinine Clearance: 36.2 mL/min (A) (by C-G formula based on SCr of 1.11 mg/dL (H)). Liver Function Tests: Recent Labs  Lab 09/21/20 0145  AST 14*  ALT 15  ALKPHOS 38  BILITOT 1.1  PROT 5.0*  ALBUMIN 2.3*   No results for input(s): LIPASE, AMYLASE in the last 168 hours. No results for input(s): AMMONIA in the last 168 hours. Coagulation Profile: No results for input(s): INR, PROTIME in the last 168 hours. Cardiac Enzymes: No results for input(s): CKTOTAL, CKMB, CKMBINDEX, TROPONINI in the last 168 hours. BNP (last 3 results) No results for input(s): PROBNP in the last 8760 hours. HbA1C: No results for input(s): HGBA1C in the last 72 hours. CBG: Recent Labs  Lab 09/20/20 1209 09/20/20 1630 09/20/20 2208 09/21/20 0635 09/21/20 1139  GLUCAP 125* 100* 118* 109* 98   Lipid Profile: No results for input(s): CHOL, HDL, LDLCALC, TRIG, CHOLHDL, LDLDIRECT in the last 72 hours. Thyroid Function Tests: No results for input(s): TSH, T4TOTAL, FREET4, T3FREE, THYROIDAB in the last 72 hours. Anemia Panel: No results for input(s): VITAMINB12, FOLATE, FERRITIN, TIBC, IRON, RETICCTPCT in the last 72 hours. Sepsis Labs: Recent Labs  Lab 09/17/20 1937  LATICACIDVEN 1.1    Recent Results (from the past 240  hour(s))  Blood culture (routine x 2)     Status: None (Preliminary result)   Collection Time: 09/17/20 11:20 PM   Specimen: BLOOD  Result Value Ref Range Status   Specimen Description BLOOD RIGHT ANTECUBITAL  Final   Special Requests   Final    BOTTLES DRAWN AEROBIC AND ANAEROBIC Blood Culture results may not be optimal due to an inadequate volume of blood received in culture bottles   Culture   Final    NO GROWTH 3 DAYS Performed at [facility], [place] [address], [place], [place] [address]    Report Status PENDING  Incomplete  Blood culture (routine x 2)     Status: None (Preliminary result)   Collection Time: 09/17/20 11:20 PM   Specimen: BLOOD RIGHT HAND  Result Value Ref Range Status   Specimen Description BLOOD RIGHT HAND  Final   Special Requests   Final    BOTTLES DRAWN AEROBIC AND ANAEROBIC Blood Culture adequate volume   Culture   Final    NO GROWTH 3 DAYS Performed at [facility], [place] [address] [place], [place] [address]    Report Status PENDING  Incomplete  Respiratory Panel by RT PCR (Flu A&B, Covid) - Nasopharyngeal Swab     Status: None   Collection Time: 09/17/20 11:31 PM   Specimen: Nasopharyngeal Swab  Result Value Ref Range Status   SARS Coronavirus 2 by RT PCR NEGATIVE NEGATIVE Final    Comment: (NOTE) SARS-CoV-2 target nucleic acids are NOT DETECTED.  The SARS-CoV-2 RNA is generally detectable in upper respiratoy specimens during the acute phase of infection. The lowest concentration of SARS-CoV-2 viral copies this assay can detect is 131 copies/mL. A negative result does not preclude SARS-Cov-2 infection and should not be used as the sole basis for treatment or other patient management decisions. A negative result may occur with  improper specimen collection/handling, submission of specimen other than nasopharyngeal swab, presence of viral mutation(s) within the areas targeted by this assay, and inadequate number of viral copies (<131  copies/mL). A negative result must be combined with clinical observations, patient history, and epidemiological information. The expected result is Negative.  Fact Sheet for Patients:  PinkCheek.be  Fact Sheet for Healthcare Providers:  GravelBags.it  This test is no t yet approved or cleared by the [place] FDA and  has been authorized for detection and/or diagnosis of SARS-CoV-2 by FDA under an Emergency Use Authorization (EUA). This EUA will remain  in effect (meaning this test can be used) for the duration of the COVID-19 declaration under Section 564(b)(1) of the Act, 21 U.S.C. section 360bbb-3(b)(1), unless the authorization is terminated or revoked sooner.     Influenza A by PCR NEGATIVE NEGATIVE Final   Influenza B by PCR NEGATIVE NEGATIVE Final    Comment: (NOTE) The Xpert Xpress SARS-CoV-2/FLU/RSV assay is intended as an aid in  the diagnosis of influenza from Nasopharyngeal swab specimens and  should not be used as a sole basis for treatment. Nasal washings and  aspirates are unacceptable for Xpert Xpress SARS-CoV-2/FLU/RSV  testing.  Fact Sheet for Patients: PinkCheek.be  Fact Sheet for Healthcare Providers: GravelBags.it  This test is not yet approved or cleared by the [place] FDA and  has been authorized for detection and/or diagnosis of SARS-CoV-2 by  FDA under an Emergency Use Authorization (EUA). This EUA will remain  in effect (meaning this test can be used) for the duration of the  Covid-19 declaration under Section 564(b)(1) of the Act, 21  U.S.C. section 360bbb-3(b)(1), unless the authorization is  terminated or revoked. Performed at [facility], [place] [address], [place], [place] [address]   Urine Culture     Status: Abnormal   Collection Time: 09/18/20 11:37 AM   Specimen: Urine, Random  Result Value Ref Range Status    Specimen Description URINE, RANDOM  Final   Special Requests NONE  Final   Culture (A)  Final    <10,000 COLONIES/mL INSIGNIFICANT GROWTH Performed at [facility], [place] [address], [place], [place] [address]    Report Status 09/19/2020 FINAL  Final  Gastrointestinal Panel by PCR , Stool     Status: Abnormal   Collection Time: 09/19/20  4:19 PM   Specimen: Stool  Result Value Ref Range Status   Campylobacter species NOT DETECTED NOT DETECTED Final   Plesimonas shigelloides NOT DETECTED NOT DETECTED Final   Salmonella species NOT DETECTED NOT DETECTED Final   Yersinia enterocolitica DETECTED (A) NOT DETECTED Final    Comment: RESULT CALLED TO, READ BACK BY AND VERIFIED WITH: Arta Silence $RemoveBefo'@1628'CiSXwCiCWUq$  09/20/20 MJU    Vibrio species NOT DETECTED NOT DETECTED Final   Vibrio cholerae NOT DETECTED NOT DETECTED Final   Enteroaggregative E coli (EAEC) NOT DETECTED NOT DETECTED Final   Enteropathogenic E coli (EPEC) NOT DETECTED NOT DETECTED Final   Enterotoxigenic E coli (ETEC) NOT DETECTED NOT DETECTED Final   Shiga like toxin producing E coli (STEC) NOT DETECTED NOT DETECTED Final   Shigella/Enteroinvasive E coli (EIEC) NOT DETECTED NOT DETECTED Final   Cryptosporidium NOT DETECTED NOT DETECTED Final   Cyclospora cayetanensis NOT DETECTED NOT DETECTED Final   Entamoeba histolytica NOT DETECTED NOT DETECTED Final   Giardia lamblia NOT DETECTED NOT DETECTED Final   Adenovirus F40/41 NOT DETECTED NOT DETECTED Final   Astrovirus NOT DETECTED NOT DETECTED Final   Norovirus GI/GII NOT DETECTED NOT DETECTED Final   Rotavirus A NOT DETECTED NOT DETECTED Final   Sapovirus (I, II, IV, and V) NOT DETECTED NOT DETECTED Final    Comment: Performed at [facility], [address], [place], [place] [address]         Radiology Studies: DG CHEST PORT 1 VIEW  Result Date: 09/21/2020 CLINICAL DATA:  Weakness. EXAM: PORTABLE CHEST 1 VIEW COMPARISON:  03/23/2016. FINDINGS: Mediastinum hilar  structures normal. Stable cardiomegaly. No pulmonary venous congestion. No focal infiltrate. No pleural effusion or pneumothorax. No acute bony abnormality. IMPRESSION: Stable cardiomegaly. No pulmonary venous congestion. No acute pulmonary disease identified. Electronically Signed   By: Marcello Moores  Register   On: 09/21/2020 08:56        Scheduled Meds: . doxazosin  1 mg Oral QHS  . doxycycline  100 mg Oral Q12H  . heparin  5,000 Units Subcutaneous Q8H  . insulin aspart  0-9 Units Subcutaneous TID WC  . nystatin  5 mL Oral QID  . rosuvastatin  5 mg Oral QHS   Continuous Infusions: . sodium chloride 100 mL/hr at 09/21/20 0800     LOS: 3 days    Time spent: 35 minutes.     Elmarie Shiley, MD [facility]   If 7PM-7AM, please contact night-coverage [email or web address]  09/21/2020, 1:42 PM

## 2020-09-22 ENCOUNTER — Inpatient Hospital Stay (HOSPITAL_COMMUNITY): Payer: Medicare Other

## 2020-09-22 DIAGNOSIS — A046 Enteritis due to Yersinia enterocolitica: Secondary | ICD-10-CM

## 2020-09-22 DIAGNOSIS — R609 Edema, unspecified: Secondary | ICD-10-CM

## 2020-09-22 DIAGNOSIS — R52 Pain, unspecified: Secondary | ICD-10-CM | POA: Diagnosis not present

## 2020-09-22 DIAGNOSIS — R197 Diarrhea, unspecified: Secondary | ICD-10-CM | POA: Diagnosis not present

## 2020-09-22 DIAGNOSIS — N189 Chronic kidney disease, unspecified: Secondary | ICD-10-CM | POA: Diagnosis not present

## 2020-09-22 DIAGNOSIS — N179 Acute kidney failure, unspecified: Secondary | ICD-10-CM | POA: Diagnosis not present

## 2020-09-22 LAB — CBC
HCT: 27.7 % — ABNORMAL LOW (ref 36.0–46.0)
Hemoglobin: 9.1 g/dL — ABNORMAL LOW (ref 12.0–15.0)
MCH: 30.1 pg (ref 26.0–34.0)
MCHC: 32.9 g/dL (ref 30.0–36.0)
MCV: 91.7 fL (ref 80.0–100.0)
Platelets: 187 10*3/uL (ref 150–400)
RBC: 3.02 MIL/uL — ABNORMAL LOW (ref 3.87–5.11)
RDW: 14.4 % (ref 11.5–15.5)
WBC: 8.3 10*3/uL (ref 4.0–10.5)
nRBC: 0 % (ref 0.0–0.2)

## 2020-09-22 LAB — DIC (DISSEMINATED INTRAVASCULAR COAGULATION)PANEL
D-Dimer, Quant: 3.64 ug/mL-FEU — ABNORMAL HIGH (ref 0.00–0.50)
Fibrinogen: 568 mg/dL — ABNORMAL HIGH (ref 210–475)
INR: 1.1 (ref 0.8–1.2)
Platelets: 178 10*3/uL (ref 150–400)
Prothrombin Time: 14.2 seconds (ref 11.4–15.2)
Smear Review: NONE SEEN
aPTT: 33 seconds (ref 24–36)

## 2020-09-22 LAB — CULTURE, BLOOD (ROUTINE X 2)
Culture: NO GROWTH
Culture: NO GROWTH
Special Requests: ADEQUATE

## 2020-09-22 LAB — BASIC METABOLIC PANEL
Anion gap: 10 (ref 5–15)
BUN: 15 mg/dL (ref 8–23)
CO2: 20 mmol/L — ABNORMAL LOW (ref 22–32)
Calcium: 8.1 mg/dL — ABNORMAL LOW (ref 8.9–10.3)
Chloride: 108 mmol/L (ref 98–111)
Creatinine, Ser: 0.88 mg/dL (ref 0.44–1.00)
GFR calc non Af Amer: >60 mL/min (ref >60)
Glucose, Bld: 139 mg/dL — ABNORMAL HIGH (ref 70–99)
Potassium: 3.7 mmol/L (ref 3.5–5.1)
Sodium: 138 mmol/L (ref 135–145)

## 2020-09-22 LAB — GLUCOSE, CAPILLARY
Glucose-Capillary: 133 mg/dL — ABNORMAL HIGH (ref 70–99)
Glucose-Capillary: 130 mg/dL — ABNORMAL HIGH (ref 70–99)
Glucose-Capillary: 95 mg/dL (ref 70–99)
Glucose-Capillary: 90 mg/dL (ref 70–99)

## 2020-09-22 MED ORDER — ENOXAPARIN SODIUM 80 MG/0.8ML ~~LOC~~ SOLN
70.0000 mg | Freq: Two times a day (BID) | SUBCUTANEOUS | Status: DC
  Administered 2020-09-22 – 2020-09-26 (×8): 70 mg via SUBCUTANEOUS
  Filled 2020-09-22 (×8): qty 0.8

## 2020-09-22 MED ORDER — BISMUTH SUBSALICYLATE 262 MG PO CHEW
524.0000 mg | CHEWABLE_TABLET | Freq: Three times a day (TID) | ORAL | Status: DC
  Administered 2020-09-22 – 2020-09-25 (×9): 524 mg via ORAL
  Filled 2020-09-22 (×12): qty 2

## 2020-09-22 MED ORDER — IOHEXOL 350 MG/ML SOLN
53.0000 mL | Freq: Once | INTRAVENOUS | Status: AC | PRN
  Administered 2020-09-22: 53 mL via INTRAVENOUS

## 2020-09-22 MED ORDER — METOPROLOL TARTRATE 25 MG PO TABS
25.0000 mg | ORAL_TABLET | Freq: Two times a day (BID) | ORAL | Status: DC
  Administered 2020-09-22 – 2020-09-26 (×9): 25 mg via ORAL
  Filled 2020-09-22 (×9): qty 1

## 2020-09-22 NOTE — Consult Note (Signed)
   [facility] CM Inpatient Consult   09/22/2020  Anna Stone [facility] [place] 09-08-1941 [id number]   [employer] [ACO] Patient: Medicare NextGen   Patient screened for less than 30 day readmission list to check if potential [place] Management service needs.  Review of patient's medical record reveals patient is currently being recommended for a skilled nursing facility for transition from the hospital.  Primary Care Provider is Marco Collie, MD this provider is listed to provide the transition of care [TOC] for post hospital follow up. Came by room on rounds and patient was having patient care provided.  Plan:  Continue to follow progress and disposition to assess for post hospital care management needs.  If patient transitions to a [facility] affiliated facility , will notify the [facility] RN PAC of dispositon and needs.  Please place a [facility] Care Management consult as appropriate and for questions contact:   Natividad Brood, RN BSN [facility] Liaison  [phone] business mobile phone Toll free office [phone]  Fax number: [phone] [email or web address]

## 2020-09-22 NOTE — TOC Progression Note (Signed)
Transition of Care [facility]) - Progression Note    Patient Details  Name: Analeya Luallen MRN: [id number] Date of Birth: Feb 18, 1941  Transition of Care [facility]) CM/SW Contact  Sharlet Salina Mila Homer, LCSW Phone Number: 09/22/2020, 5:12 PM  Clinical Narrative:   Talked with Olivia Mackie, admissions director for [facility] (3:40 om). She was informed that patient not ready for discharge today, possibly Saturday. Olivia Mackie asked to be contacted on Saturday to inform her if patient will or will not discharge over the weekend. If she does discharges on Saturday, please call her [phone]) and fax discharge summary to [phone]. Call report: [phone], ext. [phone] and patient will go to room 704.     Expected Discharge Plan: Skilled Nursing Facility Barriers to Discharge: SNF Pending bed offer (Initiating facility search today)  Expected Discharge Plan and Services Expected Discharge Plan: [place] In-house Referral: Clinical Social Work   Post Acute Care Choice: [place] Living arrangements for the past 2 months: Single Family Home                                     Social Determinants of Health (SDOH) Interventions  None requested or needed at this time.  Readmission Risk Interventions No flowsheet data found.

## 2020-09-22 NOTE — Progress Notes (Signed)
PROGRESS NOTE    Anna Stone  ZOX:[id number] DOB: [birth year] DOA: 09/17/2020 PCP: Marco Collie, MD   Brief Narrative: 79 year old with past medical history significant for diabetes, CKD stage IIIa, hypertension, hyperlipidemia who presents to the emergency room for evaluation of generalized weakness.  Patient was recently admitted in September for management of sepsis due to sigmoid colitis, she was also found to have AKI at that time was treated with IV antibiotics and fluids.  She was recommended a skilled nursing facility on discharge but she did declined and went home.  Since she has been at home she been feeling weak, unable to ambulate.  She also complained of diarrhea and decreased oral intake. On admission she was found to have AKI, started on IV fluids.  PT OT recommending skilled nursing facility.  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 She was started on Doxycycline. Patient continue to have poor oral intake.    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 Creatinine baseline 1.2-1.4 Continue to hold diuretics. Metabolic acidosis, likely related to diarrhea. Renal function improved.   2-Generalized weakness: Failure to thrive: PT/OT recommending skilled nursing facility   3-Diarrhea: Acute on chronic: Recently  treated for colitis. Lactoferrin positive GI; growing yersinia enterocolitica.  Started on Doxycycline, day 3 No significant improvement, complaining of diarrhea and nausea. GI consulted.   4-Hyperkalemia;  Resolved.   5-Rash LE, purpuric  ESR elevated  , C ANCA, ANA< negative.  No thrombocytopenia.  DIC negative.   Elevated D dimer; Tachycardia;  Will start lovenox.  Doppler positive for DVT  CT angio negative for PE> full report to follow  Hypotension;  BP on hold initially.  HTN; start metoprolol   DM; type 2; SSI  Left adrenal mass; Needs follow up.  Hypomagnesemia; replete  Estimated body mass index is 29.56 kg/m as calculated from the following:   Height as of this encounter: _0  (1.575 m).   Weight as of this encounter: 73.3 kg.   DVT prophylaxis: Heparin Code Status: DNR Family Communication: Care discussed with patient , husband 10/08 Disposition Plan:  Status is: Inpatient  Remains inpatient appropriate because:Ongoing diagnostic testing needed not appropriate for outpatient work up   Dispo: The patient is from: Home              Anticipated d/c is to: SNF              Anticipated d/c date is: 2 days              Patient currently is not medically stable to d/c.     Consultants:   None  Procedures:   None  Antimicrobials:    Subjective: She continue to have diarrhea, report nausea. Not eating.   Objective: Vitals:   09/22/20 0421 09/22/20 0500 09/22/20 0829 09/22/20 1230  BP: (!) 149/71  135/71 (!) 169/77  Pulse: (!) 127  (!) 118 (!) 118  Resp:   18 18  Temp: 98.3 F (36.8 C)  97.9 F (36.6 C) 98.1 F (36.7 C)  TempSrc: Oral  Oral Oral  SpO2: 97%  95% 95%  Weight:  73.3 kg    Height:        Intake/Output Summary (Last 24 hours) at 09/22/2020 1508 Last data filed at 09/22/2020 1300 Gross per 24 hour  Intake 2451.82 ml  Output 550 ml  Net 1901.82 ml   Filed Weights   09/19/20 0500 09/21/20 0349 09/22/20 0500  Weight: 74.9 kg 69 kg 73.3 kg    Examination:  General exam: NAD Respiratory system: S 1, S 2 RRR Cardiovascular system: BS present, soft,  nt Gastrointestinal system: BS present, soft, nt Central nervous system: Alert, following command Extremities:  B/L  purpuric rash.     Data Reviewed: I have personally reviewed following labs and imaging studies  CBC: Recent Labs  Lab 09/17/20 1520 09/18/20 0427 09/21/20 0145 09/22/20 0407  WBC 7.3 6.6 7.9 8.3  HGB 11.3* 10.6* 9.2* 9.1*  HCT 35.7* 31.9* 28.3* 27.7*  MCV 94.7 94.7 92.5 91.7  PLT 199 205 162 178   361   Basic Metabolic Panel: Recent Labs  Lab 09/17/20 1520 09/17/20 2318 09/18/20 0427 09/19/20 0438 09/20/20 0055 09/21/20 0145 09/22/20 0407  NA   < >  --  134* 136 135 137 138  K   < >  --  4.6 5.5* 4.5 4.3 3.7  CL   < >  --  97* 97* 102 106 108  CO2   < >  --  _0 17* 20*  GLUCOSE   < >  --  98 125* 113* 128* 139*  BUN   < >  --  _1 CREATININE   < >  --  2.47* 1.56* 1.20* 1.11* 0.88  CALCIUM   < >  --  8.4* 9.0 8.2* 8.1* 8.1*  MG  --  1.4*  --   --   --   --   --    < > = values in this interval not displayed.   GFR: Estimated Creatinine Clearance: 47 mL/min (by C-G formula based on SCr of 0.88 mg/dL). Liver Function Tests: Recent Labs  Lab 09/21/20 0145  AST 14*  ALT 15  ALKPHOS 38  BILITOT 1.1  PROT 5.0*  ALBUMIN 2.3*   No results for input(s): LIPASE, AMYLASE in the last 168 hours. No results for input(s): AMMONIA in the last 168 hours. Coagulation Profile: Recent Labs  Lab 09/22/20 0407  INR 1.1   Cardiac Enzymes: No results for input(s): CKTOTAL, CKMB, CKMBINDEX, TROPONINI in the last 168 hours. BNP (last 3 results) No results for input(s): PROBNP in the last 8760 hours. HbA1C: No results for input(s): HGBA1C in the last 72 hours. CBG: Recent Labs  Lab 09/21/20 1139 09/21/20 1647 09/21/20 2053 09/22/20 0650 09/22/20 1123  GLUCAP 98 101* 212* 133* 130*   Lipid Profile: No results for input(s): CHOL, HDL, LDLCALC, TRIG, CHOLHDL, LDLDIRECT in the last 72 hours. Thyroid Function Tests: No results for input(s): TSH, T4TOTAL, FREET4, T3FREE, THYROIDAB in the last 72 hours. Anemia Panel: No results for input(s):  VITAMINB12, FOLATE, FERRITIN, TIBC, IRON, RETICCTPCT in the last 72 hours. Sepsis Labs: Recent Labs  Lab 09/17/20 1937  LATICACIDVEN 1.1    Recent Results (from the past 240 hour(s))  Blood culture (routine x 2)     Status: None   Collection Time: 09/17/20 11:20 PM   Specimen: BLOOD  Result Value Ref Range Status   Specimen Description BLOOD RIGHT ANTECUBITAL  Final   Special Requests   Final  BOTTLES DRAWN AEROBIC AND ANAEROBIC Blood Culture results may not be optimal due to an inadequate volume of blood received in culture bottles   Culture   Final    NO GROWTH 5 DAYS Performed at [facility], [place] [address], [place], [place] [address]    Report Status 09/22/2020 FINAL  Final  Blood culture (routine x 2)     Status: None   Collection Time: 09/17/20 11:20 PM   Specimen: BLOOD RIGHT HAND  Result Value Ref Range Status   Specimen Description BLOOD RIGHT HAND  Final   Special Requests   Final    BOTTLES DRAWN AEROBIC AND ANAEROBIC Blood Culture adequate volume   Culture   Final    NO GROWTH 5 DAYS Performed at [facility], [place] [address] [place], [place] [address]    Report Status 09/22/2020 FINAL  Final  Respiratory Panel by RT PCR (Flu A&B, Covid) - Nasopharyngeal Swab     Status: None   Collection Time: 09/17/20 11:31 PM   Specimen: Nasopharyngeal Swab  Result Value Ref Range Status   SARS Coronavirus 2 by RT PCR NEGATIVE NEGATIVE Final    Comment: (NOTE) SARS-CoV-2 target nucleic acids are NOT DETECTED.  The SARS-CoV-2 RNA is generally detectable in upper respiratoy specimens during the acute phase of infection. The lowest concentration of SARS-CoV-2 viral copies this assay can detect is 131 copies/mL. A negative result does not preclude SARS-Cov-2 infection and should not be used as the sole basis for treatment or other patient management decisions. A negative result may occur with  improper specimen collection/handling, submission of specimen  other than nasopharyngeal swab, presence of viral mutation(s) within the areas targeted by this assay, and inadequate number of viral copies (<131 copies/mL). A negative result must be combined with clinical observations, patient history, and epidemiological information. The expected result is Negative.  Fact Sheet for Patients:  PinkCheek.be  Fact Sheet for Healthcare Providers:  GravelBags.it  This test is no t yet approved or cleared by the [place] FDA and  has been authorized for detection and/or diagnosis of SARS-CoV-2 by FDA under an Emergency Use Authorization (EUA). This EUA will remain  in effect (meaning this test can be used) for the duration of the COVID-19 declaration under Section 564(b)(1) of the Act, 21 U.S.C. section 360bbb-3(b)(1), unless the authorization is terminated or revoked sooner.     Influenza A by PCR NEGATIVE NEGATIVE Final   Influenza B by PCR NEGATIVE NEGATIVE Final    Comment: (NOTE) The Xpert Xpress SARS-CoV-2/FLU/RSV assay is intended as an aid in  the diagnosis of influenza from Nasopharyngeal swab specimens and  should not be used as a sole basis for treatment. Nasal washings and  aspirates are unacceptable for Xpert Xpress SARS-CoV-2/FLU/RSV  testing.  Fact Sheet for Patients: PinkCheek.be  Fact Sheet for Healthcare Providers: GravelBags.it  This test is not yet approved or cleared by the [place] FDA and  has been authorized for detection and/or diagnosis of SARS-CoV-2 by  FDA under an Emergency Use Authorization (EUA). This EUA will remain  in effect (meaning this test can be used) for the duration of the  Covid-19 declaration under Section 564(b)(1) of the Act, 21  U.S.C. section 360bbb-3(b)(1), unless the authorization is  terminated or revoked. Performed at [facility], [place] [address],  [place], [place] [address]   Urine Culture     Status: Abnormal   Collection Time: 09/18/20 11:37 AM   Specimen:  Urine, Random  Result Value Ref Range Status   Specimen Description URINE, RANDOM  Final   Special Requests NONE  Final   Culture (A)  Final    <10,000 COLONIES/mL INSIGNIFICANT GROWTH Performed at [facility], [address], [place], [place] [address]    Report Status 09/19/2020 FINAL  Final  Gastrointestinal Panel by PCR , Stool     Status: Abnormal   Collection Time: 09/19/20  4:19 PM   Specimen: Stool  Result Value Ref Range Status   Campylobacter species NOT DETECTED NOT DETECTED Final   Plesimonas shigelloides NOT DETECTED NOT DETECTED Final   Salmonella species NOT DETECTED NOT DETECTED Final   Yersinia enterocolitica DETECTED (A) NOT DETECTED Final    Comment: RESULT CALLED TO, READ BACK BY AND VERIFIED WITH: YARI VALDIVIA _0  09/20/20 MJU    Vibrio species NOT DETECTED NOT DETECTED Final   Vibrio cholerae NOT DETECTED NOT DETECTED Final   Enteroaggregative E coli (EAEC) NOT DETECTED NOT DETECTED Final   Enteropathogenic E coli (EPEC) NOT DETECTED NOT DETECTED Final   Enterotoxigenic E coli (ETEC) NOT DETECTED NOT DETECTED Final   Shiga like toxin producing E coli (STEC) NOT DETECTED NOT DETECTED Final   Shigella/Enteroinvasive E coli (EIEC) NOT DETECTED NOT DETECTED Final   Cryptosporidium NOT DETECTED NOT DETECTED Final   Cyclospora cayetanensis NOT DETECTED NOT DETECTED Final   Entamoeba histolytica NOT DETECTED NOT DETECTED Final   Giardia lamblia NOT DETECTED NOT DETECTED Final   Adenovirus F40/41 NOT DETECTED NOT DETECTED Final   Astrovirus NOT DETECTED NOT DETECTED Final   Norovirus GI/GII NOT DETECTED NOT DETECTED Final   Rotavirus A NOT DETECTED NOT DETECTED Final   Sapovirus (I, II, IV, and V) NOT DETECTED NOT DETECTED Final    Comment: Performed at [facility], [address], [place], [place] [address]         Radiology  Studies: DG CHEST PORT 1 VIEW  Result Date: 09/21/2020 CLINICAL DATA:  Weakness. EXAM: PORTABLE CHEST 1 VIEW COMPARISON:  03/23/2016. FINDINGS: Mediastinum hilar structures normal. Stable cardiomegaly. No pulmonary venous congestion. No focal infiltrate. No pleural effusion or pneumothorax. No acute bony abnormality. IMPRESSION: Stable cardiomegaly. No pulmonary venous congestion. No acute pulmonary disease identified. Electronically Signed   By: Marcello Moores  Register   On: 09/21/2020 08:56        Scheduled Meds:  bismuth subsalicylate  222 mg Oral TID   doxazosin  1 mg Oral QHS   doxycycline  100 mg Oral Q12H   feeding supplement (ENSURE ENLIVE)  237 mL Oral BID BM   heparin  5,000 Units Subcutaneous Q8H   insulin aspart  0-9 Units Subcutaneous TID WC   nystatin  5 mL Oral QID   rosuvastatin  5 mg Oral QHS   Continuous Infusions:  sodium chloride 100 mL/hr at 09/21/20 2146     LOS: 4 days    Time spent: 35 minutes.     Elmarie Shiley, MD [facility]   If 7PM-7AM, please contact night-coverage [email or web address]  09/22/2020, 3:08 PM

## 2020-09-22 NOTE — Progress Notes (Signed)
Venous duplex lower ext  has been completed. Refer to [facility] under chart review to view preliminary results. Results given to patient's nurse, Renee.   09/22/2020  3:12 PM Randie Bloodgood, Bonnye Fava

## 2020-09-22 NOTE — Progress Notes (Signed)
ANTICOAGULATION CONSULT NOTE - Initial Consult  Pharmacy Consult for Lovenox Indication: DVT  Allergies  Allergen Reactions  . Sulfa Antibiotics Itching and Rash  . Adhesive [Tape] Dermatitis    Blisters the skin    Patient Measurements: Height: 5\' 2"  (157.5 cm) Weight: 73.3 kg (161 lb 9.6 oz) IBW/kg (Calculated) : 50.1  Vital Signs: Temp: 98.1 F (36.7 C) (10/08 1230) Temp Source: Oral (10/08 1230) BP: 169/77 (10/08 1230) Pulse Rate: 118 (10/08 1230)  Labs: Recent Labs    09/20/20 0055 09/21/20 0145 09/22/20 0407  HGB  --  9.2* 9.1*  HCT  --  28.3* 27.7*  PLT  --  162 178  187  APTT  --   --  33  LABPROT  --   --  14.2  INR  --   --  1.1  CREATININE 1.20* 1.11* 0.88    Estimated Creatinine Clearance: 47 mL/min (by C-G formula based on SCr of 0.88 mg/dL).   Medical History: Past Medical History:  Diagnosis Date  . Cancer ([place])   . Chronic kidney disease    stage 3  . Depression   . Diabetes mellitus without complication ([place])   . Difficulty breathing   . GERD (gastroesophageal reflux disease)   . Hypertension   . IBS (irritable bowel syndrome)   . Muscle pain   . Palpitation   . PONV (postoperative nausea and vomiting)   . Swelling     Medications:  Scheduled:  . bismuth subsalicylate  165 mg Oral TID  . doxazosin  1 mg Oral QHS  . doxycycline  100 mg Oral Q12H  . feeding supplement (ENSURE ENLIVE)  237 mL Oral BID BM  . heparin  5,000 Units Subcutaneous Q8H  . insulin aspart  0-9 Units Subcutaneous TID WC  . metoprolol tartrate  25 mg Oral BID  . nystatin  5 mL Oral QID  . rosuvastatin  5 mg Oral QHS    Assessment: 79 years of age female with LE venous ultrasound positive for age indeterminate DVT in left femoral, popliteal, posterior tibial, and peroneal veins. CT Angio is pending to evaluate for pulmonary embolism. Pharmacy consulted to start Lovenox therapy.   H/H trending down slightly. Platelets stable. No bleeding documented. D-dimer  3.64.   Goal of Therapy:  Anti-Xa level 0.6-1 units/ml 4hrs after LMWH dose given as appropriate Monitor platelets by anticoagulation protocol: Yes   Plan:  Discontinue subcutaneous Heparin.  Lovenox 70 mg subcutaneous every 12 hours.  Monitor CBC, renal function, and for any signs of bleeding.   Sloan Leiter, PharmD, BCPS, BCCCP Clinical Pharmacist Please refer to [facility] for [place] numbers 09/22/2020,3:31 PM

## 2020-09-22 NOTE — Care Management Important Message (Signed)
Important Message  Patient Details  Name: Anna Stone MRN: [id number] Date of Birth: October 28, 1941   Medicare Important Message Given:  Yes   Tried to call the patient in his room to make him aware of the IM did not get an answer will mail Document to the patient's home address   Orbie Pyo 09/22/2020, 3:32 PM

## 2020-09-22 NOTE — Consult Note (Addendum)
Consultation  Referring Provider:   Dr. Sherryl Manges Primary Care Physician:  Marco Collie, MD Primary Gastroenterologist: Althia Forts        Reason for Consultation: Diarrhea, nausea            HPI:   Anna Stone is a 79 y.o. female with a past medical history significant for type 2 diabetes, CKD stage III and others listed below including IBS, who presented to the hospital initially on 09/17/2020 for generalized weakness.  We are consulted in regards to ongoing diarrhea and nausea.    Interestingly patient was recently admitted 08/31/2020-09/03/2020 for sepsis due to sigmoid colitis and AKI.  She was treated with IV ceftriaxone/Flagyl and IV fluids.  C. difficile was negative.  Creatinine improved from 2.03-1.93 on day of discharge.  She was advised to discontinue HCTZ.  She was discharged home with home health that she refused to go to SNF.      At time of admission here patient describe that she had not been able to ambulate well even with the use of a walker due to generalized weakness at home since being discharged with decreased urine output.  Also described chronic alternating diarrhea and constipation and at that time was currently constipated.  She reported excessive fatigue.    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  She was started on Doxycycline 2 days ago.  Symptoms of diarrhea and nausea have not gotten any better.    Today, patient is found laying in the bed, they are trying to draw blood for blood cultures.  She tells me that prior to getting to the hospital she was actually constipated and could not remember the last time that she had had a bowel movement but shortly after her arrival here she started with at least 3-4 loose bowel movements a day and at least 1 or 2 during the evening which wake her from her sleep.  Tells me that along with this she has been nauseous and really unable to eat anything due to  this.  When they give her medicine for nausea it does help for a little while.  She denies any abdominal pain or previous episodes of diarrhea and tells me that she is typically chronically constipated at home.  She does not use any laxatives it just usually eventually comes after 4 to 5 days.  Tells me that she has been having some chills/ "shakes" at night.    Does tell me that she had a colonoscopy once in her life but this was "years ago".  She cannot remember where this was done.    Denies fever, vomiting, weight loss or blood in her stool.  ED course: BUN 19, creatinine 2.37 (previously 0.93 on 9/19), sodium 133, potassium 4.1, normal hemoglobin and WBCs.  CT abdomen pelvis without contrast showed mild hazy bibasilar atelectasis, stable hepatic cysts, stable 9 mm left adrenal mass, large stool burden and aortic atherosclerosis.  Past Medical History:  Diagnosis Date  . Cancer ([place])   . Chronic kidney disease    stage 3  . Depression   . Diabetes mellitus without complication ([place])   . Difficulty breathing   . GERD (gastroesophageal reflux disease)   . Hypertension   . IBS (irritable bowel syndrome)   . Muscle pain   . Palpitation   . PONV (postoperative nausea and vomiting)   . Swelling     Past Surgical History:  Procedure  Laterality Date  . ABDOMINAL HYSTERECTOMY    . ANKLE FRACTURE SURGERY Right   . BREAST SURGERY     right  . CHOLECYSTECTOMY    . DECOMPRESSIVE LUMBAR LAMINECTOMY LEVEL 1 Right 10/30/2016   Procedure: RIGHT L5-S1 DECOMPRESSION, EXCISION OF CYST LEVEL 1;  Surgeon: Melina Schools, MD;  Location: [facility];  Service: Orthopedics;  Laterality: Right;  . OVARY SURGERY      Family History: Denies family history of GI cancer  Social History   Tobacco Use  . Smoking status: Never Smoker  . Smokeless tobacco: Never Used  Substance Use Topics  . Alcohol use: No  . Drug use: No    Prior to Admission medications   Medication Sig Start Date End Date Taking?  Authorizing Provider  amLODipine (NORVASC) 10 MG tablet Take 10 mg by mouth daily.   Yes [provider]  doxazosin (CARDURA) 1 MG tablet Take 1 mg by mouth at bedtime. 08/24/20  Yes [provider]  fluticasone (FLONASE) 50 MCG/ACT nasal spray Place 2 sprays into both nostrils daily as needed for allergies.  08/07/20  Yes [provider]  furosemide (LASIX) 20 MG tablet Take 20 mg by mouth daily.    Yes [provider]  gabapentin (NEURONTIN) 600 MG tablet Take 600 mg by mouth 2 (two) times daily.    Yes [provider]  losartan (COZAAR) 25 MG tablet Take 25 mg by mouth daily. 09/14/20  Yes [provider]  omeprazole (PRILOSEC) 20 MG capsule Take 40 mg by mouth daily.    Yes [provider]  rOPINIRole (REQUIP) 2 MG tablet Take 2 mg by mouth at bedtime.   Yes [provider]  rosuvastatin (CRESTOR) 5 MG tablet Take 5 mg by mouth at bedtime.    Yes [provider]  spironolactone (ALDACTONE) 50 MG tablet Take 50 mg by mouth daily. 07/07/20  Yes [provider]  lisinopril (ZESTRIL) 20 MG tablet Take 1 tablet (20 mg total) by mouth daily. Patient not taking: Reported on 09/18/2020 09/04/20   Oswald Hillock, MD    Current Facility-Administered Medications  Medication Dose Route Frequency Provider Last Rate Last Admin  . 0.9 %  sodium chloride infusion   Intravenous Continuous Regalado, Belkys A, MD 100 mL/hr at 09/21/20 2146 New Bag at 09/21/20 2146  . acetaminophen (TYLENOL) tablet 650 mg  650 mg Oral Q6H PRN Lenore Cordia, MD   650 mg at 09/21/20 1115   Or  . acetaminophen (TYLENOL) suppository 650 mg  650 mg Rectal Q6H PRN Zada Finders R, MD      . doxazosin (CARDURA) tablet 1 mg  1 mg Oral QHS Shelly Coss, MD   1 mg at 09/21/20 2144  . doxycycline (VIBRA-TABS) tablet 100 mg  100 mg Oral Q12H Regalado, Belkys A, MD   100 mg at 09/22/20 0839  . feeding supplement (ENSURE ENLIVE) (ENSURE ENLIVE) liquid  237 mL  237 mL Oral BID BM Regalado, Belkys A, MD   237 mL at 09/22/20 1100  . heparin injection 5,000 Units  5,000 Units Subcutaneous Q8H Lenore Cordia, MD   5,000 Units at 09/22/20 [phone]  . insulin aspart (novoLOG) injection 0-9 Units  0-9 Units Subcutaneous TID WC Zada Finders R, MD   1 Units at 09/22/20 1129  . nystatin (MYCOSTATIN) 100000 UNIT/ML suspension 500,000 Units  5 mL Oral QID Regalado, Belkys A, MD   500,000 Units at 09/22/20 0839  . ondansetron (ZOFRAN) tablet 4  mg  4 mg Oral Q6H PRN Lenore Cordia, MD       Or  . ondansetron (ZOFRAN) injection 4 mg  4 mg Intravenous Q6H PRN Lenore Cordia, MD   4 mg at 09/22/20 1123  . rosuvastatin (CRESTOR) tablet 5 mg  5 mg Oral QHS Zada Finders R, MD   5 mg at 09/21/20 2144    Allergies as of 09/17/2020 - Review Complete 09/17/2020  Allergen Reaction Noted  . Sulfa antibiotics Itching and Rash 01/26/2014  . Adhesive [tape] Dermatitis 10/18/2016     Review of Systems:    Constitutional: No weight loss or fever Skin: No rash  Cardiovascular: No chest pain  Respiratory: No SOB  Gastrointestinal: See HPI and otherwise negative Genitourinary: No dysuria Neurological: No headache, dizziness or syncope Musculoskeletal: No new muscle or joint pain Hematologic: No bleeding  Psychiatric: No history of depression or anxiety    Physical Exam:  Vital signs in last 24 hours: Temp:  [97.9 F (36.6 C)-98.6 F (37 C)] 98.1 F (36.7 C) (10/08 1230) Pulse Rate:  [118-128] 118 (10/08 1230) Resp:  [18-22] 18 (10/08 1230) BP: (135-170)/(56-77) 169/77 (10/08 1230) SpO2:  [95 %-98 %] 95 % (10/08 1230) Weight:  [73.3 kg] 73.3 kg (10/08 0500) Last BM Date: 09/21/20 General:   Pleasant Caucasian female appears to be in NAD, Well developed, Well nourished, alert and cooperative Head:  Normocephalic and atraumatic. Eyes:   PEERL, EOMI. No icterus. Conjunctiva pink. Ears:  Normal auditory acuity. Neck:  Supple Throat: Oral cavity and pharynx  without inflammation, swelling or lesion. Lungs: Respirations even and unlabored. Lungs clear to auscultation bilaterally.   No wheezes, crackles, or rhonchi.  Heart: Normal S1, S2. No MRG. Regular rate and rhythm. No peripheral edema, cyanosis or pallor.  Abdomen:  Soft, nondistended, nontender. No rebound or guarding. Normal bowel sounds. No appreciable masses or hepatomegaly. Rectal:  Not performed.  Msk:  Symmetrical without gross deformities. Peripheral pulses intact.  Extremities:  Without edema, no deformity or joint abnormality.  Neurologic:  Alert and  oriented x4;  grossly normal neurologically. Skin:   Dry and intact. +bruising over extremitis Psychiatric: Demonstrates good judgement and reason without abnormal affect or behaviors.   LAB RESULTS: Recent Labs    09/21/20 0145 09/22/20 0407  WBC 7.9 8.3  HGB 9.2* 9.1*  HCT 28.3* 27.7*  PLT 162 178  187   BMET Recent Labs    09/20/20 0055 09/21/20 0145 09/22/20 0407  NA 135 137 138  K 4.5 4.3 3.7  CL 102 106 108  CO2 23 17* 20*  GLUCOSE 113* 128* 139*  BUN 15 16 15   CREATININE 1.20* 1.11* 0.88  CALCIUM 8.2* 8.1* 8.1*   LFT Recent Labs    09/21/20 0145  PROT 5.0*  ALBUMIN 2.3*  AST 14*  ALT 15  ALKPHOS 38  BILITOT 1.1   PT/INR Recent Labs    09/22/20 0407  LABPROT 14.2  INR 1.1    STUDIES: DG CHEST PORT 1 VIEW  Result Date: 09/21/2020 CLINICAL DATA:  Weakness. EXAM: PORTABLE CHEST 1 VIEW COMPARISON:  03/23/2016. FINDINGS: Mediastinum hilar structures normal. Stable cardiomegaly. No pulmonary venous congestion. No focal infiltrate. No pleural effusion or pneumothorax. No acute bony abnormality. IMPRESSION: Stable cardiomegaly. No pulmonary venous congestion. No acute pulmonary disease identified. Electronically Signed   By: Marcello Moores  Register   On: 09/21/2020 08:56     Impression / Plan:   Impression: 1.  Diarrhea: 09/19/2020 fecal lactoferrin positive  and gastrointestinal panel positive for  Yersinia enterocolitica, patient started on doxycycline 09/20/2020, continues with 3-4 loose bowel movements in the day and 1-2 at night which wake her from sleep, no abdominal pain or bleeding, history of chronic constipation; consider relation to known bacteria versus IBS versus other 2.  Nausea: Likely related to above 3.  AKI on CKD stage III 4.  Generalized weakness/deconditioning 5.  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  Lower extremity deep purpuric rash  Plan: 1.  Consider diarrhea most likely still from known pathogen Yersinia Enterocolitica, may need to just wait for resolution of symptoms, can last 12-22 days per CDC. She is not having extreme diarrhea at this point. UpToDate recommends 5 days of antibiotics if giving them. 2. Started Pepto Bismal TID 3. Please await further recommendations from Dr. Havery Moros later today.  Thank you for your kind consultation, we will continue to follow.  Anna Stone  09/22/2020, 1:27 PM

## 2020-09-23 DIAGNOSIS — N189 Chronic kidney disease, unspecified: Secondary | ICD-10-CM | POA: Diagnosis not present

## 2020-09-23 DIAGNOSIS — A046 Enteritis due to Yersinia enterocolitica: Secondary | ICD-10-CM | POA: Diagnosis not present

## 2020-09-23 DIAGNOSIS — R197 Diarrhea, unspecified: Secondary | ICD-10-CM | POA: Diagnosis not present

## 2020-09-23 DIAGNOSIS — N179 Acute kidney failure, unspecified: Secondary | ICD-10-CM | POA: Diagnosis not present

## 2020-09-23 LAB — BASIC METABOLIC PANEL
Anion gap: 11 (ref 5–15)
BUN: 9 mg/dL (ref 8–23)
CO2: 18 mmol/L — ABNORMAL LOW (ref 22–32)
Calcium: 8.1 mg/dL — ABNORMAL LOW (ref 8.9–10.3)
Chloride: 110 mmol/L (ref 98–111)
Creatinine, Ser: 0.85 mg/dL (ref 0.44–1.00)
GFR, Estimated: >60 mL/min (ref >60)
Glucose, Bld: 87 mg/dL (ref 70–99)
Potassium: 3.8 mmol/L (ref 3.5–5.1)
Sodium: 139 mmol/L (ref 135–145)

## 2020-09-23 LAB — GLUCOSE, CAPILLARY
Glucose-Capillary: 77 mg/dL (ref 70–99)
Glucose-Capillary: 138 mg/dL — ABNORMAL HIGH (ref 70–99)
Glucose-Capillary: 98 mg/dL (ref 70–99)
Glucose-Capillary: 93 mg/dL (ref 70–99)

## 2020-09-23 LAB — CBC
HCT: 29 % — ABNORMAL LOW (ref 36.0–46.0)
Hemoglobin: 9.5 g/dL — ABNORMAL LOW (ref 12.0–15.0)
MCH: 30.3 pg (ref 26.0–34.0)
MCHC: 32.8 g/dL (ref 30.0–36.0)
MCV: 92.4 fL (ref 80.0–100.0)
Platelets: 193 10*3/uL (ref 150–400)
RBC: 3.14 MIL/uL — ABNORMAL LOW (ref 3.87–5.11)
RDW: 14.6 % (ref 11.5–15.5)
WBC: 7.9 10*3/uL (ref 4.0–10.5)
nRBC: 0.3 % — ABNORMAL HIGH (ref 0.0–0.2)

## 2020-09-23 LAB — C DIFFICILE (CDIFF) QUICK SCRN (NO PCR REFLEX)
C Diff antigen: POSITIVE — AB
C Diff toxin: NEGATIVE

## 2020-09-23 LAB — MAGNESIUM: Magnesium: 1.1 mg/dL — ABNORMAL LOW (ref 1.7–2.4)

## 2020-09-23 MED ORDER — MAGNESIUM SULFATE 2 GM/50ML IV SOLN
2.0000 g | Freq: Once | INTRAVENOUS | Status: AC
  Administered 2020-09-23: 2 g via INTRAVENOUS
  Filled 2020-09-23: qty 50

## 2020-09-23 NOTE — Progress Notes (Signed)
PROGRESS NOTE    Anna Stone  XBM:[id number] DOB: [birth year] DOA: 09/17/2020 PCP: Marco Collie, MD   Brief Narrative: 79 year old with past medical history significant for diabetes, CKD stage IIIa, hypertension, hyperlipidemia who presents to the emergency room for evaluation of generalized weakness.  Patient was recently admitted in September for management of sepsis due to sigmoid colitis, she was also found to have AKI at that time was treated with IV antibiotics and fluids.  She was recommended a skilled nursing facility on discharge but she did declined and went home.  Since she has been at home she been feeling weak, unable to ambulate.  She also complained of diarrhea and decreased oral intake. On admission she was found to have AKI, started on IV fluids.  PT OT recommending skilled nursing facility.  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 She was started on Doxycycline. Patient continue to have poor oral intake.    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 Creatinine baseline 1.2-1.4 Continue to hold diuretics. Metabolic acidosis, likely related to diarrhea. Renal function improved.   2-Generalized weakness: Failure to thrive: PT/OT recommending skilled nursing facility   3-Diarrhea: Acute on chronic: Recently  treated for colitis. Lactoferrin positive GI; growing yersinia enterocolitica.  Started on Doxycycline, day 4/5 No significant improvement, complaining of diarrhea and nausea. GI  consulted.  Appreciate GI assistance, started on Pepto, which help.  Patient improved today.  C diff pending.   4-Hyperkalemia;  Resolved.   5-Rash LE, purpuric  ESR elevated , C ANCA, ANA< negative.  No thrombocytopenia.  DIC negative.  Might be related to GI infection.   Left LE DVT, femoral, popliteal, tibial and peroneal: Elevated D dimer; Tachycardia;  Continue with Lovenox. Plan to transition to eliquis in 1-2 days.  Doppler positive for DVT  CT angio negative for PE> full report to follow  Hypotension;  BP on hold initially.   HTN; started metoprolol   DM; type 2; SSI  Left adrenal mass; Needs follow up.  Hypomagnesemia; replete IV.   Estimated body mass index is 29.72 kg/m as calculated from the following:   Height as of this encounter: _0  (1.575 m).   Weight as of this encounter: 73.7 kg.   DVT prophylaxis: Heparin Code Status: DNR Family Communication: Care discussed with patient , husband 10/08, son 10-09 Disposition Plan:  Status is: Inpatient  Remains inpatient appropriate because:Ongoing diagnostic testing needed not appropriate for outpatient work up   Dispo: The patient is from: Home              Anticipated d/c is to: SNF              Anticipated d/c date is: 2 days              Patient currently is not medically stable to d/c.     Consultants:   None  Procedures:   None  Antimicrobials:    Subjective: She report improvement of diarrhea. She will try to eat some today.  Objective: Vitals:   09/23/20 0046 09/23/20 0426 09/23/20 1001 09/23/20 1300  BP: 138/67 (!) 140/59 (!) 145/73 132/63  Pulse: 100 (!) 103 (!) 116 96  Resp: _0 Temp: 97.9 F (36.6 C) 97.8 F (36.6 C) (!) 97.4 F (36.3 C) 99 F (37.2 C)  TempSrc: Oral Oral Oral Oral  SpO2: 94% 97% 95%   Weight:      Height:        Intake/Output Summary (Last 24 hours) at 09/23/2020 1537 Last data filed at 09/23/2020 1400 Gross per 24 hour  Intake  2262.07 ml  Output 825 ml  Net 1437.07 ml   Filed Weights   09/21/20 0349 09/22/20 0500 09/22/20 2031  Weight: 69 kg 73.3 kg 73.7 kg    Examination:  General exam: NAD Respiratory system: S 1, S 2 RRR Cardiovascular system: BS present, soft, nt Gastrointestinal system: BS present, soft, nt Central nervous system: alert, following command Extremities:  B/L purpuric rash.     Data Reviewed: I have personally reviewed following labs and imaging studies  CBC: Recent Labs  Lab 09/17/20 1520 09/18/20 0427 09/21/20 0145 09/22/20 0407 09/23/20 0212  WBC 7.3 6.6 7.9 8.3 7.9  HGB 11.3* 10.6* 9.2* 9.1* 9.5*  HCT 35.7* 31.9* 28.3* 27.7* 29.0*  MCV 94.7 94.7 92.5 91.7 92.4  PLT 199 205 162 178  187 158   Basic Metabolic Panel: Recent Labs  Lab 09/17/20 2318 09/18/20 0427 09/19/20 0438 09/20/20 0055 09/21/20 0145 09/22/20 0407 09/23/20 0212  NA  --    < > 136 135 137 138 139  K  --    < > 5.5* 4.5 4.3 3.7 3.8  CL  --    < > 97* 102 106 108 110  CO2  --    < > 27 23 17* 20* 18*  GLUCOSE  --    < > 125* 113* 128* 139* 87  BUN  --    < > _1 CREATININE  --    < > 1.56* 1.20* 1.11* 0.88 0.85  CALCIUM  --    < > 9.0 8.2* 8.1* 8.1* 8.1*  MG 1.4*  --   --   --   --   --  1.1*   < > = values in this interval not displayed.   GFR: Estimated Creatinine Clearance: 48.8 mL/min (by C-G formula based on SCr of 0.85 mg/dL). Liver Function Tests: Recent Labs  Lab 09/21/20 0145  AST 14*  ALT 15  ALKPHOS 38  BILITOT 1.1  PROT 5.0*  ALBUMIN 2.3*   No results for input(s): LIPASE, AMYLASE in the last 168 hours. No results for input(s): AMMONIA in the last 168 hours. Coagulation Profile: Recent Labs  Lab 09/22/20 0407  INR 1.1   Cardiac Enzymes: No results for input(s): CKTOTAL, CKMB, CKMBINDEX, TROPONINI in the last 168 hours. BNP (last 3 results) No results for input(s): PROBNP in the last 8760 hours. HbA1C: No results for input(s): HGBA1C in the last 72  hours. CBG: Recent Labs  Lab 09/22/20 1123 09/22/20 1558 09/22/20 2033 09/23/20 0632 09/23/20 1146  GLUCAP 130* 95 90 77 138*   Lipid Profile: No results for input(s): CHOL, HDL, LDLCALC, TRIG, CHOLHDL, LDLDIRECT in the last 72 hours. Thyroid Function Tests: No results for input(s): TSH, T4TOTAL, FREET4, T3FREE, THYROIDAB in the last 72 hours. Anemia Panel: No results for input(s): VITAMINB12, FOLATE, FERRITIN, TIBC, IRON, RETICCTPCT in the last 72 hours. Sepsis Labs:  Recent Labs  Lab 09/17/20 1937  LATICACIDVEN 1.1    Recent Results (from the past 240 hour(s))  Blood culture (routine x 2)     Status: None   Collection Time: 09/17/20 11:20 PM   Specimen: BLOOD  Result Value Ref Range Status   Specimen Description BLOOD RIGHT ANTECUBITAL  Final   Special Requests   Final    BOTTLES DRAWN AEROBIC AND ANAEROBIC Blood Culture results may not be optimal due to an inadequate volume of blood received in culture bottles   Culture   Final    NO GROWTH 5 DAYS Performed at [facility], [address], [place], [place] [address]    Report Status 09/22/2020 FINAL  Final  Blood culture (routine x 2)     Status: None   Collection Time: 09/17/20 11:20 PM   Specimen: BLOOD RIGHT HAND  Result Value Ref Range Status   Specimen Description BLOOD RIGHT HAND  Final   Special Requests   Final    BOTTLES DRAWN AEROBIC AND ANAEROBIC Blood Culture adequate volume   Culture   Final    NO GROWTH 5 DAYS Performed at [facility], [address], [place], [place] [address]    Report Status 09/22/2020 FINAL  Final  Respiratory Panel by RT PCR (Flu A&B, Covid) - Nasopharyngeal Swab     Status: None   Collection Time: 09/17/20 11:31 PM   Specimen: Nasopharyngeal Swab  Result Value Ref Range Status   SARS Coronavirus 2 by RT PCR NEGATIVE NEGATIVE Final    Comment: (NOTE) SARS-CoV-2 target nucleic acids are NOT DETECTED.  The SARS-CoV-2 RNA is generally detectable in upper  respiratoy specimens during the acute phase of infection. The lowest concentration of SARS-CoV-2 viral copies this assay can detect is 131 copies/mL. A negative result does not preclude SARS-Cov-2 infection and should not be used as the sole basis for treatment or other patient management decisions. A negative result may occur with  improper specimen collection/handling, submission of specimen other than nasopharyngeal swab, presence of viral mutation(s) within the areas targeted by this assay, and inadequate number of viral copies (<131 copies/mL). A negative result must be combined with clinical observations, patient history, and epidemiological information. The expected result is Negative.  Fact Sheet for Patients:  https://www.moore.com/  Fact Sheet for Healthcare Providers:  https://www.young.biz/  This test is no t yet approved or cleared by the [place] FDA and  has been authorized for detection and/or diagnosis of SARS-CoV-2 by FDA under an Emergency Use Authorization (EUA). This EUA will remain  in effect (meaning this test can be used) for the duration of the COVID-19 declaration under Section 564(b)(1) of the Act, 21 U.S.C. section 360bbb-3(b)(1), unless the authorization is terminated or revoked sooner.     Influenza A by PCR NEGATIVE NEGATIVE Final   Influenza B by PCR NEGATIVE NEGATIVE Final    Comment: (NOTE) The Xpert Xpress SARS-CoV-2/FLU/RSV assay is intended as an aid in  the diagnosis of influenza from Nasopharyngeal swab specimens and  should not be used as a sole basis for treatment. Nasal washings and  aspirates are unacceptable for Xpert Xpress SARS-CoV-2/FLU/RSV  testing.  Fact Sheet for Patients: https://www.moore.com/  Fact Sheet for Healthcare Providers: https://www.young.biz/  This test is not yet approved or cleared by the [place] FDA and  has been  authorized for detection and/or diagnosis of SARS-CoV-2 by  FDA under an Emergency Use Authorization (EUA). This EUA will remain  in  effect (meaning this test can be used) for the duration of the  Covid-19 declaration under Section 564(b)(1) of the Act, 21  U.S.C. section 360bbb-3(b)(1), unless the authorization is  terminated or revoked. Performed at [facility], [place] [address] [place], [place] [address]   Urine Culture     Status: Abnormal   Collection Time: 09/18/20 11:37 AM   Specimen: Urine, Random  Result Value Ref Range Status   Specimen Description URINE, RANDOM  Final   Special Requests NONE  Final   Culture (A)  Final    <10,000 COLONIES/mL INSIGNIFICANT GROWTH Performed at [facility], [place] [address] [place], [place] [address]    Report Status 09/19/2020 FINAL  Final  Gastrointestinal Panel by PCR , Stool     Status: Abnormal   Collection Time: 09/19/20  4:19 PM   Specimen: Stool  Result Value Ref Range Status   Campylobacter species NOT DETECTED NOT DETECTED Final   Plesimonas shigelloides NOT DETECTED NOT DETECTED Final   Salmonella species NOT DETECTED NOT DETECTED Final   Yersinia enterocolitica DETECTED (A) NOT DETECTED Final    Comment: RESULT CALLED TO, READ BACK BY AND VERIFIED WITH: YARI VALDIVIA _0  09/20/20 MJU    Vibrio species NOT DETECTED NOT DETECTED Final   Vibrio cholerae NOT DETECTED NOT DETECTED Final   Enteroaggregative E coli (EAEC) NOT DETECTED NOT DETECTED Final   Enteropathogenic E coli (EPEC) NOT DETECTED NOT DETECTED Final   Enterotoxigenic E coli (ETEC) NOT DETECTED NOT DETECTED Final   Shiga like toxin producing E coli (STEC) NOT DETECTED NOT DETECTED Final   Shigella/Enteroinvasive E coli (EIEC) NOT DETECTED NOT DETECTED Final   Cryptosporidium NOT DETECTED NOT DETECTED Final   Cyclospora cayetanensis NOT DETECTED NOT DETECTED Final   Entamoeba histolytica NOT DETECTED NOT DETECTED Final   Giardia lamblia NOT DETECTED  NOT DETECTED Final   Adenovirus F40/41 NOT DETECTED NOT DETECTED Final   Astrovirus NOT DETECTED NOT DETECTED Final   Norovirus GI/GII NOT DETECTED NOT DETECTED Final   Rotavirus A NOT DETECTED NOT DETECTED Final   Sapovirus (I, II, IV, and V) NOT DETECTED NOT DETECTED Final    Comment: Performed at [facility], [place] [place], [place] [address]  Culture, blood (routine x 2)     Status: None (Preliminary result)   Collection Time: 09/22/20  1:52 PM   Specimen: BLOOD  Result Value Ref Range Status   Specimen Description BLOOD LEFT ANTECUBITAL  Final   Special Requests   Final    BOTTLES DRAWN AEROBIC AND ANAEROBIC Blood Culture results may not be optimal due to an inadequate volume of blood received in culture bottles   Culture   Final    NO GROWTH < 24 HOURS Performed at [place] [address], [place], [place] [address]    Report Status PENDING  Incomplete  Culture, blood (routine x 2)     Status: None (Preliminary result)   Collection Time: 09/22/20  1:57 PM   Specimen: BLOOD  Result Value Ref Range Status   Specimen Description BLOOD LEFT ANTECUBITAL  Final   Special Requests   Final    BOTTLES DRAWN AEROBIC AND ANAEROBIC Blood Culture adequate volume   Culture   Final    NO GROWTH < 24 HOURS Performed at [facility], [place] [address], [place], [place] [address]    Report Status PENDING  Incomplete         Radiology  Studies: CT ANGIO CHEST PE W OR WO CONTRAST  Result Date: 09/22/2020 CLINICAL DATA:  Evaluate for pulmonary embolus. Chest pain and weakness. EXAM: CT ANGIOGRAPHY CHEST WITH CONTRAST TECHNIQUE: Multidetector CT imaging of the chest was performed using the standard protocol during bolus administration of intravenous contrast. Multiplanar CT image reconstructions and MIPs were obtained to evaluate the vascular anatomy. CONTRAST:  19m OMNIPAQUE IOHEXOL 350 MG/ML SOLN COMPARISON:  02/19/2019. FINDINGS: Cardiovascular: The main  pulmonary artery appears patent. No central obstructing embolus identified bilaterally. There are no signs of lobar or segmental pulmonary artery embolus. Heart size within normal limits. No pericardial effusion. Aortic atherosclerosis and coronary artery atherosclerotic calcifications identified. Mediastinum/Nodes: No enlarged mediastinal, hilar, or axillary lymph nodes. Thyroid gland, trachea, and esophagus demonstrate no significant findings. Lungs/Pleura: There are small bilateral pleural effusions. Subsegmental atelectasis noted within the medial lung bases. Bilateral, multifocal areas of ground-glass attenuation and air trapping identified. No airspace consolidation identified. Within the anteromedial right lower lobe there is a perifissural nodule measuring 5 mm, image 70/6. Unchanged from previous exam. Upper Abdomen: No acute abnormality noted within the imaged portions of the upper abdomen. Cyst noted within lateral segment of left hepatic lobe. Small to moderate hiatal hernia. Signs of previous cholecystectomy. Partially exophytic structure arising from segment 5 measures 3.2 cm and 24 Hounsfield units. This is unchanged when compared with 02/19/2019. Musculoskeletal: No acute osseous findings. Review of the MIP images confirms the above findings. IMPRESSION: 1. No evidence for acute pulmonary embolus. 2. Small bilateral pleural effusions. 3. Bilateral, multifocal areas of ground-glass attenuation and air trapping compatible with small airways disease. 4. 5 mm perifissural nodule in the right lower lobe is unchanged from 02/19/2019 and likely represents a benign abnormality. 5. Coronary artery atherosclerotic calcifications. 6. Hiatal hernia. Aortic Atherosclerosis (ICD10-I70.0). Electronically Signed   By: TKerby MoorsM.D.   On: 09/22/2020 11:01   VAS [place]OWER EXTREMITY VENOUS (DVT)  Result Date: 09/22/2020  Lower Venous DVTStudy Indications: Bilateral edema and pain in both legs.  Comparison  Study: No prior. Performing Technologist: ROda CoganRDMS, RVT  Examination Guidelines: A complete evaluation includes B-mode imaging, spectral Doppler, color Doppler, and power Doppler as needed of all accessible portions of each vessel. Bilateral testing is considered an integral part of a complete examination. Limited examinations for reoccurring indications may be performed as noted. The reflux portion of the exam is performed with the patient in reverse Trendelenburg.  +---------+---------------+---------+-----------+----------+--------------+ RIGHT    CompressibilityPhasicitySpontaneityPropertiesThrombus Aging +---------+---------------+---------+-----------+----------+--------------+ CFV      Full           Yes      Yes                                 +---------+---------------+---------+-----------+----------+--------------+ SFJ      Full                                                        +---------+---------------+---------+-----------+----------+--------------+ FV Prox  Full                                                        +---------+---------------+---------+-----------+----------+--------------+  FV Mid   Full                                                        +---------+---------------+---------+-----------+----------+--------------+ FV DistalFull                                                        +---------+---------------+---------+-----------+----------+--------------+ PFV      Full                                                        +---------+---------------+---------+-----------+----------+--------------+ POP      Full           Yes      Yes                                 +---------+---------------+---------+-----------+----------+--------------+ PTV      Full                                                        +---------+---------------+---------+-----------+----------+--------------+ PERO     Full                                                         +---------+---------------+---------+-----------+----------+--------------+   +---------+---------------+---------+-----------+----------+-----------------+ LEFT     CompressibilityPhasicitySpontaneityPropertiesThrombus Aging    +---------+---------------+---------+-----------+----------+-----------------+ CFV      Full           No       Yes                                    +---------+---------------+---------+-----------+----------+-----------------+ SFJ      Full                                                           +---------+---------------+---------+-----------+----------+-----------------+ FV Prox  Full                                                           +---------+---------------+---------+-----------+----------+-----------------+ FV Mid   Partial                                                        +---------+---------------+---------+-----------+----------+-----------------+  FV DistalPartial        No       No                   Age Indeterminate +---------+---------------+---------+-----------+----------+-----------------+ PFV      Full                                                           +---------+---------------+---------+-----------+----------+-----------------+ POP      None           No       No                   Age Indeterminate +---------+---------------+---------+-----------+----------+-----------------+ PTV      Partial                                      Age Indeterminate +---------+---------------+---------+-----------+----------+-----------------+ PERO     Partial                                      Age Indeterminate +---------+---------------+---------+-----------+----------+-----------------+     Summary: RIGHT: - There is no evidence of deep vein thrombosis in the lower extremity.  - No cystic structure found in the popliteal fossa.  LEFT: -  Findings consistent with age indeterminate deep vein thrombosis involving the left femoral vein, left popliteal vein, left posterior tibial veins, and left peroneal veins.  *See table(s) above for measurements and observations. Electronically signed by Servando Snare MD on 09/22/2020 at 5:30:29 PM.    Final         Scheduled Meds: . bismuth subsalicylate  047 mg Oral TID  . doxazosin  1 mg Oral QHS  . doxycycline  100 mg Oral Q12H  . enoxaparin (LOVENOX) injection  70 mg Subcutaneous Q12H  . feeding supplement (ENSURE ENLIVE)  237 mL Oral BID BM  . insulin aspart  0-9 Units Subcutaneous TID WC  . metoprolol tartrate  25 mg Oral BID  . nystatin  5 mL Oral QID  . rosuvastatin  5 mg Oral QHS   Continuous Infusions: . sodium chloride 100 mL/hr at 09/23/20 0840     LOS: 5 days    Time spent: 35 minutes.     Elmarie Shiley, MD [facility]   If 7PM-7AM, please contact night-coverage [email or web address]  09/23/2020, 3:37 PM

## 2020-09-23 NOTE — Progress Notes (Signed)
Progress Note   Subjective  Chief Complaint: Diarrhea and nausea  This morning patient is found on the bedpan.  She tells me she only had one bowel movement yesterday after we started Pepto-Bismol.  She describes no further nausea.  Overall she is feeling well and actually asking if she can go home.   Objective   Vital signs in last 24 hours: Temp:  [97.4 F (36.3 C)-98.8 F (37.1 C)] 97.4 F (36.3 C) (10/09 1001) Pulse Rate:  [100-122] 116 (10/09 1001) Resp:  [18-20] 20 (10/09 1001) BP: (138-169)/(55-77) 145/73 (10/09 1001) SpO2:  [93 %-97 %] 95 % (10/09 1001) Weight:  [73.7 kg] 73.7 kg (10/08 2031) Last BM Date: 09/22/20 General:    white female in NAD Heart:  Regular rate and rhythm; no murmurs Lungs: Respirations even and unlabored, lungs CTA bilaterally Abdomen:  Soft, nontender and nondistended. Normal bowel sounds. Extremities:  Without edema. Neurologic:  Alert and oriented,  grossly normal neurologically. Psych:  Cooperative. Normal mood and affect.  Intake/Output from previous day: 10/08 0701 - 10/09 0700 In: 2892.5 [P.O.:800; I.V.:2092.5] Out: 850 [Urine:850] Intake/Output this shift: Total I/O In: 120 [P.O.:120] Out: -   Lab Results: Recent Labs    09/21/20 0145 09/22/20 0407 09/23/20 0212  WBC 7.9 8.3 7.9  HGB 9.2* 9.1* 9.5*  HCT 28.3* 27.7* 29.0*  PLT 162 178  187 193   BMET Recent Labs    09/21/20 0145 09/22/20 0407 09/23/20 0212  NA 137 138 139  K 4.3 3.7 3.8  CL 106 108 110  CO2 17* 20* 18*  GLUCOSE 128* 139* 87  BUN 16 15 9   CREATININE 1.11* 0.88 0.85  CALCIUM 8.1* 8.1* 8.1*   LFT Recent Labs    09/21/20 0145  PROT 5.0*  ALBUMIN 2.3*  AST 14*  ALT 15  ALKPHOS 38  BILITOT 1.1   PT/INR Recent Labs    09/22/20 0407  LABPROT 14.2  INR 1.1    Studies/Results: CT ANGIO CHEST PE W OR WO CONTRAST  Result Date: 09/22/2020 CLINICAL DATA:  Evaluate for pulmonary embolus. Chest pain and weakness. EXAM: CT ANGIOGRAPHY  CHEST WITH CONTRAST TECHNIQUE: Multidetector CT imaging of the chest was performed using the standard protocol during bolus administration of intravenous contrast. Multiplanar CT image reconstructions and MIPs were obtained to evaluate the vascular anatomy. CONTRAST:  76mL OMNIPAQUE IOHEXOL 350 MG/ML SOLN COMPARISON:  02/19/2019. FINDINGS: Cardiovascular: The main pulmonary artery appears patent. No central obstructing embolus identified bilaterally. There are no signs of lobar or segmental pulmonary artery embolus. Heart size within normal limits. No pericardial effusion. Aortic atherosclerosis and coronary artery atherosclerotic calcifications identified. Mediastinum/Nodes: No enlarged mediastinal, hilar, or axillary lymph nodes. Thyroid gland, trachea, and esophagus demonstrate no significant findings. Lungs/Pleura: There are small bilateral pleural effusions. Subsegmental atelectasis noted within the medial lung bases. Bilateral, multifocal areas of ground-glass attenuation and air trapping identified. No airspace consolidation identified. Within the anteromedial right lower lobe there is a perifissural nodule measuring 5 mm, image 70/6. Unchanged from previous exam. Upper Abdomen: No acute abnormality noted within the imaged portions of the upper abdomen. Cyst noted within lateral segment of left hepatic lobe. Small to moderate hiatal hernia. Signs of previous cholecystectomy. Partially exophytic structure arising from segment 5 measures 3.2 cm and 24 Hounsfield units. This is unchanged when compared with 02/19/2019. Musculoskeletal: No acute osseous findings. Review of the MIP images confirms the above findings. IMPRESSION: 1. No evidence for acute pulmonary embolus. 2. Small bilateral  pleural effusions. 3. Bilateral, multifocal areas of ground-glass attenuation and air trapping compatible with small airways disease. 4. 5 mm perifissural nodule in the right lower lobe is unchanged from 02/19/2019 and likely  represents a benign abnormality. 5. Coronary artery atherosclerotic calcifications. 6. Hiatal hernia. Aortic Atherosclerosis (ICD10-I70.0). Electronically Signed   By: Kerby Moors M.D.   On: 09/22/2020 11:01   VAS [place] LOWER EXTREMITY VENOUS (DVT)  Result Date: 09/22/2020  Lower Venous DVTStudy Indications: Bilateral edema and pain in both legs.  Comparison Study: No prior. Performing Technologist: Oda Cogan RDMS, RVT  Examination Guidelines: A complete evaluation includes B-mode imaging, spectral Doppler, color Doppler, and power Doppler as needed of all accessible portions of each vessel. Bilateral testing is considered an integral part of a complete examination. Limited examinations for reoccurring indications may be performed as noted. The reflux portion of the exam is performed with the patient in reverse Trendelenburg.  +---------+---------------+---------+-----------+----------+--------------+ RIGHT    CompressibilityPhasicitySpontaneityPropertiesThrombus Aging +---------+---------------+---------+-----------+----------+--------------+ CFV      Full           Yes      Yes                                 +---------+---------------+---------+-----------+----------+--------------+ SFJ      Full                                                        +---------+---------------+---------+-----------+----------+--------------+ FV Prox  Full                                                        +---------+---------------+---------+-----------+----------+--------------+ FV Mid   Full                                                        +---------+---------------+---------+-----------+----------+--------------+ FV DistalFull                                                        +---------+---------------+---------+-----------+----------+--------------+ PFV      Full                                                         +---------+---------------+---------+-----------+----------+--------------+ POP      Full           Yes      Yes                                 +---------+---------------+---------+-----------+----------+--------------+ PTV      Full                                                        +---------+---------------+---------+-----------+----------+--------------+  PERO     Full                                                        +---------+---------------+---------+-----------+----------+--------------+   +---------+---------------+---------+-----------+----------+-----------------+ LEFT     CompressibilityPhasicitySpontaneityPropertiesThrombus Aging    +---------+---------------+---------+-----------+----------+-----------------+ CFV      Full           No       Yes                                    +---------+---------------+---------+-----------+----------+-----------------+ SFJ      Full                                                           +---------+---------------+---------+-----------+----------+-----------------+ FV Prox  Full                                                           +---------+---------------+---------+-----------+----------+-----------------+ FV Mid   Partial                                                        +---------+---------------+---------+-----------+----------+-----------------+ FV DistalPartial        No       No                   Age Indeterminate +---------+---------------+---------+-----------+----------+-----------------+ PFV      Full                                                           +---------+---------------+---------+-----------+----------+-----------------+ POP      None           No       No                   Age Indeterminate +---------+---------------+---------+-----------+----------+-----------------+ PTV      Partial                                      Age  Indeterminate +---------+---------------+---------+-----------+----------+-----------------+ PERO     Partial                                      Age Indeterminate +---------+---------------+---------+-----------+----------+-----------------+     Summary: RIGHT: - There is no evidence of deep vein thrombosis in the lower extremity.  - No cystic structure found in the popliteal fossa.  LEFT: - Findings consistent with age indeterminate deep vein thrombosis involving the left femoral vein, left popliteal vein, left posterior tibial veins, and left peroneal veins.  *See table(s) above for measurements and observations. Electronically signed by Servando Snare MD on 09/22/2020 at 5:30:29 PM.    Final        Assessment / Plan:   Assessment: 1.  Diarrhea: 09/19/2020 fecal lactoferrin positive and gastrointestinal panel positive for Yersinia enterocolitica, doxycycline started 09/20/2020, patient had continue to 3-4 loose bowel movements per day and 1:59 at night, but since adding Pepto-Bismol 3 times daily yesterday this is slowed to only 1 bowel movement and none overnight, nausea is improved 2.  Nausea 3.  AKI on CKD stage III 4.  Generalized weakness/deconditioning 5.  Left adrenal mass 6.  Low extremity deep purpuric rash  Plan: 1.  See extensive consult note from Dr. Havery Moros yesterday.  Patient seems better with Pepto-Bismol which she can continue as an outpatient.  Hoping that this Yersinia clears over the next 12 to 22 days as per CDC.  She should continue Doxycycline for course of at least 5 days. 2.  Continue Pepto-Bismol 3 times daily 3.  Please await any final recommendations from Dr. Havery Moros later today.  We will sign off.  Thank you for kind consultation.  LOS: 5 days   Levin Erp  09/23/2020, 10:34 AM

## 2020-09-24 DIAGNOSIS — N189 Chronic kidney disease, unspecified: Secondary | ICD-10-CM | POA: Diagnosis not present

## 2020-09-24 DIAGNOSIS — N179 Acute kidney failure, unspecified: Principal | ICD-10-CM

## 2020-09-24 LAB — BASIC METABOLIC PANEL
Anion gap: 8 (ref 5–15)
BUN: 9 mg/dL (ref 8–23)
CO2: 20 mmol/L — ABNORMAL LOW (ref 22–32)
Calcium: 8.1 mg/dL — ABNORMAL LOW (ref 8.9–10.3)
Chloride: 111 mmol/L (ref 98–111)
Creatinine, Ser: 0.76 mg/dL (ref 0.44–1.00)
GFR, Estimated: >60 mL/min (ref >60)
Glucose, Bld: 95 mg/dL (ref 70–99)
Potassium: 3.8 mmol/L (ref 3.5–5.1)
Sodium: 139 mmol/L (ref 135–145)

## 2020-09-24 LAB — CBC
HCT: 27.7 % — ABNORMAL LOW (ref 36.0–46.0)
Hemoglobin: 8.9 g/dL — ABNORMAL LOW (ref 12.0–15.0)
MCH: 29.7 pg (ref 26.0–34.0)
MCHC: 32.1 g/dL (ref 30.0–36.0)
MCV: 92.3 fL (ref 80.0–100.0)
Platelets: 194 10*3/uL (ref 150–400)
RBC: 3 MIL/uL — ABNORMAL LOW (ref 3.87–5.11)
RDW: 14.6 % (ref 11.5–15.5)
WBC: 7.2 10*3/uL (ref 4.0–10.5)
nRBC: 0.3 % — ABNORMAL HIGH (ref 0.0–0.2)

## 2020-09-24 LAB — MAGNESIUM: Magnesium: 1.8 mg/dL (ref 1.7–2.4)

## 2020-09-24 LAB — GLUCOSE, CAPILLARY
Glucose-Capillary: 89 mg/dL (ref 70–99)
Glucose-Capillary: 135 mg/dL — ABNORMAL HIGH (ref 70–99)
Glucose-Capillary: 114 mg/dL — ABNORMAL HIGH (ref 70–99)
Glucose-Capillary: 93 mg/dL (ref 70–99)

## 2020-09-24 MED ORDER — DOXYCYCLINE HYCLATE 100 MG PO TABS
100.0000 mg | ORAL_TABLET | Freq: Two times a day (BID) | ORAL | Status: AC
  Administered 2020-09-24 (×2): 100 mg via ORAL
  Filled 2020-09-24 (×2): qty 1

## 2020-09-24 NOTE — Consult Note (Addendum)
[facility] for Infectious Disease       Reason for Consult: diarrhea    Referring Physician: Dr. Tyrell Antonio  Principal Problem:   Acute kidney injury superimposed on CKD [facility]) Active Problems:   Type 2 diabetes mellitus with stage 3 chronic kidney disease ([place])   Hypertension associated with diabetes ([place])   Left adrenal mass ([place])   Generalized weakness   Hyperlipidemia associated with type 2 diabetes mellitus ([place])   Diarrhea of presumed infectious origin   Enteritis, Yersinia enterocolitica   . bismuth subsalicylate  161 mg Oral TID  . doxazosin  1 mg Oral QHS  . doxycycline  100 mg Oral Q12H  . enoxaparin (LOVENOX) injection  70 mg Subcutaneous Q12H  . feeding supplement (ENSURE ENLIVE)  237 mL Oral BID BM  . insulin aspart  0-9 Units Subcutaneous TID WC  . metoprolol tartrate  25 mg Oral BID  . nystatin  5 mL Oral QID  . rosuvastatin  5 mg Oral QHS    Recommendations: I will defer to GI who ordered the test if further PCR testing indicated.   Assessment: She has diarrhea and positive Yersinia on the GI pathogen panel and has had slow improvement with supportive care and doxycycline.  GI also ordered a C diff test and is Ag positive and toxin negative.  She has been on doxycycline and is nauseous.   I don't see any absolute indication for C diff testing.   Thanks for consultation, ID service will sign off  Antibiotics: doxycycline  HPI: Anna Stone is a 79 y.o. female with diarrhea and found on GI pathogen panel to be positive for Yersinia enterocolitis.  No known exposures to unpasteurized products.  Has had significant disease and placed on doxycycline.  She has had two watery stools today.  No fever, no chills.  Drinking Ensure twice a day.  Overall weak and has not been out of bed during the hospitalization.   Poor po intake.   Review of Systems:  Constitutional: negative for fevers and chills Gastrointestinal: positive for nausea and  diarrhea, negative for vomiting Integument/breast: negative for rash All other systems reviewed and are negative    Past Medical History:  Diagnosis Date  . Cancer ([place])   . Chronic kidney disease    stage 3  . Depression   . Diabetes mellitus without complication ([place])   . Difficulty breathing   . GERD (gastroesophageal reflux disease)   . Hypertension   . IBS (irritable bowel syndrome)   . Muscle pain   . Palpitation   . PONV (postoperative nausea and vomiting)   . Swelling     Social History   Tobacco Use  . Smoking status: Never Smoker  . Smokeless tobacco: Never Used  Substance Use Topics  . Alcohol use: No  . Drug use: No    No family history on file.  Allergies  Allergen Reactions  . Sulfa Antibiotics Itching and Rash  . Adhesive [Tape] Dermatitis    Blisters the skin    Physical Exam: Constitutional: in no apparent distress  Vitals:   09/24/20 0500 09/24/20 0910  BP: 130/63 (!) 155/64  Pulse: 97 (!) 113  Resp:  18  Temp: 97.6 F (36.4 C) 97.8 F (36.6 C)  SpO2: 97% 97%   EYES: anicteric Cardiovascular: Cor RRR Respiratory: clear; GI: soft, nt, nd   Lab Results  Component Value Date   WBC 7.2 09/24/2020   HGB 8.9 (L) 09/24/2020  HCT 27.7 (L) 09/24/2020   MCV 92.3 09/24/2020   PLT 194 09/24/2020    Lab Results  Component Value Date   CREATININE 0.76 09/24/2020   BUN 9 09/24/2020   NA 139 09/24/2020   K 3.8 09/24/2020   CL 111 09/24/2020   CO2 20 (L) 09/24/2020    Lab Results  Component Value Date   ALT 15 09/21/2020   AST 14 (L) 09/21/2020   ALKPHOS 38 09/21/2020     Microbiology: Recent Results (from the past 240 hour(s))  Blood culture (routine x 2)     Status: None   Collection Time: 09/17/20 11:20 PM   Specimen: BLOOD  Result Value Ref Range Status   Specimen Description BLOOD RIGHT ANTECUBITAL  Final   Special Requests   Final    BOTTLES DRAWN AEROBIC AND ANAEROBIC Blood Culture results may not be optimal due to an  inadequate volume of blood received in culture bottles   Culture   Final    NO GROWTH 5 DAYS Performed at [facility], [place] [address], [place], [place] [address]    Report Status 09/22/2020 FINAL  Final  Blood culture (routine x 2)     Status: None   Collection Time: 09/17/20 11:20 PM   Specimen: BLOOD RIGHT HAND  Result Value Ref Range Status   Specimen Description BLOOD RIGHT HAND  Final   Special Requests   Final    BOTTLES DRAWN AEROBIC AND ANAEROBIC Blood Culture adequate volume   Culture   Final    NO GROWTH 5 DAYS Performed at [facility], [place] [address] [place], [place] [address]    Report Status 09/22/2020 FINAL  Final  Respiratory Panel by RT PCR (Flu A&B, Covid) - Nasopharyngeal Swab     Status: None   Collection Time: 09/17/20 11:31 PM   Specimen: Nasopharyngeal Swab  Result Value Ref Range Status   SARS Coronavirus 2 by RT PCR NEGATIVE NEGATIVE Final    Comment: (NOTE) SARS-CoV-2 target nucleic acids are NOT DETECTED.  The SARS-CoV-2 RNA is generally detectable in upper respiratoy specimens during the acute phase of infection. The lowest concentration of SARS-CoV-2 viral copies this assay can detect is 131 copies/mL. A negative result does not preclude SARS-Cov-2 infection and should not be used as the sole basis for treatment or other patient management decisions. A negative result may occur with  improper specimen collection/handling, submission of specimen other than nasopharyngeal swab, presence of viral mutation(s) within the areas targeted by this assay, and inadequate number of viral copies (<131 copies/mL). A negative result must be combined with clinical observations, patient history, and epidemiological information. The expected result is Negative.  Fact Sheet for Patients:  PinkCheek.be  Fact Sheet for Healthcare Providers:  GravelBags.it  This test is no t yet approved or  cleared by the [place] FDA and  has been authorized for detection and/or diagnosis of SARS-CoV-2 by FDA under an Emergency Use Authorization (EUA). This EUA will remain  in effect (meaning this test can be used) for the duration of the COVID-19 declaration under Section 564(b)(1) of the Act, 21 U.S.C. section 360bbb-3(b)(1), unless the authorization is terminated or revoked sooner.     Influenza A by PCR NEGATIVE NEGATIVE Final   Influenza B by PCR NEGATIVE NEGATIVE Final    Comment: (NOTE) The Xpert Xpress SARS-CoV-2/FLU/RSV assay is intended as an aid in  the diagnosis of influenza from Nasopharyngeal swab specimens and  should not be used  as a sole basis for treatment. Nasal washings and  aspirates are unacceptable for Xpert Xpress SARS-CoV-2/FLU/RSV  testing.  Fact Sheet for Patients: PinkCheek.be  Fact Sheet for Healthcare Providers: GravelBags.it  This test is not yet approved or cleared by the [place] FDA and  has been authorized for detection and/or diagnosis of SARS-CoV-2 by  FDA under an Emergency Use Authorization (EUA). This EUA will remain  in effect (meaning this test can be used) for the duration of the  Covid-19 declaration under Section 564(b)(1) of the Act, 21  U.S.C. section 360bbb-3(b)(1), unless the authorization is  terminated or revoked. Performed at [facility], [place] [address], [place], [place] [address]   Urine Culture     Status: Abnormal   Collection Time: 09/18/20 11:37 AM   Specimen: Urine, Random  Result Value Ref Range Status   Specimen Description URINE, RANDOM  Final   Special Requests NONE  Final   Culture (A)  Final    <10,000 COLONIES/mL INSIGNIFICANT GROWTH Performed at [facility], [place] [address], [place], [place] [address]    Report Status 09/19/2020 FINAL  Final  Gastrointestinal Panel by PCR , Stool     Status: Abnormal   Collection Time: 09/19/20   4:19 PM   Specimen: Stool  Result Value Ref Range Status   Campylobacter species NOT DETECTED NOT DETECTED Final   Plesimonas shigelloides NOT DETECTED NOT DETECTED Final   Salmonella species NOT DETECTED NOT DETECTED Final   Yersinia enterocolitica DETECTED (A) NOT DETECTED Final    Comment: RESULT CALLED TO, READ BACK BY AND VERIFIED WITH: YARI VALDIVIA @1628  09/20/20 MJU    Vibrio species NOT DETECTED NOT DETECTED Final   Vibrio cholerae NOT DETECTED NOT DETECTED Final   Enteroaggregative E coli (EAEC) NOT DETECTED NOT DETECTED Final   Enteropathogenic E coli (EPEC) NOT DETECTED NOT DETECTED Final   Enterotoxigenic E coli (ETEC) NOT DETECTED NOT DETECTED Final   Shiga like toxin producing E coli (STEC) NOT DETECTED NOT DETECTED Final   Shigella/Enteroinvasive E coli (EIEC) NOT DETECTED NOT DETECTED Final   Cryptosporidium NOT DETECTED NOT DETECTED Final   Cyclospora cayetanensis NOT DETECTED NOT DETECTED Final   Entamoeba histolytica NOT DETECTED NOT DETECTED Final   Giardia lamblia NOT DETECTED NOT DETECTED Final   Adenovirus F40/41 NOT DETECTED NOT DETECTED Final   Astrovirus NOT DETECTED NOT DETECTED Final   Norovirus GI/GII NOT DETECTED NOT DETECTED Final   Rotavirus A NOT DETECTED NOT DETECTED Final   Sapovirus (I, II, IV, and V) NOT DETECTED NOT DETECTED Final    Comment: Performed at [facility], [place] [place], [place] [address]  Culture, blood (routine x 2)     Status: None (Preliminary result)   Collection Time: 09/22/20  1:52 PM   Specimen: BLOOD  Result Value Ref Range Status   Specimen Description BLOOD LEFT ANTECUBITAL  Final   Special Requests   Final    BOTTLES DRAWN AEROBIC AND ANAEROBIC Blood Culture results may not be optimal due to an inadequate volume of blood received in culture bottles   Culture   Final    NO GROWTH < 24 HOURS Performed at [place] [address] [place], [place] [address]    Report Status PENDING   Incomplete  Culture, blood (routine x 2)     Status: None (Preliminary result)   Collection Time: 09/22/20  1:57 PM   Specimen: BLOOD  Result Value Ref Range  Status   Specimen Description BLOOD LEFT ANTECUBITAL  Final   Special Requests   Final    BOTTLES DRAWN AEROBIC AND ANAEROBIC Blood Culture adequate volume   Culture   Final    NO GROWTH < 24 HOURS Performed at [facility], [address] [place], [place] [address]    Report Status PENDING  Incomplete  C Difficile Quick Screen (NO PCR Reflex)     Status: Abnormal   Collection Time: 09/23/20 11:01 AM   Specimen: STOOL  Result Value Ref Range Status   C Diff antigen POSITIVE (A) NEGATIVE Final   C Diff toxin NEGATIVE NEGATIVE Final   C Diff interpretation   Final    Results are indeterminate. Please contact your campus specific ID physician or [place].    Comment: Performed at [facility], [place] [address] [place],  [address]    Anna Stone Anna Stone, [facility] Infectious Disease [facility] [email or web address] 09/24/2020, 1:42 PM

## 2020-09-24 NOTE — Progress Notes (Signed)
PROGRESS NOTE    Anna Stone  TKW:[id number] DOB: 05-09-1941 DOA: 09/17/2020 PCP: Marco Collie, MD   Brief Narrative: 79 year old with past medical history significant for diabetes, CKD stage IIIa, hypertension, hyperlipidemia who presents to the emergency room for evaluation of generalized weakness.  Patient was recently admitted in September for management of sepsis due to sigmoid colitis, she was also found to have AKI at that time was treated with IV antibiotics and fluids.  She was recommended a skilled nursing facility on discharge but she did declined and went home.  Since she has been at home she been feeling weak, unable to ambulate.  She also complained of diarrhea and decreased oral intake. On admission she was found to have AKI, started on IV fluids.  PT OT recommending skilled nursing facility.  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 She was started on Doxycycline. Patient continue to have poor oral intake.    Assessment & Plan:   Principal Problem:   Acute kidney injury superimposed on CKD ([place]) Active Problems:   Type 2 diabetes mellitus with stage 3 chronic kidney disease ([place])   Hypertension associated with diabetes ([place])   Left adrenal mass (HCC)   Generalized weakness   Hyperlipidemia associated with type 2 diabetes mellitus (HCC)   Diarrhea of presumed infectious origin   Enteritis, Yersinia enterocolitica  1-AKI on CKD stage IIIa: Secondary to prerenal, poor oral intake diuretics ACE and ARB. Creatinine baseline 1.2-1.4 Continue to hold diuretics. Metabolic acidosis, likely related to diarrhea. Renal function stable.   2-Generalized weakness: Failure to thrive: PT/OT recommending skilled nursing facility   3-Diarrhea: Acute on chronic: Recently  treated for colitis. Lactoferrin positive GI; growing yersinia enterocolitica.  Started on Doxycycline, day 5/5 No significant improvement, complaining of diarrhea and nausea. GI  consulted.  Appreciate GI assistance, started on Pepto, which help.  Report 2-3 loose stool today, nausea.  ID consulted, due to positive C diff antigen.   4-Hyperkalemia;  Resolved.   5-Rash LE, purpuric  ESR elevated 100 , C ANCA, ANA< negative.  No thrombocytopenia.  DIC negative.  Might be related to GI infection.   Left LE DVT, femoral, popliteal, tibial and peroneal: Elevated D dimer; Tachycardia.  Continue with Lovenox. Plan to transition to eliquis in 1-2 days.  Doppler positive for DVT . CT angio negative for PE>BL multifocal areas of ground glass attenuation compatible with small airway diseases. Needs follow up by pulmonologist.   Hypotension;  BP on hold initially. Resolved.    HTN; Continue with metoprolol   DM; type 2; SSI  Left adrenal mass; Needs follow up.  Hypomagnesemia; replete IV again   Estimated body mass index is 30 kg/m as calculated from the following:   Height as of this encounter: _0  (1.575 m).   Weight as of this encounter: 74.4 kg.   DVT prophylaxis: Heparin Code Status: DNR Family Communication: Care discussed with patient , husband 10/08, son 10-10 Disposition Plan:  Status is: Inpatient  Remains inpatient appropriate because:Ongoing diagnostic testing needed not appropriate for outpatient work up   Dispo: The patient is from: Home              Anticipated d/c is to: SNF              Anticipated d/c date is: 2 days              Patient currently is not medically stable to d/c.     Consultants:  None  Procedures:   None  Antimicrobials:    Subjective: She has had 2 loose BM today and one last night.  Report nausea.  Still not eating due to smell of food.   Objective: Vitals:   09/23/20 2042 09/24/20 0500 09/24/20 0500 09/24/20 0910  BP: (!) 148/57  130/63 (!) 155/64  Pulse: (!) 106  97 (!) 113  Resp:    18  Temp: 99.4 F (37.4 C)  97.6 F (36.4 C) 97.8 F (36.6 C)  TempSrc: Oral  Oral Oral  SpO2: 97%   97% 97%  Weight: 74.4 kg 74.4 kg    Height:        Intake/Output Summary (Last 24 hours) at 09/24/2020 1340 Last data filed at 09/24/2020 1000 Gross per 24 hour  Intake 780 ml  Output 1125 ml  Net -345 ml   Filed Weights   09/22/20 2031 09/23/20 2042 09/24/20 0500  Weight: 73.7 kg 74.4 kg 74.4 kg    Examination:  General exam: NAD Respiratory system: S 1, S 2 RRR Cardiovascular system: BS present, soft, nt Gastrointestinal system: BS present, soft, nt Central nervous system: alert Extremities: BL purpuric rash.    Data Reviewed: I have personally reviewed following labs and imaging studies  CBC: Recent Labs  Lab 09/18/20 0427 09/21/20 0145 09/22/20 0407 09/23/20 0212 09/24/20 0126  WBC 6.6 7.9 8.3 7.9 7.2  HGB 10.6* 9.2* 9.1* 9.5* 8.9*  HCT 31.9* 28.3* 27.7* 29.0* 27.7*  MCV 94.7 92.5 91.7 92.4 92.3  PLT 205 162 178  187 193 093   Basic Metabolic Panel: Recent Labs  Lab 09/17/20 2318 09/18/20 0427 09/20/20 0055 09/21/20 0145 09/22/20 0407 09/23/20 0212 09/24/20 0126  NA  --    < > 135 137 138 139 139  K  --    < > 4.5 4.3 3.7 3.8 3.8  CL  --    < > 102 106 108 110 111  CO2  --    < > 23 17* 20* 18* 20*  GLUCOSE  --    < > 113* 128* 139* 87 95  BUN  --    < > _0 CREATININE  --    < > 1.20* 1.11* 0.88 0.85 0.76  CALCIUM  --    < > 8.2* 8.1* 8.1* 8.1* 8.1*  MG 1.4*  --   --   --   --  1.1* 1.8   < > = values in this interval not displayed.   GFR: Estimated Creatinine Clearance: 52.1 mL/min (by C-G formula based on SCr of 0.76 mg/dL). Liver Function Tests: Recent Labs  Lab 09/21/20 0145  AST 14*  ALT 15  ALKPHOS 38  BILITOT 1.1  PROT 5.0*  ALBUMIN 2.3*   No results for input(s): LIPASE, AMYLASE in the last 168 hours. No results for input(s): AMMONIA in the last 168 hours. Coagulation Profile: Recent Labs  Lab 09/22/20 0407  INR 1.1   Cardiac Enzymes: No results for input(s): CKTOTAL, CKMB, CKMBINDEX, TROPONINI in the last  168 hours. BNP (last 3 results) No results for input(s): PROBNP in the last 8760 hours. HbA1C: No results for input(s): HGBA1C in the last 72 hours. CBG: Recent Labs  Lab 09/23/20 1146 09/23/20 1724 09/23/20 2043 09/24/20 0649 09/24/20 1124  GLUCAP 138* 98 93 89 135*   Lipid Profile: No results for input(s): CHOL, HDL, LDLCALC, TRIG, CHOLHDL, LDLDIRECT in the last 72 hours. Thyroid Function Tests: No results for input(s):  TSH, T4TOTAL, FREET4, T3FREE, THYROIDAB in the last 72 hours. Anemia Panel: No results for input(s): VITAMINB12, FOLATE, FERRITIN, TIBC, IRON, RETICCTPCT in the last 72 hours. Sepsis Labs: Recent Labs  Lab 09/17/20 1937  LATICACIDVEN 1.1    Recent Results (from the past 240 hour(s))  Blood culture (routine x 2)     Status: None   Collection Time: 09/17/20 11:20 PM   Specimen: BLOOD  Result Value Ref Range Status   Specimen Description BLOOD RIGHT ANTECUBITAL  Final   Special Requests   Final    BOTTLES DRAWN AEROBIC AND ANAEROBIC Blood Culture results may not be optimal due to an inadequate volume of blood received in culture bottles   Culture   Final    NO GROWTH 5 DAYS Performed at [facility], [place] [address], [place], [place] [address]    Report Status 09/22/2020 FINAL  Final  Blood culture (routine x 2)     Status: None   Collection Time: 09/17/20 11:20 PM   Specimen: BLOOD RIGHT HAND  Result Value Ref Range Status   Specimen Description BLOOD RIGHT HAND  Final   Special Requests   Final    BOTTLES DRAWN AEROBIC AND ANAEROBIC Blood Culture adequate volume   Culture   Final    NO GROWTH 5 DAYS Performed at [facility], [place] [address], [place], [place] [address]    Report Status 09/22/2020 FINAL  Final  Respiratory Panel by RT PCR (Flu A&B, Covid) - Nasopharyngeal Swab     Status: None   Collection Time: 09/17/20 11:31 PM   Specimen: Nasopharyngeal Swab  Result Value Ref Range Status   SARS Coronavirus 2 by RT PCR NEGATIVE  NEGATIVE Final    Comment: (NOTE) SARS-CoV-2 target nucleic acids are NOT DETECTED.  The SARS-CoV-2 RNA is generally detectable in upper respiratoy specimens during the acute phase of infection. The lowest concentration of SARS-CoV-2 viral copies this assay can detect is 131 copies/mL. A negative result does not preclude SARS-Cov-2 infection and should not be used as the sole basis for treatment or other patient management decisions. A negative result may occur with  improper specimen collection/handling, submission of specimen other than nasopharyngeal swab, presence of viral mutation(s) within the areas targeted by this assay, and inadequate number of viral copies (<131 copies/mL). A negative result must be combined with clinical observations, patient history, and epidemiological information. The expected result is Negative.  Fact Sheet for Patients:  PinkCheek.be  Fact Sheet for Healthcare Providers:  GravelBags.it  This test is no t yet approved or cleared by the [place] FDA and  has been authorized for detection and/or diagnosis of SARS-CoV-2 by FDA under an Emergency Use Authorization (EUA). This EUA will remain  in effect (meaning this test can be used) for the duration of the COVID-19 declaration under Section 564(b)(1) of the Act, 21 U.S.C. section 360bbb-3(b)(1), unless the authorization is terminated or revoked sooner.     Influenza A by PCR NEGATIVE NEGATIVE Final   Influenza B by PCR NEGATIVE NEGATIVE Final    Comment: (NOTE) The Xpert Xpress SARS-CoV-2/FLU/RSV assay is intended as an aid in  the diagnosis of influenza from Nasopharyngeal swab specimens and  should not be used as a sole basis for treatment. Nasal washings and  aspirates are unacceptable for Xpert Xpress SARS-CoV-2/FLU/RSV  testing.  Fact Sheet for Patients: PinkCheek.be  Fact Sheet for Healthcare  Providers: GravelBags.it  This test is not yet approved or cleared by the  [place] Architectural technologist and  has been authorized for detection and/or diagnosis of SARS-CoV-2 by  FDA under an Print production planner (EUA). This EUA will remain  in effect (meaning this test can be used) for the duration of the  Covid-19 declaration under Section 564(b)(1) of the Act, 21  U.S.C. section 360bbb-3(b)(1), unless the authorization is  terminated or revoked. Performed at [facility], [place] [employer] [address], [place], [place] [address]   Urine Culture     Status: Abnormal   Collection Time: 09/18/20 11:37 AM   Specimen: Urine, Random  Result Value Ref Range Status   Specimen Description URINE, RANDOM  Final   Special Requests NONE  Final   Culture (A)  Final    <10,000 COLONIES/mL INSIGNIFICANT GROWTH Performed at [facility], [place] [address], [place], [place] [address]    Report Status 09/19/2020 FINAL  Final  Gastrointestinal Panel by PCR , Stool     Status: Abnormal   Collection Time: 09/19/20  4:19 PM   Specimen: Stool  Result Value Ref Range Status   Campylobacter species NOT DETECTED NOT DETECTED Final   Plesimonas shigelloides NOT DETECTED NOT DETECTED Final   Salmonella species NOT DETECTED NOT DETECTED Final   Yersinia enterocolitica DETECTED (A) NOT DETECTED Final    Comment: RESULT CALLED TO, READ BACK BY AND VERIFIED WITH: YARI VALDIVIA _0  09/20/20 MJU    Vibrio species NOT DETECTED NOT DETECTED Final   Vibrio cholerae NOT DETECTED NOT DETECTED Final   Enteroaggregative E coli (EAEC) NOT DETECTED NOT DETECTED Final   Enteropathogenic E coli (EPEC) NOT DETECTED NOT DETECTED Final   Enterotoxigenic E coli (ETEC) NOT DETECTED NOT DETECTED Final   Shiga like toxin producing E coli (STEC) NOT DETECTED NOT DETECTED Final   Shigella/Enteroinvasive E coli (EIEC) NOT DETECTED NOT DETECTED Final   Cryptosporidium NOT DETECTED NOT DETECTED Final    Cyclospora cayetanensis NOT DETECTED NOT DETECTED Final   Entamoeba histolytica NOT DETECTED NOT DETECTED Final   Giardia lamblia NOT DETECTED NOT DETECTED Final   Adenovirus F40/41 NOT DETECTED NOT DETECTED Final   Astrovirus NOT DETECTED NOT DETECTED Final   Norovirus GI/GII NOT DETECTED NOT DETECTED Final   Rotavirus A NOT DETECTED NOT DETECTED Final   Sapovirus (I, II, IV, and V) NOT DETECTED NOT DETECTED Final    Comment: Performed at [facility], [place] [place], [place] [address]  Culture, blood (routine x 2)     Status: None (Preliminary result)   Collection Time: 09/22/20  1:52 PM   Specimen: BLOOD  Result Value Ref Range Status   Specimen Description BLOOD LEFT ANTECUBITAL  Final   Special Requests   Final    BOTTLES DRAWN AEROBIC AND ANAEROBIC Blood Culture results may not be optimal due to an inadequate volume of blood received in culture bottles   Culture   Final    NO GROWTH < 24 HOURS Performed at [place] [address] [place], [place] [address]    Report Status PENDING  Incomplete  Culture, blood (routine x 2)     Status: None (Preliminary result)   Collection Time: 09/22/20  1:57 PM   Specimen: BLOOD  Result Value Ref Range Status   Specimen Description BLOOD LEFT ANTECUBITAL  Final   Special Requests   Final    BOTTLES DRAWN AEROBIC AND ANAEROBIC Blood Culture adequate volume   Culture   Final    NO GROWTH < 24 HOURS Performed  at [facility], [place] [address], [place], [place] [address]    Report Status PENDING  Incomplete  C Difficile Quick Screen (NO PCR Reflex)     Status: Abnormal   Collection Time: 09/23/20 11:01 AM   Specimen: STOOL  Result Value Ref Range Status   C Diff antigen POSITIVE (A) NEGATIVE Final   C Diff toxin NEGATIVE NEGATIVE Final   C Diff interpretation   Final    Results are indeterminate. Please contact your campus specific ID physician or [place].    Comment: Performed at [facility],  [place] [address], [place], [place] [address]         Radiology Studies: VAS [place] LOWER EXTREMITY VENOUS (DVT)  Result Date: 09/22/2020  Lower Venous DVTStudy Indications: Bilateral edema and pain in both legs.  Comparison Study: No prior. Performing Technologist: Oda Cogan RDMS, RVT  Examination Guidelines: A complete evaluation includes B-mode imaging, spectral Doppler, color Doppler, and power Doppler as needed of all accessible portions of each vessel. Bilateral testing is considered an integral part of a complete examination. Limited examinations for reoccurring indications may be performed as noted. The reflux portion of the exam is performed with the patient in reverse Trendelenburg.  +---------+---------------+---------+-----------+----------+--------------+ RIGHT    CompressibilityPhasicitySpontaneityPropertiesThrombus Aging +---------+---------------+---------+-----------+----------+--------------+ CFV      Full           Yes      Yes                                 +---------+---------------+---------+-----------+----------+--------------+ SFJ      Full                                                        +---------+---------------+---------+-----------+----------+--------------+ FV Prox  Full                                                        +---------+---------------+---------+-----------+----------+--------------+ FV Mid   Full                                                        +---------+---------------+---------+-----------+----------+--------------+ FV DistalFull                                                        +---------+---------------+---------+-----------+----------+--------------+ PFV      Full                                                        +---------+---------------+---------+-----------+----------+--------------+ POP      Full  Yes      Yes                                  +---------+---------------+---------+-----------+----------+--------------+ PTV      Full                                                        +---------+---------------+---------+-----------+----------+--------------+ PERO     Full                                                        +---------+---------------+---------+-----------+----------+--------------+   +---------+---------------+---------+-----------+----------+-----------------+ LEFT     CompressibilityPhasicitySpontaneityPropertiesThrombus Aging    +---------+---------------+---------+-----------+----------+-----------------+ CFV      Full           No       Yes                                    +---------+---------------+---------+-----------+----------+-----------------+ SFJ      Full                                                           +---------+---------------+---------+-----------+----------+-----------------+ FV Prox  Full                                                           +---------+---------------+---------+-----------+----------+-----------------+ FV Mid   Partial                                                        +---------+---------------+---------+-----------+----------+-----------------+ FV DistalPartial        No       No                   Age Indeterminate +---------+---------------+---------+-----------+----------+-----------------+ PFV      Full                                                           +---------+---------------+---------+-----------+----------+-----------------+ POP      None           No       No                   Age Indeterminate +---------+---------------+---------+-----------+----------+-----------------+ PTV      Partial  Age Indeterminate +---------+---------------+---------+-----------+----------+-----------------+ PERO     Partial                                      Age  Indeterminate +---------+---------------+---------+-----------+----------+-----------------+     Summary: RIGHT: - There is no evidence of deep vein thrombosis in the lower extremity.  - No cystic structure found in the popliteal fossa.  LEFT: - Findings consistent with age indeterminate deep vein thrombosis involving the left femoral vein, left popliteal vein, left posterior tibial veins, and left peroneal veins.  *See table(s) above for measurements and observations. Electronically signed by Servando Snare MD on 09/22/2020 at 5:30:29 PM.    Final         Scheduled Meds: . bismuth subsalicylate  592 mg Oral TID  . doxazosin  1 mg Oral QHS  . doxycycline  100 mg Oral Q12H  . enoxaparin (LOVENOX) injection  70 mg Subcutaneous Q12H  . feeding supplement (ENSURE ENLIVE)  237 mL Oral BID BM  . insulin aspart  0-9 Units Subcutaneous TID WC  . metoprolol tartrate  25 mg Oral BID  . nystatin  5 mL Oral QID  . rosuvastatin  5 mg Oral QHS   Continuous Infusions: . sodium chloride 100 mL/hr at 09/24/20 0430     LOS: 6 days    Time spent: 35 minutes.     Elmarie Shiley, MD [facility]   If 7PM-7AM, please contact night-coverage [email or web address]  09/24/2020, 1:40 PM

## 2020-09-25 DIAGNOSIS — N179 Acute kidney failure, unspecified: Secondary | ICD-10-CM | POA: Diagnosis not present

## 2020-09-25 DIAGNOSIS — N189 Chronic kidney disease, unspecified: Secondary | ICD-10-CM | POA: Diagnosis not present

## 2020-09-25 DIAGNOSIS — R197 Diarrhea, unspecified: Secondary | ICD-10-CM | POA: Diagnosis not present

## 2020-09-25 DIAGNOSIS — A046 Enteritis due to Yersinia enterocolitica: Secondary | ICD-10-CM | POA: Diagnosis not present

## 2020-09-25 LAB — CBC
HCT: 31.7 % — ABNORMAL LOW (ref 36.0–46.0)
Hemoglobin: 10.2 g/dL — ABNORMAL LOW (ref 12.0–15.0)
MCH: 29.7 pg (ref 26.0–34.0)
MCHC: 32.2 g/dL (ref 30.0–36.0)
MCV: 92.4 fL (ref 80.0–100.0)
Platelets: 213 K/uL (ref 150–400)
RBC: 3.43 MIL/uL — ABNORMAL LOW (ref 3.87–5.11)
RDW: 14.9 % (ref 11.5–15.5)
WBC: 7.3 K/uL (ref 4.0–10.5)
nRBC: 0.3 % — ABNORMAL HIGH (ref 0.0–0.2)

## 2020-09-25 LAB — BASIC METABOLIC PANEL
Anion gap: 11 (ref 5–15)
BUN: 8 mg/dL (ref 8–23)
CO2: 20 mmol/L — ABNORMAL LOW (ref 22–32)
Calcium: 8.6 mg/dL — ABNORMAL LOW (ref 8.9–10.3)
Chloride: 109 mmol/L (ref 98–111)
Creatinine, Ser: 0.92 mg/dL (ref 0.44–1.00)
GFR, Estimated: 58 mL/min — ABNORMAL LOW (ref >60)
Glucose, Bld: 132 mg/dL — ABNORMAL HIGH (ref 70–99)
Potassium: 3.7 mmol/L (ref 3.5–5.1)
Sodium: 140 mmol/L (ref 135–145)

## 2020-09-25 LAB — GLUCOSE, CAPILLARY
Glucose-Capillary: 83 mg/dL (ref 70–99)
Glucose-Capillary: 148 mg/dL — ABNORMAL HIGH (ref 70–99)
Glucose-Capillary: 77 mg/dL (ref 70–99)
Glucose-Capillary: 124 mg/dL — ABNORMAL HIGH (ref 70–99)

## 2020-09-25 MED ORDER — BISMUTH SUBSALICYLATE 262 MG PO CHEW
524.0000 mg | CHEWABLE_TABLET | Freq: Two times a day (BID) | ORAL | Status: DC
  Administered 2020-09-25: 524 mg via ORAL
  Filled 2020-09-25 (×2): qty 2

## 2020-09-25 NOTE — Progress Notes (Signed)
ANTICOAGULATION CONSULT NOTE - Initial Consult  Pharmacy Consult for Lovenox Indication: DVT  Allergies  Allergen Reactions  . Sulfa Antibiotics Itching and Rash  . Adhesive [Tape] Dermatitis    Blisters the skin    Patient Measurements: Height: 5\' 2"  (157.5 cm) Weight: 74.3 kg (163 lb 12.8 oz) IBW/kg (Calculated) : 50.1  Vital Signs: Temp: 99 F (37.2 C) (10/11 0448) Temp Source: Oral (10/11 0448) BP: 145/56 (10/11 0448) Pulse Rate: 103 (10/11 0448)  Labs: Recent Labs    09/23/20 0212 09/24/20 0126  HGB 9.5* 8.9*  HCT 29.0* 27.7*  PLT 193 194  CREATININE 0.85 0.76    Estimated Creatinine Clearance: 52.1 mL/min (by C-G formula based on SCr of 0.76 mg/dL).   Medical History: Past Medical History:  Diagnosis Date  . Cancer ([place])   . Chronic kidney disease    stage 3  . Depression   . Diabetes mellitus without complication ([place])   . Difficulty breathing   . GERD (gastroesophageal reflux disease)   . Hypertension   . IBS (irritable bowel syndrome)   . Muscle pain   . Palpitation   . PONV (postoperative nausea and vomiting)   . Swelling     Medications:  Scheduled:  . bismuth subsalicylate  530 mg Oral TID  . doxazosin  1 mg Oral QHS  . enoxaparin (LOVENOX) injection  70 mg Subcutaneous Q12H  . feeding supplement (ENSURE ENLIVE)  237 mL Oral BID BM  . insulin aspart  0-9 Units Subcutaneous TID WC  . metoprolol tartrate  25 mg Oral BID  . nystatin  5 mL Oral QID  . rosuvastatin  5 mg Oral QHS    Assessment: 79 years of age female with LE venous ultrasound positive for age indeterminate DVT in left femoral, popliteal, posterior tibial, and peroneal veins. CT Angio is pending to evaluate for pulmonary embolism. Pharmacy consulted to start Lovenox therapy.   H/H trending down slightly. Platelets stable. No bleeding documented. D-dimer 3.64.   Goal of Therapy:  Anti-Xa level 0.6-1 units/ml 4hrs after LMWH dose given as appropriate Monitor platelets by  anticoagulation protocol: Yes   Plan:  Lovenox 70 mg subcutaneous every 12 hours.  Monitor CBC, renal function, and for any signs of bleeding.   Burk Hoctor A. Levada Dy, PharmD, BCPS, FNKF Clinical Pharmacist [place] Please utilize Amion for appropriate phone number to reach the unit pharmacist ([place])   09/25/2020,8:00 AM

## 2020-09-25 NOTE — Plan of Care (Signed)
  Problem: Nutrition: Goal: Adequate nutrition will be maintained Outcome: Progressing   

## 2020-09-25 NOTE — TOC Progression Note (Signed)
Transition of Care [facility]) - Progression Note    Patient Details  Name: Anna Stone MRN: [id number] Date of Birth: 31-Mar-1941  Transition of [facility]) CM/SW Contact  Sharlet Salina Mila Homer, LCSW Phone Number: 09/25/2020, 1:10 PM  Clinical Narrative:  Patient did not discharge over the weekend to [facility]. MD indicated that patient not medically ready for d/c today and is not eating. Talked with Olivia Mackie, admissions director at [facility] (10:21 am) and updated her on patient's progress and that she would not be discharging today per MD. Olivia Mackie asked to be kept updated.      Expected Discharge Plan: Skilled Nursing Facility Barriers to Discharge: SNF Pending bed offer (Initiating facility search today)  Expected Discharge Plan and Services Expected Discharge Plan: [place] In-house Referral: Clinical Social Work   Post Acute Care Choice: [place] Living arrangements for the past 2 months: Single Family Home                                       Social Determinants of Health (SDOH) Interventions    Readmission Risk Interventions No flowsheet data found.

## 2020-09-25 NOTE — Progress Notes (Signed)
PROGRESS NOTE    Anna Stone  LYY:[id number] DOB: 02-Oct-1941 DOA: 09/17/2020 PCP: Marco Collie, MD   Brief Narrative: 79 year old with past medical history significant for diabetes, CKD stage IIIa, hypertension, hyperlipidemia who presents to the emergency room for evaluation of generalized weakness.  Patient was recently admitted in September for management of sepsis due to sigmoid colitis, she was also found to have AKI at that time was treated with IV antibiotics and fluids.  She was recommended a skilled nursing facility on discharge but she did declined and went home.  Since she has been at home she been feeling weak, unable to ambulate.  She also complained of diarrhea and decreased oral intake. On admission she was found to have AKI, started on IV fluids.  PT OT recommending skilled nursing facility.  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 She was started on Doxycycline. Completed Doxy.    Assessment & Plan:   Principal Problem:   Acute kidney injury superimposed on CKD ([place]) Active Problems:   Type 2 diabetes mellitus with stage 3 chronic kidney disease ([place])   Hypertension associated with diabetes ([place])   Left adrenal mass (HCC)   Generalized weakness   Hyperlipidemia associated with type 2 diabetes mellitus (HCC)   Diarrhea of presumed infectious origin   Enteritis, Yersinia enterocolitica  1-AKI on CKD stage IIIa: Secondary to prerenal, poor oral intake diuretics ACE and ARB. Creatinine baseline 1.2-1.4 Continue to hold diuretics. Metabolic acidosis, likely related to diarrhea. Monitor renal function off IV fluids.   2-Generalized weakness: Failure to thrive: PT/OT recommending skilled nursing facility   3-Diarrhea: Acute on chronic: Recently  treated for colitis. Lactoferrin positive GI; growing yersinia enterocolitica.  Completed 5 days of  Doxycycline.  Appreciate GI assistance, started on Pepto, which help.  ID consulted, due to  positive C diff antigen. ID defer to GI to order PCR.  Patient report some improvement today.   4-Hyperkalemia;  Resolved.   5-Rash LE, purpuric  ESR elevated 100 , C ANCA, ANA< negative.  No thrombocytopenia.  DIC negative.  Might be related to GI infection.   Left LE DVT, femoral, popliteal, tibial and peroneal: Elevated D dimer; Tachycardia.  Continue with Lovenox. Plan to transition to eliquis in 1-2 days.  Doppler positive for DVT . CT angio negative for PE>BL multifocal areas of ground glass attenuation compatible with small airway diseases. Needs follow up by pulmonologist. Husband aware.   Hypotension;  BP on hold initially. Resolved.    HTN; Continue with metoprolol   DM; type 2; SSI  Left adrenal mass; Needs follow up.  Hypomagnesemia; replaced.   Estimated body mass index is 29.96 kg/m as calculated from the following:   Height as of this encounter: $RemoveBeforeD'5\' 2"'hRbQBVLBRmNtwX$  (1.575 m).   Weight as of this encounter: 74.3 kg.   DVT prophylaxis: Heparin Code Status: DNR Family Communication: Care discussed with patient , husband 10/08, son 10-10 husband 10/11 Disposition Plan:  Status is: Inpatient  Remains inpatient appropriate because:Ongoing diagnostic testing needed not appropriate for outpatient work up   Dispo: The patient is from: Home              Anticipated d/c is to: SNF              Anticipated d/c date is: 2 days              Patient currently is not medically stable to d/c.     Consultants:   None  Procedures:   None  Antimicrobials:    Subjective: She feels better today, only had small BM today.  She is trying to eat today, ate half yogurt, and drink orange juice. Drinking ensure.   Objective: Vitals:   09/25/20 0448 09/25/20 0500 09/25/20 0928 09/25/20 1641  BP: (!) 145/56  130/63 (!) 141/54  Pulse: (!) 103  (!) 107 99  Resp: $Remo'18  19 18  'yaVpX$ Temp: 99 F (37.2 C)  (!) 97.5 F (36.4 C) 98.6 F (37 C)  TempSrc: Oral  Oral   SpO2: 98%  97%  98%  Weight:  74.3 kg    Height:        Intake/Output Summary (Last 24 hours) at 09/25/2020 1702 Last data filed at 09/25/2020 1415 Gross per 24 hour  Intake 600 ml  Output 750 ml  Net -150 ml   Filed Weights   09/24/20 0500 09/24/20 2104 09/25/20 0500  Weight: 74.4 kg 74.3 kg 74.3 kg    Examination:  General exam: NAD Respiratory system: S1, S 2 RRR Cardiovascular system: S 1, S 2 RRR Gastrointestinal system: BS present,sfot, nt Central nervous system: Alert Extremities: BL purpuric rash LE   Data Reviewed: I have personally reviewed following labs and imaging studies  CBC: Recent Labs  Lab 09/21/20 0145 09/22/20 0407 09/23/20 0212 09/24/20 0126 09/25/20 0859  WBC 7.9 8.3 7.9 7.2 7.3  HGB 9.2* 9.1* 9.5* 8.9* 10.2*  HCT 28.3* 27.7* 29.0* 27.7* 31.7*  MCV 92.5 91.7 92.4 92.3 92.4  PLT 162 178  187 193 194 478   Basic Metabolic Panel: Recent Labs  Lab 09/21/20 0145 09/22/20 0407 09/23/20 0212 09/24/20 0126 09/25/20 0859  NA 137 138 139 139 140  K 4.3 3.7 3.8 3.8 3.7  CL 106 108 110 111 109  CO2 17* 20* 18* 20* 20*  GLUCOSE 128* 139* 87 95 132*  BUN $Re'16 15 9 9 8  'AuK$ CREATININE 1.11* 0.88 0.85 0.76 0.92  CALCIUM 8.1* 8.1* 8.1* 8.1* 8.6*  MG  --   --  1.1* 1.8  --    GFR: Estimated Creatinine Clearance: 45.3 mL/min (by C-G formula based on SCr of 0.92 mg/dL). Liver Function Tests: Recent Labs  Lab 09/21/20 0145  AST 14*  ALT 15  ALKPHOS 38  BILITOT 1.1  PROT 5.0*  ALBUMIN 2.3*   No results for input(s): LIPASE, AMYLASE in the last 168 hours. No results for input(s): AMMONIA in the last 168 hours. Coagulation Profile: Recent Labs  Lab 09/22/20 0407  INR 1.1   Cardiac Enzymes: No results for input(s): CKTOTAL, CKMB, CKMBINDEX, TROPONINI in the last 168 hours. BNP (last 3 results) No results for input(s): PROBNP in the last 8760 hours. HbA1C: No results for input(s): HGBA1C in the last 72 hours. CBG: Recent Labs  Lab 09/24/20 1705  09/24/20 2105 09/25/20 0637 09/25/20 1134 09/25/20 1642  GLUCAP 114* 93 83 148* 77   Lipid Profile: No results for input(s): CHOL, HDL, LDLCALC, TRIG, CHOLHDL, LDLDIRECT in the last 72 hours. Thyroid Function Tests: No results for input(s): TSH, T4TOTAL, FREET4, T3FREE, THYROIDAB in the last 72 hours. Anemia Panel: No results for input(s): VITAMINB12, FOLATE, FERRITIN, TIBC, IRON, RETICCTPCT in the last 72 hours. Sepsis Labs: No results for input(s): PROCALCITON, LATICACIDVEN in the last 168 hours.  Recent Results (from the past 240 hour(s))  Blood culture (routine x 2)     Status: None   Collection Time: 09/17/20 11:20 PM   Specimen: BLOOD  Result Value Ref Range  Status   Specimen Description BLOOD RIGHT ANTECUBITAL  Final   Special Requests   Final    BOTTLES DRAWN AEROBIC AND ANAEROBIC Blood Culture results may not be optimal due to an inadequate volume of blood received in culture bottles   Culture   Final    NO GROWTH 5 DAYS Performed at [facility], [place] [address] [place], [place] [address]    Report Status 09/22/2020 FINAL  Final  Blood culture (routine x 2)     Status: None   Collection Time: 09/17/20 11:20 PM   Specimen: BLOOD RIGHT HAND  Result Value Ref Range Status   Specimen Description BLOOD RIGHT HAND  Final   Special Requests   Final    BOTTLES DRAWN AEROBIC AND ANAEROBIC Blood Culture adequate volume   Culture   Final    NO GROWTH 5 DAYS Performed at [facility], [place] [address] [place], [place] [address]    Report Status 09/22/2020 FINAL  Final  Respiratory Panel by RT PCR (Flu A&B, Covid) - Nasopharyngeal Swab     Status: None   Collection Time: 09/17/20 11:31 PM   Specimen: Nasopharyngeal Swab  Result Value Ref Range Status   SARS Coronavirus 2 by RT PCR NEGATIVE NEGATIVE Final    Comment: (NOTE) SARS-CoV-2 target nucleic acids are NOT DETECTED.  The SARS-CoV-2 RNA is generally detectable in upper respiratoy specimens during the  acute phase of infection. The lowest concentration of SARS-CoV-2 viral copies this assay can detect is 131 copies/mL. A negative result does not preclude SARS-Cov-2 infection and should not be used as the sole basis for treatment or other patient management decisions. A negative result may occur with  improper specimen collection/handling, submission of specimen other than nasopharyngeal swab, presence of viral mutation(s) within the areas targeted by this assay, and inadequate number of viral copies (<131 copies/mL). A negative result must be combined with clinical observations, patient history, and epidemiological information. The expected result is Negative.  Fact Sheet for Patients:  PinkCheek.be  Fact Sheet for Healthcare Providers:  GravelBags.it  This test is no t yet approved or cleared by the [place] FDA and  has been authorized for detection and/or diagnosis of SARS-CoV-2 by FDA under an Emergency Use Authorization (EUA). This EUA will remain  in effect (meaning this test can be used) for the duration of the COVID-19 declaration under Section 564(b)(1) of the Act, 21 U.S.C. section 360bbb-3(b)(1), unless the authorization is terminated or revoked sooner.     Influenza A by PCR NEGATIVE NEGATIVE Final   Influenza B by PCR NEGATIVE NEGATIVE Final    Comment: (NOTE) The Xpert Xpress SARS-CoV-2/FLU/RSV assay is intended as an aid in  the diagnosis of influenza from Nasopharyngeal swab specimens and  should not be used as a sole basis for treatment. Nasal washings and  aspirates are unacceptable for Xpert Xpress SARS-CoV-2/FLU/RSV  testing.  Fact Sheet for Patients: PinkCheek.be  Fact Sheet for Healthcare Providers: GravelBags.it  This test is not yet approved or cleared by the [place] FDA and  has been authorized for detection and/or diagnosis  of SARS-CoV-2 by  FDA under an Emergency Use Authorization (EUA). This EUA will remain  in effect (meaning this test can be used) for the duration of the  Covid-19 declaration under Section 564(b)(1) of the Act, 21  U.S.C. section 360bbb-3(b)(1), unless the authorization is  terminated or revoked. Performed at [facility], [place] [address] [place],  [address]  Urine Culture     Status: Abnormal   Collection Time: 09/18/20 11:37 AM   Specimen: Urine, Random  Result Value Ref Range Status   Specimen Description URINE, RANDOM  Final   Special Requests NONE  Final   Culture (A)  Final    <10,000 COLONIES/mL INSIGNIFICANT GROWTH Performed at [facility], [address], [place], [place] [address]    Report Status 09/19/2020 FINAL  Final  Gastrointestinal Panel by PCR , Stool     Status: Abnormal   Collection Time: 09/19/20  4:19 PM   Specimen: Stool  Result Value Ref Range Status   Campylobacter species NOT DETECTED NOT DETECTED Final   Plesimonas shigelloides NOT DETECTED NOT DETECTED Final   Salmonella species NOT DETECTED NOT DETECTED Final   Yersinia enterocolitica DETECTED (A) NOT DETECTED Final    Comment: RESULT CALLED TO, READ BACK BY AND VERIFIED WITH: Arta Silence $RemoveBefo'@1628'gJmClAuWwhw$  09/20/20 MJU    Vibrio species NOT DETECTED NOT DETECTED Final   Vibrio cholerae NOT DETECTED NOT DETECTED Final   Enteroaggregative E coli (EAEC) NOT DETECTED NOT DETECTED Final   Enteropathogenic E coli (EPEC) NOT DETECTED NOT DETECTED Final   Enterotoxigenic E coli (ETEC) NOT DETECTED NOT DETECTED Final   Shiga like toxin producing E coli (STEC) NOT DETECTED NOT DETECTED Final   Shigella/Enteroinvasive E coli (EIEC) NOT DETECTED NOT DETECTED Final   Cryptosporidium NOT DETECTED NOT DETECTED Final   Cyclospora cayetanensis NOT DETECTED NOT DETECTED Final   Entamoeba histolytica NOT DETECTED NOT DETECTED Final   Giardia lamblia NOT DETECTED NOT DETECTED Final   Adenovirus F40/41  NOT DETECTED NOT DETECTED Final   Astrovirus NOT DETECTED NOT DETECTED Final   Norovirus GI/GII NOT DETECTED NOT DETECTED Final   Rotavirus A NOT DETECTED NOT DETECTED Final   Sapovirus (I, II, IV, and V) NOT DETECTED NOT DETECTED Final    Comment: Performed at [facility], [address] [place], [place] [address]  Culture, blood (routine x 2)     Status: None (Preliminary result)   Collection Time: 09/22/20  1:52 PM   Specimen: BLOOD  Result Value Ref Range Status   Specimen Description BLOOD LEFT ANTECUBITAL  Final   Special Requests   Final    BOTTLES DRAWN AEROBIC AND ANAEROBIC Blood Culture results may not be optimal due to an inadequate volume of blood received in culture bottles   Culture   Final    NO GROWTH 3 DAYS Performed at [place] [address], [place], [place] [address]    Report Status PENDING  Incomplete  Culture, blood (routine x 2)     Status: None (Preliminary result)   Collection Time: 09/22/20  1:57 PM   Specimen: BLOOD  Result Value Ref Range Status   Specimen Description BLOOD LEFT ANTECUBITAL  Final   Special Requests   Final    BOTTLES DRAWN AEROBIC AND ANAEROBIC Blood Culture adequate volume   Culture   Final    NO GROWTH 3 DAYS Performed at [facility], [place] [address] [place], [place] [address]    Report Status PENDING  Incomplete  C Difficile Quick Screen (NO PCR Reflex)     Status: Abnormal   Collection Time: 09/23/20 11:01 AM   Specimen: STOOL  Result Value Ref Range Status   C Diff antigen POSITIVE (A) NEGATIVE Final   C Diff toxin NEGATIVE NEGATIVE Final   C Diff interpretation   Final    Results  are indeterminate. Please contact your campus specific ID physician or [place].    Comment: Performed at [facility], [place] [address], [place], [place] [address]         Radiology Studies: No results found.      Scheduled Meds: . bismuth subsalicylate  494 mg Oral BID  . doxazosin  1 mg Oral QHS  .  enoxaparin (LOVENOX) injection  70 mg Subcutaneous Q12H  . feeding supplement (ENSURE ENLIVE)  237 mL Oral BID BM  . insulin aspart  0-9 Units Subcutaneous TID WC  . metoprolol tartrate  25 mg Oral BID  . nystatin  5 mL Oral QID  . rosuvastatin  5 mg Oral QHS   Continuous Infusions:    LOS: 7 days    Time spent: 35 minutes.     Elmarie Shiley, MD [facility]   If 7PM-7AM, please contact night-coverage [email or web address]  09/25/2020, 5:02 PM

## 2020-09-25 NOTE — Progress Notes (Signed)
Physical Therapy Treatment Patient Details Name: Anna Stone MRN: [id number] DOB: 05-13-1941 Today's Date: 09/25/2020    History of Present Illness Pt is an 79 y/o female admitted secondary to progressive weakness. Found to have AKI. PMH includes DM, HTN, CKD.     PT Comments    Continuing work on functional mobility and activity tolerance;  Pt seemed in good spirits, and stood well with stedy; the bar in front of the stedy seemed to help alleviate some of her fear of falling; Will continue work on mobility and amb; hope to walk a bit next session   Follow Up Recommendations  SNF     Equipment Recommendations  Wheelchair (measurements PT);Wheelchair cushion (measurements PT)    Recommendations for Other Services       Precautions / Restrictions Precautions Precautions: Fall Precaution Comments: hx of falls    Mobility  Bed Mobility Overal bed mobility: Needs Assistance Bed Mobility: Sit to Supine       Sit to supine: Min assist;+2 for physical assistance   General bed mobility comments: Assist for help with LEs getting into bed  Transfers Overall transfer level: Needs assistance   Transfers: Sit to/from Stand Sit to Stand: Mod assist;+2 physical assistance;+2 safety/equipment         General transfer comment: Mod assist and multimodal cues to initiate with forward lean; close guard fo knees for stability, but noted good, solid effort at power up on pt's part; stood twice in teh stedy for hygeine, and to help pt back to bed  Ambulation/Gait                 Stairs             Wheelchair Mobility    Modified Rankin (Stroke Patients Only)       Balance     Sitting balance-Leahy Scale: Fair       Standing balance-Leahy Scale: Poor                              Cognition Arousal/Alertness: Awake/alert Behavior During Therapy: WFL for tasks assessed/performed;Anxious Overall Cognitive Status: Within  Functional Limits for tasks assessed                                 General Comments: Fearful of falling      Exercises      General Comments        Pertinent Vitals/Pain Pain Assessment: Faces Faces Pain Scale: Hurts a little bit Pain Location: bil feet with attempts at donning pressure stockings Pain Descriptors / Indicators: Grimacing Pain Intervention(s): Monitored during session;Limited activity within patient's tolerance    Home Living                      Prior Function            PT Goals (current goals can now be found in the care plan section) Acute Rehab PT Goals Patient Stated Goal: to be able to walk  PT Goal Formulation: With patient Time For Goal Achievement: 10/02/20 Potential to Achieve Goals: Good Progress towards PT goals: Progressing toward goals    Frequency    Min 2X/week      PT Plan Current plan remains appropriate    Co-evaluation              AM-PAC PT "6  Clicks" Mobility   Outcome Measure  Help needed turning from your back to your side while in a flat bed without using bedrails?: A Little Help needed moving from lying on your back to sitting on the side of a flat bed without using bedrails?: A Little Help needed moving to and from a bed to a chair (including a wheelchair)?: A Lot Help needed standing up from a chair using your arms (e.g., wheelchair or bedside chair)?: A Lot Help needed to walk in hospital room?: A Lot Help needed climbing 3-5 steps with a railing? : Total 6 Click Score: 13    End of Session Equipment Utilized During Treatment: Gait belt Activity Tolerance: Patient tolerated treatment well Patient left: in bed;with call bell/phone within reach;with bed alarm set;with nursing/sitter in room Nurse Communication: Mobility status PT Visit Diagnosis: Muscle weakness (generalized) (M62.81);Difficulty in walking, not elsewhere classified (R26.2)     Time: 9622-2979 PT Time  Calculation (min) (ACUTE ONLY): 25 min  Charges:  $Therapeutic Activity: 23-37 mins                     Roney Marion, PT  Acute Rehabilitation Services Pager [phone] Office [place] 09/25/2020, 5:53 PM

## 2020-09-25 NOTE — Progress Notes (Signed)
          Daily Rounding Note  09/25/2020, 12:41 PM  LOS: 7 days   SUBJECTIVE:   Chief complaint:Dr Regalado wants to know what to do about the positive C diff Ag, the C diff toxin in negative, does pt need the PCR testing Dr Linus Salmons was asked same info and deferred decision to GI.        3 BM's yesterday, 1 small, formed BM today.  Some nausea this AM.  No abd pain.  Overall feels better  OBJECTIVE:         Vital signs in last 24 hours:    Temp:  [97.5 F (36.4 C)-99 F (37.2 C)] 97.5 F (36.4 C) (10/11 0928) Pulse Rate:  [95-107] 107 (10/11 0928) Resp:  [16-19] 19 (10/11 0928) BP: (130-158)/(56-81) 130/63 (10/11 0928) SpO2:  [95 %-98 %] 97 % (10/11 0928) Weight:  [74.3 kg] 74.3 kg (10/11 0500) Last BM Date: 09/25/20 Filed Weights   09/24/20 0500 09/24/20 2104 09/25/20 0500  Weight: 74.4 kg 74.3 kg 74.3 kg   General: looks tired, not toxic   Heart: RRR Chest: no labored breathing Abdomen: soft, NT, ND.  Active BS  Extremities: no CCE.   Neuro/Psych:  Oriented x 3.  No gross weakness or tremor.    Intake/Output from previous day: 10/10 0701 - 10/11 0700 In: 900 [P.O.:900] Out: 250 [Urine:250]  Intake/Output this shift: Total I/O In: 240 [P.O.:240] Out: 300 [Urine:300]  Lab Results: Recent Labs    09/23/20 0212 09/24/20 0126 09/25/20 0859  WBC 7.9 7.2 7.3  HGB 9.5* 8.9* 10.2*  HCT 29.0* 27.7* 31.7*  PLT 193 194 213   BMET Recent Labs    09/23/20 0212 09/24/20 0126 09/25/20 0859  NA 139 139 140  K 3.8 3.8 3.7  CL 110 111 109  CO2 18* 20* 20*  GLUCOSE 87 95 132*  BUN 9 9 8   CREATININE 0.85 0.76 0.92  CALCIUM 8.1* 8.1* 8.6*   LFT No results for input(s): PROT, ALBUMIN, AST, ALT, ALKPHOS, BILITOT, BILIDIR, IBILI in the last 72 hours. PT/INR No results for input(s): LABPROT, INR in the last 72 hours. Hepatitis Panel No results for input(s): HEPBSAG, HCVAB, HEPAIGM, HEPBIGM in the last 72  hours.  Studies/Results: No results found.   Scheduled Meds: . bismuth subsalicylate  827 mg Oral TID  . doxazosin  1 mg Oral QHS  . enoxaparin (LOVENOX) injection  70 mg Subcutaneous Q12H  . feeding supplement (ENSURE ENLIVE)  237 mL Oral BID BM  . insulin aspart  0-9 Units Subcutaneous TID WC  . metoprolol tartrate  25 mg Oral BID  . nystatin  5 mL Oral QID  . rosuvastatin  5 mg Oral QHS   Continuous Infusions: PRN Meds:.acetaminophen **OR** acetaminophen, ondansetron **OR** ondansetron (ZOFRAN) IV   ASSESMENT:   *    Diarrhea. Improved.     Fecal lactoferrin positive.  PCR path panel + for Yersinia. Doxy 10/6 - 10/10.  Also on Bismuth tid.   c diff Ag and toxin negative in 08/2020 but Ag positive and toxin negative now   PLAN   *   Ordered the C diff PCR now.   Drop bismuth to bid.     Anna Stone  09/25/2020, 12:41 PM Phone [phone]

## 2020-09-26 ENCOUNTER — Encounter (HOSPITAL_COMMUNITY): Payer: Self-pay | Admitting: Internal Medicine

## 2020-09-26 DIAGNOSIS — R278 Other lack of coordination: Secondary | ICD-10-CM | POA: Diagnosis not present

## 2020-09-26 DIAGNOSIS — S199XXA Unspecified injury of neck, initial encounter: Secondary | ICD-10-CM | POA: Diagnosis not present

## 2020-09-26 DIAGNOSIS — E785 Hyperlipidemia, unspecified: Secondary | ICD-10-CM | POA: Diagnosis not present

## 2020-09-26 DIAGNOSIS — Z7401 Bed confinement status: Secondary | ICD-10-CM | POA: Diagnosis not present

## 2020-09-26 DIAGNOSIS — R2689 Other abnormalities of gait and mobility: Secondary | ICD-10-CM | POA: Diagnosis not present

## 2020-09-26 DIAGNOSIS — I10 Essential (primary) hypertension: Secondary | ICD-10-CM | POA: Diagnosis not present

## 2020-09-26 DIAGNOSIS — M7130 Other bursal cyst, unspecified site: Secondary | ICD-10-CM | POA: Diagnosis not present

## 2020-09-26 DIAGNOSIS — E118 Type 2 diabetes mellitus with unspecified complications: Secondary | ICD-10-CM | POA: Diagnosis not present

## 2020-09-26 DIAGNOSIS — W19XXXA Unspecified fall, initial encounter: Secondary | ICD-10-CM | POA: Diagnosis not present

## 2020-09-26 DIAGNOSIS — B9689 Other specified bacterial agents as the cause of diseases classified elsewhere: Secondary | ICD-10-CM | POA: Diagnosis not present

## 2020-09-26 DIAGNOSIS — N189 Chronic kidney disease, unspecified: Secondary | ICD-10-CM | POA: Diagnosis not present

## 2020-09-26 DIAGNOSIS — R279 Unspecified lack of coordination: Secondary | ICD-10-CM | POA: Diagnosis not present

## 2020-09-26 DIAGNOSIS — M47812 Spondylosis without myelopathy or radiculopathy, cervical region: Secondary | ICD-10-CM | POA: Diagnosis not present

## 2020-09-26 DIAGNOSIS — E1151 Type 2 diabetes mellitus with diabetic peripheral angiopathy without gangrene: Secondary | ICD-10-CM | POA: Diagnosis not present

## 2020-09-26 DIAGNOSIS — S098XXA Other specified injuries of head, initial encounter: Secondary | ICD-10-CM | POA: Diagnosis not present

## 2020-09-26 DIAGNOSIS — Z743 Need for continuous supervision: Secondary | ICD-10-CM | POA: Diagnosis not present

## 2020-09-26 DIAGNOSIS — S0990XA Unspecified injury of head, initial encounter: Secondary | ICD-10-CM | POA: Diagnosis not present

## 2020-09-26 DIAGNOSIS — N390 Urinary tract infection, site not specified: Secondary | ICD-10-CM | POA: Diagnosis not present

## 2020-09-26 DIAGNOSIS — F329 Major depressive disorder, single episode, unspecified: Secondary | ICD-10-CM | POA: Diagnosis not present

## 2020-09-26 DIAGNOSIS — C7490 Malignant neoplasm of unspecified part of unspecified adrenal gland: Secondary | ICD-10-CM | POA: Diagnosis not present

## 2020-09-26 DIAGNOSIS — N1831 Chronic kidney disease, stage 3a: Secondary | ICD-10-CM | POA: Diagnosis not present

## 2020-09-26 DIAGNOSIS — A046 Enteritis due to Yersinia enterocolitica: Secondary | ICD-10-CM | POA: Diagnosis not present

## 2020-09-26 DIAGNOSIS — I6782 Cerebral ischemia: Secondary | ICD-10-CM | POA: Diagnosis not present

## 2020-09-26 DIAGNOSIS — I629 Nontraumatic intracranial hemorrhage, unspecified: Secondary | ICD-10-CM | POA: Diagnosis not present

## 2020-09-26 DIAGNOSIS — R42 Dizziness and giddiness: Secondary | ICD-10-CM | POA: Diagnosis not present

## 2020-09-26 DIAGNOSIS — M19011 Primary osteoarthritis, right shoulder: Secondary | ICD-10-CM | POA: Diagnosis not present

## 2020-09-26 DIAGNOSIS — G319 Degenerative disease of nervous system, unspecified: Secondary | ICD-10-CM | POA: Diagnosis not present

## 2020-09-26 DIAGNOSIS — R197 Diarrhea, unspecified: Secondary | ICD-10-CM | POA: Diagnosis not present

## 2020-09-26 DIAGNOSIS — S0003XA Contusion of scalp, initial encounter: Secondary | ICD-10-CM | POA: Diagnosis not present

## 2020-09-26 DIAGNOSIS — N179 Acute kidney failure, unspecified: Secondary | ICD-10-CM | POA: Diagnosis not present

## 2020-09-26 DIAGNOSIS — M6281 Muscle weakness (generalized): Secondary | ICD-10-CM | POA: Diagnosis not present

## 2020-09-26 DIAGNOSIS — M2550 Pain in unspecified joint: Secondary | ICD-10-CM | POA: Diagnosis not present

## 2020-09-26 DIAGNOSIS — R0902 Hypoxemia: Secondary | ICD-10-CM | POA: Diagnosis not present

## 2020-09-26 DIAGNOSIS — K219 Gastro-esophageal reflux disease without esophagitis: Secondary | ICD-10-CM | POA: Diagnosis not present

## 2020-09-26 DIAGNOSIS — N183 Chronic kidney disease, stage 3 unspecified: Secondary | ICD-10-CM | POA: Diagnosis not present

## 2020-09-26 DIAGNOSIS — M2578 Osteophyte, vertebrae: Secondary | ICD-10-CM | POA: Diagnosis not present

## 2020-09-26 DIAGNOSIS — R262 Difficulty in walking, not elsewhere classified: Secondary | ICD-10-CM | POA: Diagnosis not present

## 2020-09-26 LAB — RESPIRATORY PANEL BY RT PCR (FLU A&B, COVID)
Influenza A by PCR: NEGATIVE
Influenza B by PCR: NEGATIVE
SARS Coronavirus 2 by RT PCR: NEGATIVE

## 2020-09-26 LAB — GLUCOSE, CAPILLARY
Glucose-Capillary: 93 mg/dL (ref 70–99)
Glucose-Capillary: 122 mg/dL — ABNORMAL HIGH (ref 70–99)

## 2020-09-26 MED ORDER — APIXABAN 5 MG PO TABS: 10.0000 mg | ORAL_TABLET | Freq: Two times a day (BID) | ORAL | Status: DC

## 2020-09-26 MED ORDER — RIVAROXABAN 15 MG PO TABS: 15.0000 mg | ORAL_TABLET | Freq: Two times a day (BID) | ORAL | Status: DC

## 2020-09-26 MED ORDER — RIVAROXABAN 15 MG PO TABS: 15.0000 mg | ORAL_TABLET | Freq: Two times a day (BID) | ORAL | 0 refills | Status: DC

## 2020-09-26 MED ORDER — APIXABAN 5 MG PO TABS
5.0000 mg | ORAL_TABLET | Freq: Two times a day (BID) | ORAL | 3 refills | Status: DC
Start: 2020-10-03 — End: 2020-09-26

## 2020-09-26 MED ORDER — PEPTO-BISMOL 524 MG/30ML PO SUSP: 30.0000 mL | ORAL | 2 refills | Status: AC | PRN

## 2020-09-26 MED ORDER — RIVAROXABAN 10 MG PO TABS: 20.0000 mg | ORAL_TABLET | Freq: Every day | ORAL | Status: DC

## 2020-09-26 MED ORDER — METOPROLOL TARTRATE 25 MG PO TABS
25.0000 mg | ORAL_TABLET | Freq: Two times a day (BID) | ORAL | 0 refills | Status: DC
Start: 2020-09-26 — End: 2022-05-30

## 2020-09-26 MED ORDER — ENSURE ENLIVE PO LIQD: 237.0000 mL | Freq: Two times a day (BID) | ORAL | 12 refills | Status: DC

## 2020-09-26 MED ORDER — NYSTATIN 100000 UNIT/ML MT SUSP: 5.0000 mL | Freq: Four times a day (QID) | OROMUCOSAL | 0 refills | Status: DC

## 2020-09-26 MED ORDER — ONDANSETRON HCL 4 MG PO TABS: 4.0000 mg | ORAL_TABLET | Freq: Four times a day (QID) | ORAL | 0 refills | Status: DC | PRN

## 2020-09-26 MED ORDER — APIXABAN 5 MG PO TABS: 10.0000 mg | ORAL_TABLET | Freq: Two times a day (BID) | ORAL | 0 refills | Status: DC

## 2020-09-26 MED ORDER — RIVAROXABAN 20 MG PO TABS
20.0000 mg | ORAL_TABLET | Freq: Every day | ORAL | 3 refills | Status: DC
Start: 2020-10-17 — End: 2023-02-16

## 2020-09-26 MED ORDER — APIXABAN 5 MG PO TABS: 5.0000 mg | ORAL_TABLET | Freq: Two times a day (BID) | ORAL | Status: DC

## 2020-09-26 NOTE — Progress Notes (Addendum)
Report given to [facility] [address], [place] @ [facility] [place] [phone] ext [phone].

## 2020-09-26 NOTE — Discharge Instructions (Addendum)
Information on my medicine - XARELTO (rivaroxaban)  This medication education was reviewed with me or my healthcare representative as part of my discharge preparation.   WHY WAS XARELTO PRESCRIBED FOR YOU? Xarelto was prescribed to treat blood clots that may have been found in the veins of your legs (deep vein thrombosis) or in your lungs (pulmonary embolism) and to reduce the risk of them occurring again.  What do you need to know about Xarelto? The starting dose is one 15 mg tablet taken TWICE daily with food for the FIRST 21 DAYS then on 10/17/20   the dose is changed to one 20 mg tablet taken ONCE A DAY with your evening meal.  DO NOT stop taking Xarelto without talking to the health care provider who prescribed the medication.  Refill your prescription for 20 mg tablets before you run out.  After discharge, you should have regular check-up appointments with your healthcare provider that is prescribing your Xarelto.  In the future your dose may need to be changed if your kidney function changes by a significant amount.  What do you do if you miss a dose? If you are taking Xarelto TWICE DAILY and you miss a dose, take it as soon as you remember. You may take two 15 mg tablets (total 30 mg) at the same time then resume your regularly scheduled 15 mg twice daily the next day.  If you are taking Xarelto ONCE DAILY and you miss a dose, take it as soon as you remember on the same day then continue your regularly scheduled once daily regimen the next day. Do not take two doses of Xarelto at the same time.   Important Safety Information Xarelto is a blood thinner medicine that can cause bleeding. You should call your healthcare provider right away if you experience any of the following: ? Bleeding from an injury or your nose that does not stop. ? Unusual colored urine (red or dark brown) or unusual colored stools (red or black). ? Unusual bruising for unknown reasons. ? A serious fall or  if you hit your head (even if there is no bleeding).  Some medicines may interact with Xarelto and might increase your risk of bleeding while on Xarelto. To help avoid this, consult your healthcare provider or pharmacist prior to using any new prescription or non-prescription medications, including herbals, vitamins, non-steroidal anti-inflammatory drugs (NSAIDs) and supplements.  This website has more information on Xarelto: [email or web address]

## 2020-09-26 NOTE — Care Management Important Message (Signed)
Important Message  Patient Details  Name: Anna Stone MRN: [id number] Date of Birth: 07/02/1941   Medicare Important Message Given:  Yes - Important Message mailed due to current National Emergency  Verbal consent obtained due to current National Emergency  Relationship to patient: Self Contact Name: Patrycja Mumpower Call Date: 09/26/20  Time: 1157 Phone: [phone] Outcome: Spoke with contact Important Message mailed to: Patient address on file    Delorse Lek 09/26/2020, 11:57 AM

## 2020-09-26 NOTE — Progress Notes (Signed)
          Daily Rounding Note  09/26/2020, 9:39 AM  LOS: 8 days   SUBJECTIVE:   Chief complaint: Infectious colitis.    Had 2 soft BMs yesterday, one soft stool today.  No nausea today.  Tolerating solid food.  Hoping she will be able to go home today.  OBJECTIVE:         Vital signs in last 24 hours:    Temp:  [98 F (36.7 C)-98.6 F (37 C)] 98.2 F (36.8 C) (10/12 0856) Pulse Rate:  [98-111] 111 (10/12 0856) Resp:  [17-18] 18 (10/12 0856) BP: (140-158)/(54-83) 140/64 (10/12 0856) SpO2:  [97 %-100 %] 100 % (10/12 0856) Last BM Date: 09/25/20 Filed Weights   09/24/20 0500 09/24/20 2104 09/25/20 0500  Weight: 74.4 kg 74.3 kg 74.3 kg   General: Looks well.  Comfortable, alert. Heart: RRR. Chest: Clear bilaterally. Abdomen: Nontender, soft, active bowel sounds. Extremities: No CCE. Neuro/Psych: Alert.  Oriented x3.  No gross weakness, deficits or speech impairment.  Intake/Output from previous day: 10/11 0701 - 10/12 0700 In: 840 [P.O.:840] Out: 1000 [Urine:1000]  Intake/Output this shift: Total I/O In: 240 [P.O.:240] Out: -   Lab Results: Recent Labs    09/24/20 0126 09/25/20 0859  WBC 7.2 7.3  HGB 8.9* 10.2*  HCT 27.7* 31.7*  PLT 194 213   BMET Recent Labs    09/24/20 0126 09/25/20 0859  NA 139 140  K 3.8 3.7  CL 111 109  CO2 20* 20*  GLUCOSE 95 132*  BUN 9 8  CREATININE 0.76 0.92  CALCIUM 8.1* 8.6*    Studies/Results: No results found.   Scheduled Meds: . bismuth subsalicylate  537 mg Oral BID  . doxazosin  1 mg Oral QHS  . enoxaparin (LOVENOX) injection  70 mg Subcutaneous Q12H  . feeding supplement (ENSURE ENLIVE)  237 mL Oral BID BM  . insulin aspart  0-9 Units Subcutaneous TID WC  . metoprolol tartrate  25 mg Oral BID  . nystatin  5 mL Oral QID  . rosuvastatin  5 mg Oral QHS   Continuous Infusions: PRN Meds:.acetaminophen **OR** acetaminophen, ondansetron **OR** ondansetron  (ZOFRAN) IV   ASSESMENT:   *   Diarrhea.  Completed 5 d Doxy for path panel + for Yersinia C diff Ag +, C diff toxin negative.  Cdiff PCR ordered 10/11.   Aged out of screening colonoscopies,  No hx of polyps on a regular studies.  Last colon was >10 yrs ago.     PLAN   *  Collect stool for PCR, the add-on test from yesterday was not run.   If no specimen "produced" by pt in next 4 hours than she can discharge without collecting specimen.  Given improvement in diarrhea, active C diff is unlikely.   Stop Bismuth.    *  Her GI MD is Dr Odie Sera in [place], can fup there if needed    Azucena Freed  09/26/2020, 9:39 AM Phone [phone]

## 2020-09-26 NOTE — TOC Transition Note (Signed)
Transition of Care (TOC) - CM/SW Discharge Note *Discharged to [facility] Number to Call Report - [phone], ext. [phone]; room 704   Patient Details  Name: Anna Stone MRN: [id number] Date of Birth: February 16, 1941  [facility] CM/SW Contact:  Sable Feil, LCSW Phone Number: 09/26/2020, 2:15 PM   Clinical Narrative:  Patient medically stable for discharge and going to [facility] for ST rehab. Visited with daughter and son earlier today regarding discharge. Facility admissions director Olivia Mackie contacted regarding patient's readiness for discharge and d/c clinicals transmitted to facility. Ms. Cragin will be transported by non-emergency ambulance to facility.     Final next level of care: [facility] ([facility]) Barriers to Discharge: Barriers Resolved   Patient Goals and CMS Choice Patient states their goals for this hospitalization and ongoing recovery are:: Patient agreeable to ST rehab before returning home CMS Medicare.gov Compare Post Acute Care list provided to:: Patient Choice offered to / list presented to : Patient  Discharge Placement PASRR number recieved: 09/19/20            Patient chooses bed at: [facility], [place] Patient to be transferred to facility by: Non-emergency ambulance transport Name of family member notified: Son [name] Patient and family notified of of transfer: 09/26/20  Discharge Plan and Services In-house Referral: Clinical Social Work   Post Acute Care Choice: [facility]                              Social Determinants of Health ([place]) Interventions  No SDOH interventions requested or needed at discharge   Readmission Risk Interventions No flowsheet data found.

## 2020-09-26 NOTE — Progress Notes (Signed)
Occupational Therapy Treatment Patient Details Name: Anna Stone MRN: [id number] DOB: Jan 14, 1941 Today's Date: 09/26/2020    History of present illness Pt is an 79 y/o female admitted secondary to progressive weakness. Found to have AKI. PMH includes DM, HTN, CKD.    OT comments  Patient agreeable to OT.  Updated dc plan to SNF.  Mod assist for bed mobility, mod assist for transfers using RW and mod assist side stepping to [employer], total assist for LB ADLs, setup for grooming seated. Mild dizziness upon sitting, fades with sustained siting.  Fatigues easily.  Remains highly fearful of falling, requires increased encouragement.  Will follow.    Follow Up Recommendations  SNF;Supervision/Assistance - 24 hour    Equipment Recommendations  Other (comment) (TBD at next venue of care )    Recommendations for Other Services      Precautions / Restrictions Precautions Precautions: Fall Precaution Comments: hx of falls Restrictions Weight Bearing Restrictions: No       Mobility Bed Mobility Overal bed mobility: Needs Assistance Bed Mobility: Supine to Sit;Sit to Supine     Supine to sit: Mod assist Sit to supine: Mod assist   General bed mobility comments: most assist for trunk to ascend to EOB, returned to supine with B LE support  Transfers Overall transfer level: Needs assistance Equipment used: Rolling walker (2 wheeled) Transfers: Sit to/from Stand Sit to Stand: Mod assist         General transfer comment: mod assist to power up and steady, cueing for hand placement and positioning (but pulling BUE on RW)    Balance Overall balance assessment: Needs assistance Sitting-balance support: No upper extremity supported;Feet supported Sitting balance-Leahy Scale: Fair Sitting balance - Comments: supervision for safety    Standing balance support: Bilateral upper extremity supported;During functional activity Standing balance-Leahy Scale: Poor Standing  balance comment: relies on BUE and external support                           ADL either performed or assessed with clinical judgement   ADL Overall ADL's : Needs assistance/impaired     Grooming: Wash/dry face;Set up;Sitting               Lower Body Dressing: Total assistance;Sit to/from stand Lower Body Dressing Details (indicate cue type and reason): assist to don socks, mod assist sit to stand              Functional mobility during ADLs: Moderate assistance;Rolling walker General ADL Comments: pt limited by weakness, fear of falling, decreased activity tolerance      Vision       Perception     Praxis      Cognition Arousal/Alertness: Awake/alert Behavior During Therapy: WFL for tasks assessed/performed;Anxious Overall Cognitive Status: Within Functional Limits for tasks assessed                                 General Comments: highly fearful of falling         Exercises     Shoulder Instructions       General Comments      Pertinent Vitals/ Pain       Pain Assessment: Faces Faces Pain Scale: No hurt  Home Living  Prior Functioning/Environment              Frequency  Min 2X/week        Progress Toward Goals  OT Goals(current goals can now be found in the care plan section)  Progress towards OT goals: Progressing toward goals  Acute Rehab OT Goals Patient Stated Goal: to be able to walk  OT Goal Formulation: With patient  Plan Discharge plan needs to be updated;Frequency remains appropriate    Co-evaluation                 AM-PAC OT "6 Clicks" Daily Activity     Outcome Measure   Help from another person eating meals?: A Little Help from another person taking care of personal grooming?: A Little Help from another person toileting, which includes using toliet, bedpan, or urinal?: Total Help from another person bathing (including  washing, rinsing, drying)?: A Lot Help from another person to put on and taking off regular upper body clothing?: A Little Help from another person to put on and taking off regular lower body clothing?: Total 6 Click Score: 13    End of Session Equipment Utilized During Treatment: Gait belt;Rolling walker  OT Visit Diagnosis: Other abnormalities of gait and mobility (R26.89);Muscle weakness (generalized) (M62.81)   Activity Tolerance Patient tolerated treatment well   Patient Left in bed;with bed alarm set;with call bell/phone within reach   Nurse Communication Mobility status        Time: 1151-1209 OT Time Calculation (min): 18 min  Charges: OT General Charges $OT Visit: 1 Visit OT Treatments $Self Care/Home Management : 8-22 mins  Jolaine Artist, OT [place] Pager [phone] Office [phone]    Delight Stare 09/26/2020, 12:55 PM

## 2020-09-26 NOTE — Progress Notes (Addendum)
ANTICOAGULATION CONSULT NOTE - Initial Consult  Pharmacy Consult for Lovenox>>Apixaban Indication: DVT  Allergies  Allergen Reactions  . Sulfa Antibiotics Itching and Rash  . Adhesive [Tape] Dermatitis    Blisters the skin    Patient Measurements: Height: 5\' 2"  (157.5 cm) Weight: 74.3 kg (163 lb 12.8 oz) IBW/kg (Calculated) : 50.1  Vital Signs: Temp: 98.2 F (36.8 C) (10/12 0856) Temp Source: Oral (10/12 0600) BP: 140/64 (10/12 0856) Pulse Rate: 107 (10/12 0856)  Labs: Recent Labs    09/24/20 0126 09/25/20 0859  HGB 8.9* 10.2*  HCT 27.7* 31.7*  PLT 194 213  CREATININE 0.76 0.92    Estimated Creatinine Clearance: 45.3 mL/min (by C-G formula based on SCr of 0.92 mg/dL).   Medical History: Past Medical History:  Diagnosis Date  . Cancer ([place])   . Chronic kidney disease    stage 3  . Depression   . Diabetes mellitus without complication ([place])   . Difficulty breathing   . GERD (gastroesophageal reflux disease)   . Hypertension   . IBS (irritable bowel syndrome)   . Muscle pain   . Palpitation   . PONV (postoperative nausea and vomiting)   . Swelling     Medications:  Scheduled:  . doxazosin  1 mg Oral QHS  . enoxaparin (LOVENOX) injection  70 mg Subcutaneous Q12H  . feeding supplement (ENSURE ENLIVE)  237 mL Oral BID BM  . insulin aspart  0-9 Units Subcutaneous TID WC  . metoprolol tartrate  25 mg Oral BID  . nystatin  5 mL Oral QID  . rosuvastatin  5 mg Oral QHS    Assessment: 79 years of age female with LE venous ultrasound positive for age indeterminate DVT in left femoral, popliteal, posterior tibial, and peroneal veins. CT Angio is pending to evaluate for pulmonary embolism. Pharmacy convert from lovenox to apixaban for DVT  Last dose of therapeutic lovenox given at ~0600 this AM. First dose of apixaban should be at 1800 tonight.   Goal of Therapy:  Monitor platelets by anticoagulation protocol: Yes   Plan:  D/C lovenox Start Apixaban 10 mg  PO BID x 7 days starting at 1800 tonight.  Monitor CBC, renal function, and for any signs of bleeding. On 10/19 in PM, decrease to 5mg  BID  Huey Scalia A. Levada Dy, PharmD, BCPS, FNKF Clinical Pharmacist [place] Please utilize [facility] for appropriate phone number to reach the unit pharmacist ([place])  Addendum: Patient unable to start apixaban due to insurance coverage. Will change to rivaroxaban per MD. Start 15mg  BID tonight for 3 weeks then decrease to 20mg  daily.    09/26/2020,12:09 PM

## 2020-09-26 NOTE — Discharge Summary (Addendum)
Physician Discharge Summary  Dexter Sauser [place] NLG:[id number] DOB: [birth year] DOA: 09/17/2020  PCP: Marco Collie, MD  Admit date: 09/17/2020 Discharge date: 09/26/2020  Admitted From: Home  Disposition:  SNF  Recommendations for Outpatient Follow-up:  1. Follow up with PCP in 1-2 weeks 2. Please obtain BMP/CBC in one week 3. Needs appropriate screening for malignancy due to new DVT 4. If rash persist, will need referral to Dermatologist.  5. Needs repeat ESR after resolution of GI illness.  6. Evaluate volume status lasix PRN 7. Evaluation by pulmonologist for ground glass opacity eval.     Discharge Condition: Stable.  CODE STATUS: DNR Diet recommendation: Heart Healthy   Brief/Interim Summary: 79 year old with past medical history significant for diabetes, CKD stage IIIa, hypertension, hyperlipidemia who presents to the emergency room for evaluation of generalized weakness.  Patient was recently admitted in September for management of sepsis due to sigmoid colitis, she was also found to have AKI at that time was treated with IV antibiotics and fluids.  She was recommended a skilled nursing facility on discharge but she did declined and went home.  Since she has been at home she been feeling weak, unable to ambulate.  She also complained of diarrhea and decreased oral intake. On admission she was found to have AKI, started on IV fluids.  PT OT recommending skilled nursing facility.  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 She was started on Doxycycline. Completed Doxy.   1-AKI on CKD stage IIIa: Secondary to prerenal, poor oral intake diuretics ACE and ARB. Creatinine baseline 1.2-1.4 Continue to hold diuretics. Metabolic acidosis, likely related to diarrhea. Monitor renal function off IV fluids.  improved.   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  Recently  treated for colitis. Lactoferrin positive GI; growing yersinia enterocolitica.  Completed 5 days of  Doxycycline.  Appreciate GI assistance, started on Pepto, which help.  Change pepto bismol to PRN.  Had form stool, eating more.  No need for C diff PCR. No diarrhea.   4-Hyperkalemia;  Resolved.   5-Rash LE, purpuric  ESR elevated 100 , C ANCA, ANA< negative.  No thrombocytopenia.  DIC negative.  Might be related to GI infection.   Left LE DVT, femoral, popliteal, tibial and peroneal: Elevated D dimer; Tachycardia.  Continue with Lovenox. Plan to transition to [place] today prior to discharge.  Doppler positive for DVT . Needs 3 to 6 months. Follow up with PCP for appropriate  screening for malignancy.  Might be related to immobility.   CT angio negative for PE>BL multifocal areas of ground glass attenuation compatible with small airway diseases. Needs follow up by pulmonologist. Husband aware.   Hypotension;  BP on hold initially. Resolved.    HTN; Continue with metoprolol. Holding Norvasc and ACE due to dehydration.   DM; type 2; SSI, she has not required Insulin. Monitor cbg.   Left adrenal mass; Needs follow up and work up  Hypomagnesemia; replaced. needs mg level in 2 days.   Discharge Diagnoses:  Principal Problem:   Acute kidney injury superimposed on CKD ([place]) Active Problems:   Type 2 diabetes mellitus with stage 3 chronic kidney disease ([place])   Hypertension associated with diabetes ([place])   Left adrenal mass ([facility])   Generalized weakness   Hyperlipidemia associated with type 2 diabetes mellitus ([place])   Diarrhea of presumed infectious origin   Enteritis, Yersinia enterocolitica    Discharge Instructions  Discharge Instructions    Diet - low sodium heart healthy   Complete by: As directed    Discharge wound care:   Complete by: As directed    See above   Increase activity slowly   Complete by: As directed       Allergies as of 09/26/2020      Reactions   Sulfa Antibiotics Itching, Rash   Adhesive [tape] Dermatitis   Blisters the skin      Medication List    STOP taking these medications   amLODipine 10 MG tablet Commonly known as: NORVASC   furosemide 20 MG tablet Commonly known as: LASIX   lisinopril 20 MG tablet Commonly known as: ZESTRIL   losartan 25 MG tablet Commonly known as: COZAAR   rOPINIRole 2 MG tablet Commonly known as: REQUIP   spironolactone 50 MG tablet Commonly known as: ALDACTONE     TAKE these medications   doxazosin 1 MG tablet Commonly known as: CARDURA Take 1 mg by mouth at bedtime.   feeding supplement (ENSURE ENLIVE) Liqd Take 237 mLs by mouth 2 (two) times daily between meals.   fluticasone 50 MCG/ACT nasal spray Commonly known as: FLONASE Place 2 sprays into both nostrils daily as needed for allergies.   gabapentin 600 MG tablet Commonly known as: NEURONTIN Take 600 mg by mouth 2 (two) times daily.   metoprolol tartrate 25 MG tablet Commonly known as: LOPRESSOR Take 1 tablet (25 mg total) by mouth 2 (two) times daily.   nystatin 100000 UNIT/ML suspension Commonly known as: MYCOSTATIN Take 5 mLs (500,000 Units total) by mouth 4 (four) times daily.   omeprazole 20 MG capsule Commonly known as: PRILOSEC Take 40 mg by mouth daily.   ondansetron 4 MG tablet Commonly known as: ZOFRAN Take 1 tablet (4 mg total) by mouth every 6 (six) hours as needed for nausea.   Pepto-Bismol 262 MG/15ML suspension Generic drug: bismuth subsalicylate Take 30 mLs by mouth every 4 (four) hours as needed for diarrhea or loose stools.   Rivaroxaban 15 MG Tabs tablet Commonly known as: XARELTO Take 1 tablet (15 mg total) by mouth 2 (two) times daily with a meal for 21 days.   rivaroxaban 20 MG Tabs tablet Commonly known as: XARELTO Take 1 tablet (20 mg total) by mouth daily with supper. Start taking on: October 17, 2020   rosuvastatin 5 MG  tablet Commonly known as: CRESTOR Take 5 mg by mouth at bedtime.            Discharge Care Instructions  (From admission, onward)         Start     Ordered   09/26/20 0000  Discharge wound care:       Comments: See above   09/26/20 1115          Allergies  Allergen Reactions  . Sulfa Antibiotics Itching and Rash  . Adhesive [Tape] Dermatitis    Blisters the skin    Consultations:  ID  GI   Procedures/Studies: CT ABDOMEN PELVIS WO CONTRAST  Result Date: 09/17/2020 CLINICAL DATA:  Constipation and abdominal pain. EXAM: CT ABDOMEN AND PELVIS WITHOUT CONTRAST TECHNIQUE: Multidetector CT imaging of the abdomen and pelvis was performed following the standard protocol without IV contrast. COMPARISON:  August 31, 2020 FINDINGS: Lower chest: Mild hazy areas of atelectasis and/or early infiltrate are seen within the bilateral lung bases. Hepatobiliary: A stable, approximately 2.4 cm focus of parenchymal low attenuation is seen within the anterior aspect of the  left lobe of the liver. Status post cholecystectomy. No biliary dilatation. Pancreas: Unremarkable. No pancreatic ductal dilatation or surrounding inflammatory changes. Spleen: Normal in size without focal abnormality. Adrenals/Urinary Tract: The right adrenal gland is normal in size. A stable 9 mm low-attenuation left adrenal mass is seen. Kidneys are normal, without renal calculi, focal lesion, or hydronephrosis. Bladder is unremarkable. Stomach/Bowel: Stomach is within normal limits. The appendix is not clearly identified. No evidence of bowel wall thickening, distention, or inflammatory changes. A large stool burden is seen. Vascular/Lymphatic: There is moderate severity calcification of the abdominal aorta and bilateral common iliac arteries, without evidence of aneurysmal dilatation. No enlarged abdominal or pelvic lymph nodes. Reproductive: Uterus and bilateral adnexa are unremarkable. Other: No abdominal wall hernia or  abnormality. No abdominopelvic ascites. Musculoskeletal: Multilevel degenerative changes are noted throughout the lumbar spine IMPRESSION: 1. Mild hazy bibasilar atelectasis and/or early infiltrate. 2. Stable hepatic cyst. 3. Stable 9 mm low-attenuation left adrenal mass which may represent an adrenal adenoma. 4. Large stool burden. 5. Aortic atherosclerosis. Aortic Atherosclerosis (ICD10-I70.0). Electronically Signed   By: Virgina Norfolk M.D.   On: 09/17/2020 20:13   CT ABDOMEN PELVIS WO CONTRAST  Result Date: 08/31/2020 CLINICAL DATA:  Acute abdominal pain, nonlocalized. White blood cell count 18 EXAM: CT ABDOMEN AND PELVIS WITHOUT CONTRAST TECHNIQUE: Multidetector CT imaging of the abdomen and pelvis was performed following the standard protocol without IV contrast. COMPARISON:  CT abdomen pelvis 07/25/2014 FINDINGS: Lower chest: Pulmonary micronodule within the lingula. Bibasilar subsegmental atelectasis. Coronary artery calcifications. No significant pericardial effusion. Hepatobiliary: Similar appearing 4.4 cm fluid density lesion within the left hepatic lobe likely represents a simple hepatic cyst. No new focal liver abnormality is seen. Status post cholecystectomy. No biliary dilatation. Pancreas: Unremarkable. No pancreatic ductal dilatation or surrounding inflammatory changes. Spleen: Normal in size without focal abnormality. Adrenals/Urinary Tract: Similar-appearing 9 mm hypodense nodule within left adrenal gland consistent with adrenal adenoma. Otherwise adrenals are unremarkable. Bilateral renal cortical scarring. No renal calculi, focal lesion, or hydronephrosis. No hydroureter or ureterolithiasis. The urinary bladder is unremarkable. Stomach/Bowel: Stomach is within normal limits. The appendix is not definitely identified. No evidence of bowel wall thickening, distention, or inflammatory changes. The colon is under distended. There pericolonic fat stranding surrounding the sigmoid colon. No  diverticula identified. The remainder of the colon is unremarkable. No pneumatosis. Vascular/Lymphatic: No abdominal aorta or iliac aneurysm. Moderate atherosclerotic plaque of the aorta and its branches. No abdominal, pelvic, or inguinal lymphadenopathy. Reproductive: Status post hysterectomy. No adnexal masses. Other: No intraperitoneal free fluid. No intraperitoneal free gas. No organized fluid collection. Musculoskeletal: No abdominal wall hernia or abnormality. Nonspecific coarse calcifications of the visualized breasts. No suspicious lytic or blastic osseous lesions. No acute displaced fracture. Multilevel degenerative changes of the spine. IMPRESSION: 1. Mild inflammatory changes of the sigmoid colon with no diverticula to suggest diverticulitis. Findings likely represent colitis. Etiology may be infectious, inflammatory, ischemic. 2.  Aortic Atherosclerosis (ICD10-I70.0). Electronically Signed   By: Iven Finn M.D.   On: 08/31/2020 22:10   CT ANGIO CHEST PE W OR WO CONTRAST  Result Date: 09/22/2020 CLINICAL DATA:  Evaluate for pulmonary embolus. Chest pain and weakness. EXAM: CT ANGIOGRAPHY CHEST WITH CONTRAST TECHNIQUE: Multidetector CT imaging of the chest was performed using the standard protocol during bolus administration of intravenous contrast. Multiplanar CT image reconstructions and MIPs were obtained to evaluate the vascular anatomy. CONTRAST:  58mL OMNIPAQUE IOHEXOL 350 MG/ML SOLN COMPARISON:  02/19/2019. FINDINGS: Cardiovascular: The main pulmonary artery  appears patent. No central obstructing embolus identified bilaterally. There are no signs of lobar or segmental pulmonary artery embolus. Heart size within normal limits. No pericardial effusion. Aortic atherosclerosis and coronary artery atherosclerotic calcifications identified. Mediastinum/Nodes: No enlarged mediastinal, hilar, or axillary lymph nodes. Thyroid gland, trachea, and esophagus demonstrate no significant findings.  Lungs/Pleura: There are small bilateral pleural effusions. Subsegmental atelectasis noted within the medial lung bases. Bilateral, multifocal areas of ground-glass attenuation and air trapping identified. No airspace consolidation identified. Within the anteromedial right lower lobe there is a perifissural nodule measuring 5 mm, image 70/6. Unchanged from previous exam. Upper Abdomen: No acute abnormality noted within the imaged portions of the upper abdomen. Cyst noted within lateral segment of left hepatic lobe. Small to moderate hiatal hernia. Signs of previous cholecystectomy. Partially exophytic structure arising from segment 5 measures 3.2 cm and 24 Hounsfield units. This is unchanged when compared with 02/19/2019. Musculoskeletal: No acute osseous findings. Review of the MIP images confirms the above findings. IMPRESSION: 1. No evidence for acute pulmonary embolus. 2. Small bilateral pleural effusions. 3. Bilateral, multifocal areas of ground-glass attenuation and air trapping compatible with small airways disease. 4. 5 mm perifissural nodule in the right lower lobe is unchanged from 02/19/2019 and likely represents a benign abnormality. 5. Coronary artery atherosclerotic calcifications. 6. Hiatal hernia. Aortic Atherosclerosis (ICD10-I70.0). Electronically Signed   By: Kerby Moors M.D.   On: 09/22/2020 11:01   DG CHEST PORT 1 VIEW  Result Date: 09/21/2020 CLINICAL DATA:  Weakness. EXAM: PORTABLE CHEST 1 VIEW COMPARISON:  03/23/2016. FINDINGS: Mediastinum hilar structures normal. Stable cardiomegaly. No pulmonary venous congestion. No focal infiltrate. No pleural effusion or pneumothorax. No acute bony abnormality. IMPRESSION: Stable cardiomegaly. No pulmonary venous congestion. No acute pulmonary disease identified. Electronically Signed   By: Marcello Moores  Register   On: 09/21/2020 08:56   DG Chest Portable 1 View  Result Date: 08/31/2020 CLINICAL DATA:  Possible COVID EXAM: PORTABLE CHEST 1 VIEW  COMPARISON:  CT 02/19/2019, radiograph 03/23/2016 FINDINGS: Coarsened interstitial changes and bronchitic features are similar to comparison radiography. No consolidation, features of edema, pneumothorax, or effusion. The aorta is calcified. The remaining cardiomediastinal contours are unremarkable. No acute osseous or soft tissue abnormality. Degenerative changes are present in the imaged spine and shoulders. Chronic elevation of the distal left clavicle relative to the acromion may reflect remote [facility] joint injury. IMPRESSION: 1. No acute cardiopulmonary findings. 2. Chronic interstitial and bronchitic features. Electronically Signed   By: Lovena Le M.D.   On: 08/31/2020 15:53   ECHOCARDIOGRAM COMPLETE  Result Date: 09/19/2020    ECHOCARDIOGRAM REPORT   Patient Name:   [facility] Date of Exam: 09/19/2020 Medical Rec #:  [id number]                    Height:       62.0 in Accession #:    [id number]                   Weight:       165.1 lb Date of Birth:  1941-11-12                    BSA:          1.762 m Patient Age:    27 years                     BP:           153/87  mmHg Patient Gender: F                            HR:           102 bpm. Exam Location:  Inpatient Procedure: 2D Echo, Cardiac Doppler, Color Doppler and Intracardiac            Opacification Agent Indications:    Congestive Heart Failure 428.0 / I50.9  History:        Patient has no prior history of Echocardiogram examinations.                 Risk Factors:Hypertension, Diabetes and Non-Smoker. GERD.  Sonographer:    Vickie Epley RDCS Referring Phys: [id number] AMRIT ADHIKARI IMPRESSIONS  1. Left ventricular ejection fraction, by estimation, is 65 to 70%. The left ventricle has normal function. The left ventricle has no regional wall motion abnormalities. Left ventricular diastolic parameters are consistent with Grade I diastolic dysfunction (impaired relaxation). Elevated left ventricular end-diastolic pressure.  2. Right  ventricular systolic function is normal. The right ventricular size is mildly enlarged. There is normal pulmonary artery systolic pressure.  3. The mitral valve is normal in structure. No evidence of mitral valve regurgitation. No evidence of mitral stenosis.  4. The aortic valve is tricuspid. Aortic valve regurgitation is not visualized. No aortic stenosis is present.  5. The inferior vena cava is normal in size with greater than 50% respiratory variability, suggesting right atrial pressure of 3 mmHg. FINDINGS  Left Ventricle: Left ventricular ejection fraction, by estimation, is 65 to 70%. The left ventricle has normal function. The left ventricle has no regional wall motion abnormalities. Definity contrast agent was given IV to delineate the left ventricular  endocardial borders. The left ventricular internal cavity size was normal in size. There is no left ventricular hypertrophy. Left ventricular diastolic parameters are consistent with Grade I diastolic dysfunction (impaired relaxation). Elevated left ventricular end-diastolic pressure. Right Ventricle: The right ventricular size is mildly enlarged. No increase in right ventricular wall thickness. Right ventricular systolic function is normal. There is normal pulmonary artery systolic pressure. The tricuspid regurgitant velocity is 2.52  m/s, and with an assumed right atrial pressure of 3 mmHg, the estimated right ventricular systolic pressure is 69.6 mmHg. Left Atrium: Left atrial size was normal in size. Right Atrium: Right atrial size was normal in size. Pericardium: There is no evidence of pericardial effusion. Mitral Valve: The mitral valve is normal in structure. No evidence of mitral valve regurgitation. No evidence of mitral valve stenosis. Tricuspid Valve: The tricuspid valve is normal in structure. Tricuspid valve regurgitation is mild . No evidence of tricuspid stenosis. Aortic Valve: The aortic valve is tricuspid. Aortic valve regurgitation is not  visualized. No aortic stenosis is present. Pulmonic Valve: The pulmonic valve was normal in structure. Pulmonic valve regurgitation is not visualized. No evidence of pulmonic stenosis. Aorta: The aortic root is normal in size and structure. Venous: The inferior vena cava is normal in size with greater than 50% respiratory variability, suggesting right atrial pressure of 3 mmHg. IAS/Shunts: No atrial level shunt detected by color flow Doppler.  LEFT VENTRICLE PLAX 2D LVIDd:         4.20 cm     Diastology LVIDs:         2.70 cm     LV e' medial:    4.24 cm/s LV PW:         0.70 cm  LV E/e' medial:  17.7 LV IVS:        0.70 cm     LV e' lateral:   7.62 cm/s LVOT diam:     2.00 cm     LV E/e' lateral: 9.8 LV SV:         56 LV SV Index:   32 LVOT Area:     3.14 cm  LV Volumes (MOD) LV vol d, MOD A2C: 65.8 ml LV vol d, MOD A4C: 78.4 ml LV vol s, MOD A2C: 16.0 ml LV vol s, MOD A4C: 22.9 ml LV SV MOD A2C:     49.8 ml LV SV MOD A4C:     78.4 ml LV SV MOD BP:      54.1 ml RIGHT VENTRICLE RV S prime:     14.90 cm/s TAPSE (M-mode): 1.6 cm LEFT ATRIUM           Index      RIGHT ATRIUM          Index LA diam:      3.00 cm 1.70 cm/m RA Area:     5.50 cm LA Vol (A2C): 16.5 ml 9.36 ml/m RA Volume:   7.09 ml  4.02 ml/m LA Vol (A4C): 14.3 ml 8.11 ml/m  AORTIC VALVE LVOT Vmax:   120.00 cm/s LVOT Vmean:  78.300 cm/s LVOT VTI:    0.177 m  AORTA Ao Root diam: 2.70 cm MITRAL VALVE               TRICUSPID VALVE MV Area (PHT): 4.36 cm    TR Peak grad:   25.4 mmHg MV Decel Time: 174 msec    TR Vmax:        252.00 cm/s MV E velocity: 75.00 cm/s MV A velocity: 96.00 cm/s  SHUNTS MV E/A ratio:  0.78        Systemic VTI:  0.18 m                            Systemic Diam: 2.00 cm Skeet Latch MD Electronically signed by Skeet Latch MD Signature Date/Time: 09/19/2020/12:19:52 PM    Final    VAS [place] LOWER EXTREMITY VENOUS (DVT)  Result Date: 09/22/2020  Lower Venous DVTStudy Indications: Bilateral edema and pain in both legs.   Comparison Study: No prior. Performing Technologist: Oda Cogan RDMS, RVT  Examination Guidelines: A complete evaluation includes B-mode imaging, spectral Doppler, color Doppler, and power Doppler as needed of all accessible portions of each vessel. Bilateral testing is considered an integral part of a complete examination. Limited examinations for reoccurring indications may be performed as noted. The reflux portion of the exam is performed with the patient in reverse Trendelenburg.  +---------+---------------+---------+-----------+----------+--------------+ RIGHT    CompressibilityPhasicitySpontaneityPropertiesThrombus Aging +---------+---------------+---------+-----------+----------+--------------+ CFV      Full           Yes      Yes                                 +---------+---------------+---------+-----------+----------+--------------+ SFJ      Full                                                        +---------+---------------+---------+-----------+----------+--------------+  FV Prox  Full                                                        +---------+---------------+---------+-----------+----------+--------------+ FV Mid   Full                                                        +---------+---------------+---------+-----------+----------+--------------+ FV DistalFull                                                        +---------+---------------+---------+-----------+----------+--------------+ PFV      Full                                                        +---------+---------------+---------+-----------+----------+--------------+ POP      Full           Yes      Yes                                 +---------+---------------+---------+-----------+----------+--------------+ PTV      Full                                                        +---------+---------------+---------+-----------+----------+--------------+ PERO      Full                                                        +---------+---------------+---------+-----------+----------+--------------+   +---------+---------------+---------+-----------+----------+-----------------+ LEFT     CompressibilityPhasicitySpontaneityPropertiesThrombus Aging    +---------+---------------+---------+-----------+----------+-----------------+ CFV      Full           No       Yes                                    +---------+---------------+---------+-----------+----------+-----------------+ SFJ      Full                                                           +---------+---------------+---------+-----------+----------+-----------------+ FV Prox  Full                                                           +---------+---------------+---------+-----------+----------+-----------------+  FV Mid   Partial                                                        +---------+---------------+---------+-----------+----------+-----------------+ FV DistalPartial        No       No                   Age Indeterminate +---------+---------------+---------+-----------+----------+-----------------+ PFV      Full                                                           +---------+---------------+---------+-----------+----------+-----------------+ POP      None           No       No                   Age Indeterminate +---------+---------------+---------+-----------+----------+-----------------+ PTV      Partial                                      Age Indeterminate +---------+---------------+---------+-----------+----------+-----------------+ PERO     Partial                                      Age Indeterminate +---------+---------------+---------+-----------+----------+-----------------+     Summary: RIGHT: - There is no evidence of deep vein thrombosis in the lower extremity.  - No cystic structure found in the popliteal fossa.   LEFT: - Findings consistent with age indeterminate deep vein thrombosis involving the left femoral vein, left popliteal vein, left posterior tibial veins, and left peroneal veins.  *See table(s) above for measurements and observations. Electronically signed by Lemar Livings MD on 09/22/2020 at 5:30:29 PM.    Final     Subjective: She is feeling better, eating more. No diarrhea, had form stool.    Discharge Exam: Vitals:   09/26/20 0600 09/26/20 0856  BP: (!) 158/77 140/64  Pulse: (!) 107 (!) 107  Resp: 18 18  Temp: 98 F (36.7 C) 98.2 F (36.8 C)  SpO2: 97% 100%     General: Pt is alert, awake, not in acute distress Cardiovascular: RRR, S1/S2 +, no rubs, no gallops Respiratory: CTA bilaterally, no wheezing, no rhonchi Abdominal: Soft, NT, ND, bowel sounds + Extremities: no edema, no cyanosis    The results of significant diagnostics from this hospitalization (including imaging, microbiology, ancillary and laboratory) are listed below for reference.     Microbiology: Recent Results (from the past 240 hour(s))  Blood culture (routine x 2)     Status: None   Collection Time: 09/17/20 11:20 PM   Specimen: BLOOD  Result Value Ref Range Status   Specimen Description BLOOD RIGHT ANTECUBITAL  Final   Special Requests   Final    BOTTLES DRAWN AEROBIC AND ANAEROBIC Blood Culture results may not be optimal due to an inadequate volume of blood received in culture bottles   Culture   Final    NO GROWTH 5 DAYS Performed  at [facility], [place] [address], [place], [place] [address]    Report Status 09/22/2020 FINAL  Final  Blood culture (routine x 2)     Status: None   Collection Time: 09/17/20 11:20 PM   Specimen: BLOOD RIGHT HAND  Result Value Ref Range Status   Specimen Description BLOOD RIGHT HAND  Final   Special Requests   Final    BOTTLES DRAWN AEROBIC AND ANAEROBIC Blood Culture adequate volume   Culture   Final    NO GROWTH 5 DAYS Performed at [place], [place] [address], [place], [place] [address]    Report Status 09/22/2020 FINAL  Final  Respiratory Panel by RT PCR (Flu A&B, Covid) - Nasopharyngeal Swab     Status: None   Collection Time: 09/17/20 11:31 PM   Specimen: Nasopharyngeal Swab  Result Value Ref Range Status   SARS Coronavirus 2 by RT PCR NEGATIVE NEGATIVE Final    Comment: (NOTE) SARS-CoV-2 target nucleic acids are NOT DETECTED.  The SARS-CoV-2 RNA is generally detectable in upper respiratoy specimens during the acute phase of infection. The lowest concentration of SARS-CoV-2 viral copies this assay can detect is 131 copies/mL. A negative result does not preclude SARS-Cov-2 infection and should not be used as the sole basis for treatment or other patient management decisions. A negative result may occur with  improper specimen collection/handling, submission of specimen other than nasopharyngeal swab, presence of viral mutation(s) within the areas targeted by this assay, and inadequate number of viral copies (<131 copies/mL). A negative result must be combined with clinical observations, patient history, and epidemiological information. The expected result is Negative.  Fact Sheet for Patients:  PinkCheek.be  Fact Sheet for Healthcare Providers:  GravelBags.it  This test is no t yet approved or cleared by the [place] FDA and  has been authorized for detection and/or diagnosis of SARS-CoV-2 by FDA under an Emergency Use Authorization (EUA). This EUA will remain  in effect (meaning this test can be used) for the duration of the COVID-19 declaration under Section 564(b)(1) of the Act, 21 U.S.C. section 360bbb-3(b)(1), unless the authorization is terminated or revoked sooner.     Influenza A by PCR NEGATIVE NEGATIVE Final   Influenza B by PCR NEGATIVE NEGATIVE Final    Comment: (NOTE) The Xpert Xpress SARS-CoV-2/FLU/RSV assay is intended as an aid in  the  diagnosis of influenza from Nasopharyngeal swab specimens and  should not be used as a sole basis for treatment. Nasal washings and  aspirates are unacceptable for Xpert Xpress SARS-CoV-2/FLU/RSV  testing.  Fact Sheet for Patients: PinkCheek.be  Fact Sheet for Healthcare Providers: GravelBags.it  This test is not yet approved or cleared by the [place] FDA and  has been authorized for detection and/or diagnosis of SARS-CoV-2 by  FDA under an Emergency Use Authorization (EUA). This EUA will remain  in effect (meaning this test can be used) for the duration of the  Covid-19 declaration under Section 564(b)(1) of the Act, 21  U.S.C. section 360bbb-3(b)(1), unless the authorization is  terminated or revoked. Performed at [facility], [place] [address], [place], [place] [address]   Urine Culture     Status: Abnormal   Collection Time: 09/18/20 11:37 AM   Specimen: Urine, Random  Result Value Ref Range Status   Specimen Description URINE, RANDOM  Final   Special Requests NONE  Final   Culture (A)  Final    <10,000 COLONIES/mL INSIGNIFICANT GROWTH Performed  at [facility], [place] [address], [place], [place] [address]    Report Status 09/19/2020 FINAL  Final  Gastrointestinal Panel by PCR , Stool     Status: Abnormal   Collection Time: 09/19/20  4:19 PM   Specimen: Stool  Result Value Ref Range Status   Campylobacter species NOT DETECTED NOT DETECTED Final   Plesimonas shigelloides NOT DETECTED NOT DETECTED Final   Salmonella species NOT DETECTED NOT DETECTED Final   Yersinia enterocolitica DETECTED (A) NOT DETECTED Final    Comment: RESULT CALLED TO, READ BACK BY AND VERIFIED WITH: Arta Silence [email or web address]  09/20/20 MJU    Vibrio species NOT DETECTED NOT DETECTED Final   Vibrio cholerae NOT DETECTED NOT DETECTED Final   Enteroaggregative E coli (EAEC) NOT DETECTED NOT DETECTED Final   Enteropathogenic E coli  (EPEC) NOT DETECTED NOT DETECTED Final   Enterotoxigenic E coli (ETEC) NOT DETECTED NOT DETECTED Final   Shiga like toxin producing E coli (STEC) NOT DETECTED NOT DETECTED Final   Shigella/Enteroinvasive E coli (EIEC) NOT DETECTED NOT DETECTED Final   Cryptosporidium NOT DETECTED NOT DETECTED Final   Cyclospora cayetanensis NOT DETECTED NOT DETECTED Final   Entamoeba histolytica NOT DETECTED NOT DETECTED Final   Giardia lamblia NOT DETECTED NOT DETECTED Final   Adenovirus F40/41 NOT DETECTED NOT DETECTED Final   Astrovirus NOT DETECTED NOT DETECTED Final   Norovirus GI/GII NOT DETECTED NOT DETECTED Final   Rotavirus A NOT DETECTED NOT DETECTED Final   Sapovirus (I, II, IV, and V) NOT DETECTED NOT DETECTED Final    Comment: Performed at [facility], [place] [place], [place] [address]  Culture, blood (routine x 2)     Status: None (Preliminary result)   Collection Time: 09/22/20  1:52 PM   Specimen: BLOOD  Result Value Ref Range Status   Specimen Description BLOOD LEFT ANTECUBITAL  Final   Special Requests   Final    BOTTLES DRAWN AEROBIC AND ANAEROBIC Blood Culture results may not be optimal due to an inadequate volume of blood received in culture bottles   Culture   Final    NO GROWTH 4 DAYS Performed at [facility] [address] [place], [place] [address]    Report Status PENDING  Incomplete  Culture, blood (routine x 2)     Status: None (Preliminary result)   Collection Time: 09/22/20  1:57 PM   Specimen: BLOOD  Result Value Ref Range Status   Specimen Description BLOOD LEFT ANTECUBITAL  Final   Special Requests   Final    BOTTLES DRAWN AEROBIC AND ANAEROBIC Blood Culture adequate volume   Culture   Final    NO GROWTH 4 DAYS Performed at [facility], [place] [address] [place], [place] [address]    Report Status PENDING  Incomplete  C Difficile Quick Screen (NO PCR Reflex)     Status: Abnormal   Collection Time: 09/23/20 11:01 AM   Specimen:  STOOL  Result Value Ref Range Status   C Diff antigen POSITIVE (A) NEGATIVE Final   C Diff toxin NEGATIVE NEGATIVE Final   C Diff interpretation   Final    Results are indeterminate. Please contact your campus specific ID physician or [place].    Comment: Performed at [facility], [place] [address], [place],  [address]  Respiratory Panel by RT PCR (Flu A&B, Covid) - Nasopharyngeal Swab     Status: None   Collection Time: 09/26/20 11:30 AM  Specimen: Nasopharyngeal Swab  Result Value Ref Range Status   SARS Coronavirus 2 by RT PCR NEGATIVE NEGATIVE Final    Comment: (NOTE) SARS-CoV-2 target nucleic acids are NOT DETECTED.  The SARS-CoV-2 RNA is generally detectable in upper respiratoy specimens during the acute phase of infection. The lowest concentration of SARS-CoV-2 viral copies this assay can detect is 131 copies/mL. A negative result does not preclude SARS-Cov-2 infection and should not be used as the sole basis for treatment or other patient management decisions. A negative result may occur with  improper specimen collection/handling, submission of specimen other than nasopharyngeal swab, presence of viral mutation(s) within the areas targeted by this assay, and inadequate number of viral copies (<131 copies/mL). A negative result must be combined with clinical observations, patient history, and epidemiological information. The expected result is Negative.  Fact Sheet for Patients:  PinkCheek.be  Fact Sheet for Healthcare Providers:  GravelBags.it  This test is no t yet approved or cleared by the [place] FDA and  has been authorized for detection and/or diagnosis of SARS-CoV-2 by FDA under an Emergency Use Authorization (EUA). This EUA will remain  in effect (meaning this test can be used) for the duration of the COVID-19 declaration under Section 564(b)(1) of the Act, 21 U.S.C. section 360bbb-3(b)(1),  unless the authorization is terminated or revoked sooner.     Influenza A by PCR NEGATIVE NEGATIVE Final   Influenza B by PCR NEGATIVE NEGATIVE Final    Comment: (NOTE) The Xpert Xpress SARS-CoV-2/FLU/RSV assay is intended as an aid in  the diagnosis of influenza from Nasopharyngeal swab specimens and  should not be used as a sole basis for treatment. Nasal washings and  aspirates are unacceptable for Xpert Xpress SARS-CoV-2/FLU/RSV  testing.  Fact Sheet for Patients: PinkCheek.be  Fact Sheet for Healthcare Providers: GravelBags.it  This test is not yet approved or cleared by the [place] FDA and  has been authorized for detection and/or diagnosis of SARS-CoV-2 by  FDA under an Emergency Use Authorization (EUA). This EUA will remain  in effect (meaning this test can be used) for the duration of the  Covid-19 declaration under Section 564(b)(1) of the Act, 21  U.S.C. section 360bbb-3(b)(1), unless the authorization is  terminated or revoked. Performed at [facility], [place] [address] [place], [place] [address]      Labs: BNP (last 3 results) No results for input(s): BNP in the last 8760 hours. Basic Metabolic Panel: Recent Labs  Lab 09/21/20 0145 09/22/20 0407 09/23/20 0212 09/24/20 0126 09/25/20 0859  NA 137 138 139 139 140  K 4.3 3.7 3.8 3.8 3.7  CL 106 108 110 111 109  CO2 17* 20* 18* 20* 20*  GLUCOSE 128* 139* 87 95 132*  BUN $Re'16 15 9 9 8  'OeA$ CREATININE 1.11* 0.88 0.85 0.76 0.92  CALCIUM 8.1* 8.1* 8.1* 8.1* 8.6*  MG  --   --  1.1* 1.8  --    Liver Function Tests: Recent Labs  Lab 09/21/20 0145  AST 14*  ALT 15  ALKPHOS 38  BILITOT 1.1  PROT 5.0*  ALBUMIN 2.3*   No results for input(s): LIPASE, AMYLASE in the last 168 hours. No results for input(s): AMMONIA in the last 168 hours. CBC: Recent Labs  Lab 09/21/20 0145 09/22/20 0407 09/23/20 0212 09/24/20 0126 09/25/20 0859  WBC 7.9  8.3 7.9 7.2 7.3  HGB 9.2* 9.1* 9.5* 8.9* 10.2*  HCT 28.3* 27.7* 29.0* 27.7* 31.7*  MCV 92.5 91.7 92.4  92.3 92.4  PLT 162 178  187 193 194 213   Cardiac Enzymes: No results for input(s): CKTOTAL, CKMB, CKMBINDEX, TROPONINI in the last 168 hours. BNP: Invalid input(s): POCBNP CBG: Recent Labs  Lab 09/25/20 1134 09/25/20 1642 09/25/20 2106 09/26/20 0658 09/26/20 1131  GLUCAP 148* 77 124* 93 122*   D-Dimer No results for input(s): DDIMER in the last 72 hours. Hgb A1c No results for input(s): HGBA1C in the last 72 hours. Lipid Profile No results for input(s): CHOL, HDL, LDLCALC, TRIG, CHOLHDL, LDLDIRECT in the last 72 hours. Thyroid function studies No results for input(s): TSH, T4TOTAL, T3FREE, THYROIDAB in the last 72 hours.  Invalid input(s): FREET3 Anemia work up No results for input(s): VITAMINB12, FOLATE, FERRITIN, TIBC, IRON, RETICCTPCT in the last 72 hours. Urinalysis    Component Value Date/Time   COLORURINE YELLOW 09/18/2020 1137   APPEARANCEUR HAZY (A) 09/18/2020 1137   LABSPEC 1.012 09/18/2020 1137   PHURINE 5.0 09/18/2020 1137   GLUCOSEU NEGATIVE 09/18/2020 1137   HGBUR NEGATIVE 09/18/2020 1137   [place] 09/18/2020 1137   KETONESUR NEGATIVE 09/18/2020 1137   PROTEINUR NEGATIVE 09/18/2020 1137   NITRITE NEGATIVE 09/18/2020 1137   LEUKOCYTESUR NEGATIVE 09/18/2020 1137   Sepsis Labs Invalid input(s): PROCALCITONIN,  WBC,  LACTICIDVEN Microbiology Recent Results (from the past 240 hour(s))  Blood culture (routine x 2)     Status: None   Collection Time: 09/17/20 11:20 PM   Specimen: BLOOD  Result Value Ref Range Status   Specimen Description BLOOD RIGHT ANTECUBITAL  Final   Special Requests   Final    BOTTLES DRAWN AEROBIC AND ANAEROBIC Blood Culture results may not be optimal due to an inadequate volume of blood received in culture bottles   Culture   Final    NO GROWTH 5 DAYS Performed at [facility], [place] [address]  [place], [place] [address]    Report Status 09/22/2020 FINAL  Final  Blood culture (routine x 2)     Status: None   Collection Time: 09/17/20 11:20 PM   Specimen: BLOOD RIGHT HAND  Result Value Ref Range Status   Specimen Description BLOOD RIGHT HAND  Final   Special Requests   Final    BOTTLES DRAWN AEROBIC AND ANAEROBIC Blood Culture adequate volume   Culture   Final    NO GROWTH 5 DAYS Performed at [facility], [place] [address], [place], [place] [address]    Report Status 09/22/2020 FINAL  Final  Respiratory Panel by RT PCR (Flu A&B, Covid) - Nasopharyngeal Swab     Status: None   Collection Time: 09/17/20 11:31 PM   Specimen: Nasopharyngeal Swab  Result Value Ref Range Status   SARS Coronavirus 2 by RT PCR NEGATIVE NEGATIVE Final    Comment: (NOTE) SARS-CoV-2 target nucleic acids are NOT DETECTED.  The SARS-CoV-2 RNA is generally detectable in upper respiratoy specimens during the acute phase of infection. The lowest concentration of SARS-CoV-2 viral copies this assay can detect is 131 copies/mL. A negative result does not preclude SARS-Cov-2 infection and should not be used as the sole basis for treatment or other patient management decisions. A negative result may occur with  improper specimen collection/handling, submission of specimen other than nasopharyngeal swab, presence of viral mutation(s) within the areas targeted by this assay, and inadequate number of viral copies (<131 copies/mL). A negative result must be combined with clinical observations, patient history, and epidemiological information. The expected result is Negative.  Fact Sheet  for Patients:  PinkCheek.be  Fact Sheet for Healthcare Providers:  GravelBags.it  This test is no t yet approved or cleared by the [place] FDA and  has been authorized for detection and/or diagnosis of SARS-CoV-2 by FDA under an Emergency Use Authorization (EUA).  This EUA will remain  in effect (meaning this test can be used) for the duration of the COVID-19 declaration under Section 564(b)(1) of the Act, 21 U.S.C. section 360bbb-3(b)(1), unless the authorization is terminated or revoked sooner.     Influenza A by PCR NEGATIVE NEGATIVE Final   Influenza B by PCR NEGATIVE NEGATIVE Final    Comment: (NOTE) The Xpert Xpress SARS-CoV-2/FLU/RSV assay is intended as an aid in  the diagnosis of influenza from Nasopharyngeal swab specimens and  should not be used as a sole basis for treatment. Nasal washings and  aspirates are unacceptable for Xpert Xpress SARS-CoV-2/FLU/RSV  testing.  Fact Sheet for Patients: PinkCheek.be  Fact Sheet for Healthcare Providers: GravelBags.it  This test is not yet approved or cleared by the [place] FDA and  has been authorized for detection and/or diagnosis of SARS-CoV-2 by  FDA under an Emergency Use Authorization (EUA). This EUA will remain  in effect (meaning this test can be used) for the duration of the  Covid-19 declaration under Section 564(b)(1) of the Act, 21  U.S.C. section 360bbb-3(b)(1), unless the authorization is  terminated or revoked. Performed at [facility], [place] [address], [place], [place] [address]   Urine Culture     Status: Abnormal   Collection Time: 09/18/20 11:37 AM   Specimen: Urine, Random  Result Value Ref Range Status   Specimen Description URINE, RANDOM  Final   Special Requests NONE  Final   Culture (A)  Final    <10,000 COLONIES/mL INSIGNIFICANT GROWTH Performed at [facility], [place] [address] [place], [place] [address]    Report Status 09/19/2020 FINAL  Final  Gastrointestinal Panel by PCR , Stool     Status: Abnormal   Collection Time: 09/19/20  4:19 PM   Specimen: Stool  Result Value Ref Range Status   Campylobacter species NOT DETECTED NOT DETECTED Final   Plesimonas shigelloides NOT DETECTED  NOT DETECTED Final   Salmonella species NOT DETECTED NOT DETECTED Final   Yersinia enterocolitica DETECTED (A) NOT DETECTED Final    Comment: RESULT CALLED TO, READ BACK BY AND VERIFIED WITH: Arta Silence [email or web address]  09/20/20 MJU    Vibrio species NOT DETECTED NOT DETECTED Final   Vibrio cholerae NOT DETECTED NOT DETECTED Final   Enteroaggregative E coli (EAEC) NOT DETECTED NOT DETECTED Final   Enteropathogenic E coli (EPEC) NOT DETECTED NOT DETECTED Final   Enterotoxigenic E coli (ETEC) NOT DETECTED NOT DETECTED Final   Shiga like toxin producing E coli (STEC) NOT DETECTED NOT DETECTED Final   Shigella/Enteroinvasive E coli (EIEC) NOT DETECTED NOT DETECTED Final   Cryptosporidium NOT DETECTED NOT DETECTED Final   Cyclospora cayetanensis NOT DETECTED NOT DETECTED Final   Entamoeba histolytica NOT DETECTED NOT DETECTED Final   Giardia lamblia NOT DETECTED NOT DETECTED Final   Adenovirus F40/41 NOT DETECTED NOT DETECTED Final   Astrovirus NOT DETECTED NOT DETECTED Final   Norovirus GI/GII NOT DETECTED NOT DETECTED Final   Rotavirus A NOT DETECTED NOT DETECTED Final   Sapovirus (I, II, IV, and V) NOT DETECTED NOT DETECTED Final    Comment: Performed at [facility], [place] [place], [place] [address]  Culture, blood (routine x  2)     Status: None (Preliminary result)   Collection Time: 09/22/20  1:52 PM   Specimen: BLOOD  Result Value Ref Range Status   Specimen Description BLOOD LEFT ANTECUBITAL  Final   Special Requests   Final    BOTTLES DRAWN AEROBIC AND ANAEROBIC Blood Culture results may not be optimal due to an inadequate volume of blood received in culture bottles   Culture   Final    NO GROWTH 4 DAYS Performed at [facility], [place] [address] [place], [place] [address]    Report Status PENDING  Incomplete  Culture, blood (routine x 2)     Status: None (Preliminary result)   Collection Time: 09/22/20  1:57 PM   Specimen: BLOOD  Result Value Ref Range  Status   Specimen Description BLOOD LEFT ANTECUBITAL  Final   Special Requests   Final    BOTTLES DRAWN AEROBIC AND ANAEROBIC Blood Culture adequate volume   Culture   Final    NO GROWTH 4 DAYS Performed at [facility], [place] [address] [place], [place] [address]    Report Status PENDING  Incomplete  C Difficile Quick Screen (NO PCR Reflex)     Status: Abnormal   Collection Time: 09/23/20 11:01 AM   Specimen: STOOL  Result Value Ref Range Status   C Diff antigen POSITIVE (A) NEGATIVE Final   C Diff toxin NEGATIVE NEGATIVE Final   C Diff interpretation   Final    Results are indeterminate. Please contact your campus specific ID physician or [place].    Comment: Performed at [facility], [place] [address] [place], [place] [address]  Respiratory Panel by RT PCR (Flu A&B, Covid) - Nasopharyngeal Swab     Status: None   Collection Time: 09/26/20 11:30 AM   Specimen: Nasopharyngeal Swab  Result Value Ref Range Status   SARS Coronavirus 2 by RT PCR NEGATIVE NEGATIVE Final    Comment: (NOTE) SARS-CoV-2 target nucleic acids are NOT DETECTED.  The SARS-CoV-2 RNA is generally detectable in upper respiratoy specimens during the acute phase of infection. The lowest concentration of SARS-CoV-2 viral copies this assay can detect is 131 copies/mL. A negative result does not preclude SARS-Cov-2 infection and should not be used as the sole basis for treatment or other patient management decisions. A negative result may occur with  improper specimen collection/handling, submission of specimen other than nasopharyngeal swab, presence of viral mutation(s) within the areas targeted by this assay, and inadequate number of viral copies (<131 copies/mL). A negative result must be combined with clinical observations, patient history, and epidemiological information. The expected result is Negative.  Fact Sheet for Patients:  PinkCheek.be  Fact Sheet for Healthcare  Providers:  GravelBags.it  This test is no t yet approved or cleared by the [place] FDA and  has been authorized for detection and/or diagnosis of SARS-CoV-2 by FDA under an Emergency Use Authorization (EUA). This EUA will remain  in effect (meaning this test can be used) for the duration of the COVID-19 declaration under Section 564(b)(1) of the Act, 21 U.S.C. section 360bbb-3(b)(1), unless the authorization is terminated or revoked sooner.     Influenza A by PCR NEGATIVE NEGATIVE Final   Influenza B by PCR NEGATIVE NEGATIVE Final    Comment: (NOTE) The Xpert Xpress SARS-CoV-2/FLU/RSV assay is intended as an aid in  the diagnosis of influenza from Nasopharyngeal swab specimens and  should not be used as a sole basis for treatment.  Nasal washings and  aspirates are unacceptable for Xpert Xpress SARS-CoV-2/FLU/RSV  testing.  Fact Sheet for Patients: PinkCheek.be  Fact Sheet for Healthcare Providers: GravelBags.it  This test is not yet approved or cleared by the [place] FDA and  has been authorized for detection and/or diagnosis of SARS-CoV-2 by  FDA under an Emergency Use Authorization (EUA). This EUA will remain  in effect (meaning this test can be used) for the duration of the  Covid-19 declaration under Section 564(b)(1) of the Act, 21  U.S.C. section 360bbb-3(b)(1), unless the authorization is  terminated or revoked. Performed at [facility], [place] [address], [place], [place] [address]      Time coordinating discharge: 40 minutes  SIGNED:   Elmarie Shiley, MD  [facility]

## 2020-09-27 LAB — CULTURE, BLOOD (ROUTINE X 2)
Culture: NO GROWTH
Culture: NO GROWTH
Special Requests: ADEQUATE

## 2020-09-28 ENCOUNTER — Other Ambulatory Visit: Payer: Self-pay | Admitting: *Deleted

## 2020-09-28 NOTE — Patient Outreach (Signed)
Member screened for potential [facility] Care Management needs as a benefit of [place] [employer].  Per Patient [place] Stone member resides in [place] [facility].   Communication sent to SNF SW to collaborate about anticipated dc plans and potential [facility] Care Management needs.   Marthenia Rolling, MSN-Ed, RN,BSN [place] Acute Care Coordinator [phone] [facility]) [phone]  (Toll free office)

## 2020-09-30 DIAGNOSIS — I629 Nontraumatic intracranial hemorrhage, unspecified: Secondary | ICD-10-CM | POA: Diagnosis not present

## 2020-09-30 DIAGNOSIS — N1831 Chronic kidney disease, stage 3a: Secondary | ICD-10-CM | POA: Diagnosis not present

## 2020-09-30 DIAGNOSIS — R262 Difficulty in walking, not elsewhere classified: Secondary | ICD-10-CM | POA: Diagnosis not present

## 2020-09-30 DIAGNOSIS — E1151 Type 2 diabetes mellitus with diabetic peripheral angiopathy without gangrene: Secondary | ICD-10-CM | POA: Diagnosis not present

## 2020-10-11 DIAGNOSIS — G319 Degenerative disease of nervous system, unspecified: Secondary | ICD-10-CM | POA: Diagnosis not present

## 2020-10-11 DIAGNOSIS — N390 Urinary tract infection, site not specified: Secondary | ICD-10-CM | POA: Diagnosis not present

## 2020-10-11 DIAGNOSIS — M47812 Spondylosis without myelopathy or radiculopathy, cervical region: Secondary | ICD-10-CM | POA: Diagnosis not present

## 2020-10-11 DIAGNOSIS — S0003XA Contusion of scalp, initial encounter: Secondary | ICD-10-CM | POA: Diagnosis not present

## 2020-10-11 DIAGNOSIS — M2578 Osteophyte, vertebrae: Secondary | ICD-10-CM | POA: Diagnosis not present

## 2020-10-11 DIAGNOSIS — M19011 Primary osteoarthritis, right shoulder: Secondary | ICD-10-CM | POA: Diagnosis not present

## 2020-10-11 DIAGNOSIS — S199XXA Unspecified injury of neck, initial encounter: Secondary | ICD-10-CM | POA: Diagnosis not present

## 2020-10-11 DIAGNOSIS — S0990XA Unspecified injury of head, initial encounter: Secondary | ICD-10-CM | POA: Diagnosis not present

## 2020-10-11 DIAGNOSIS — I6782 Cerebral ischemia: Secondary | ICD-10-CM | POA: Diagnosis not present

## 2020-10-11 DIAGNOSIS — S098XXA Other specified injuries of head, initial encounter: Secondary | ICD-10-CM | POA: Diagnosis not present

## 2020-10-11 DIAGNOSIS — B9689 Other specified bacterial agents as the cause of diseases classified elsewhere: Secondary | ICD-10-CM | POA: Diagnosis not present

## 2020-10-15 DIAGNOSIS — K219 Gastro-esophageal reflux disease without esophagitis: Secondary | ICD-10-CM | POA: Diagnosis not present

## 2020-10-15 DIAGNOSIS — R2689 Other abnormalities of gait and mobility: Secondary | ICD-10-CM | POA: Diagnosis not present

## 2020-10-15 DIAGNOSIS — A046 Enteritis due to Yersinia enterocolitica: Secondary | ICD-10-CM | POA: Diagnosis not present

## 2020-10-15 DIAGNOSIS — Z792 Long term (current) use of antibiotics: Secondary | ICD-10-CM | POA: Diagnosis not present

## 2020-10-15 DIAGNOSIS — N390 Urinary tract infection, site not specified: Secondary | ICD-10-CM | POA: Diagnosis not present

## 2020-10-15 DIAGNOSIS — R278 Other lack of coordination: Secondary | ICD-10-CM | POA: Diagnosis not present

## 2020-10-15 DIAGNOSIS — L89322 Pressure ulcer of left buttock, stage 2: Secondary | ICD-10-CM | POA: Diagnosis not present

## 2020-10-15 DIAGNOSIS — E785 Hyperlipidemia, unspecified: Secondary | ICD-10-CM | POA: Diagnosis not present

## 2020-10-15 DIAGNOSIS — I129 Hypertensive chronic kidney disease with stage 1 through stage 4 chronic kidney disease, or unspecified chronic kidney disease: Secondary | ICD-10-CM | POA: Diagnosis not present

## 2020-10-15 DIAGNOSIS — Z86711 Personal history of pulmonary embolism: Secondary | ICD-10-CM | POA: Diagnosis not present

## 2020-10-15 DIAGNOSIS — R531 Weakness: Secondary | ICD-10-CM | POA: Diagnosis not present

## 2020-10-15 DIAGNOSIS — G629 Polyneuropathy, unspecified: Secondary | ICD-10-CM | POA: Diagnosis not present

## 2020-10-15 DIAGNOSIS — F329 Major depressive disorder, single episode, unspecified: Secondary | ICD-10-CM | POA: Diagnosis not present

## 2020-10-15 DIAGNOSIS — E1122 Type 2 diabetes mellitus with diabetic chronic kidney disease: Secondary | ICD-10-CM | POA: Diagnosis not present

## 2020-10-15 DIAGNOSIS — Z79899 Other long term (current) drug therapy: Secondary | ICD-10-CM | POA: Diagnosis not present

## 2020-10-15 DIAGNOSIS — Z86718 Personal history of other venous thrombosis and embolism: Secondary | ICD-10-CM | POA: Diagnosis not present

## 2020-10-15 DIAGNOSIS — Z7902 Long term (current) use of antithrombotics/antiplatelets: Secondary | ICD-10-CM | POA: Diagnosis not present

## 2020-10-15 DIAGNOSIS — L89892 Pressure ulcer of other site, stage 2: Secondary | ICD-10-CM | POA: Diagnosis not present

## 2020-10-15 DIAGNOSIS — C7490 Malignant neoplasm of unspecified part of unspecified adrenal gland: Secondary | ICD-10-CM | POA: Diagnosis not present

## 2020-10-15 DIAGNOSIS — Z79891 Long term (current) use of opiate analgesic: Secondary | ICD-10-CM | POA: Diagnosis not present

## 2020-10-15 DIAGNOSIS — M6281 Muscle weakness (generalized): Secondary | ICD-10-CM | POA: Diagnosis not present

## 2020-10-15 DIAGNOSIS — N183 Chronic kidney disease, stage 3 unspecified: Secondary | ICD-10-CM | POA: Diagnosis not present

## 2020-10-15 DIAGNOSIS — M7130 Other bursal cyst, unspecified site: Secondary | ICD-10-CM | POA: Diagnosis not present

## 2020-10-15 DIAGNOSIS — E1140 Type 2 diabetes mellitus with diabetic neuropathy, unspecified: Secondary | ICD-10-CM | POA: Diagnosis not present

## 2020-10-16 DIAGNOSIS — N183 Chronic kidney disease, stage 3 unspecified: Secondary | ICD-10-CM | POA: Diagnosis not present

## 2020-10-16 DIAGNOSIS — L89322 Pressure ulcer of left buttock, stage 2: Secondary | ICD-10-CM | POA: Diagnosis not present

## 2020-10-16 DIAGNOSIS — G629 Polyneuropathy, unspecified: Secondary | ICD-10-CM | POA: Diagnosis not present

## 2020-10-16 DIAGNOSIS — E1122 Type 2 diabetes mellitus with diabetic chronic kidney disease: Secondary | ICD-10-CM | POA: Diagnosis not present

## 2020-10-16 DIAGNOSIS — L89892 Pressure ulcer of other site, stage 2: Secondary | ICD-10-CM | POA: Diagnosis not present

## 2020-10-16 DIAGNOSIS — I129 Hypertensive chronic kidney disease with stage 1 through stage 4 chronic kidney disease, or unspecified chronic kidney disease: Secondary | ICD-10-CM | POA: Diagnosis not present

## 2020-10-18 DIAGNOSIS — N183 Chronic kidney disease, stage 3 unspecified: Secondary | ICD-10-CM | POA: Diagnosis not present

## 2020-10-18 DIAGNOSIS — E1122 Type 2 diabetes mellitus with diabetic chronic kidney disease: Secondary | ICD-10-CM | POA: Diagnosis not present

## 2020-10-18 DIAGNOSIS — I129 Hypertensive chronic kidney disease with stage 1 through stage 4 chronic kidney disease, or unspecified chronic kidney disease: Secondary | ICD-10-CM | POA: Diagnosis not present

## 2020-10-19 ENCOUNTER — Ambulatory Visit: Payer: Medicare Other | Admitting: Podiatry

## 2020-10-19 DIAGNOSIS — E1122 Type 2 diabetes mellitus with diabetic chronic kidney disease: Secondary | ICD-10-CM | POA: Diagnosis not present

## 2020-10-19 DIAGNOSIS — N183 Chronic kidney disease, stage 3 unspecified: Secondary | ICD-10-CM | POA: Diagnosis not present

## 2020-10-19 DIAGNOSIS — G629 Polyneuropathy, unspecified: Secondary | ICD-10-CM | POA: Diagnosis not present

## 2020-10-19 DIAGNOSIS — L89892 Pressure ulcer of other site, stage 2: Secondary | ICD-10-CM | POA: Diagnosis not present

## 2020-10-19 DIAGNOSIS — L89322 Pressure ulcer of left buttock, stage 2: Secondary | ICD-10-CM | POA: Diagnosis not present

## 2020-10-19 DIAGNOSIS — I129 Hypertensive chronic kidney disease with stage 1 through stage 4 chronic kidney disease, or unspecified chronic kidney disease: Secondary | ICD-10-CM | POA: Diagnosis not present

## 2020-10-20 DIAGNOSIS — I129 Hypertensive chronic kidney disease with stage 1 through stage 4 chronic kidney disease, or unspecified chronic kidney disease: Secondary | ICD-10-CM | POA: Diagnosis not present

## 2020-10-20 DIAGNOSIS — N183 Chronic kidney disease, stage 3 unspecified: Secondary | ICD-10-CM | POA: Diagnosis not present

## 2020-10-20 DIAGNOSIS — L89892 Pressure ulcer of other site, stage 2: Secondary | ICD-10-CM | POA: Diagnosis not present

## 2020-10-20 DIAGNOSIS — L89322 Pressure ulcer of left buttock, stage 2: Secondary | ICD-10-CM | POA: Diagnosis not present

## 2020-10-20 DIAGNOSIS — G629 Polyneuropathy, unspecified: Secondary | ICD-10-CM | POA: Diagnosis not present

## 2020-10-20 DIAGNOSIS — E1122 Type 2 diabetes mellitus with diabetic chronic kidney disease: Secondary | ICD-10-CM | POA: Diagnosis not present

## 2020-10-23 DIAGNOSIS — I82409 Acute embolism and thrombosis of unspecified deep veins of unspecified lower extremity: Secondary | ICD-10-CM | POA: Diagnosis not present

## 2020-10-23 DIAGNOSIS — N1832 Chronic kidney disease, stage 3b: Secondary | ICD-10-CM | POA: Diagnosis not present

## 2020-10-23 DIAGNOSIS — E11622 Type 2 diabetes mellitus with other skin ulcer: Secondary | ICD-10-CM | POA: Diagnosis not present

## 2020-10-23 DIAGNOSIS — L97929 Non-pressure chronic ulcer of unspecified part of left lower leg with unspecified severity: Secondary | ICD-10-CM | POA: Diagnosis not present

## 2020-10-23 DIAGNOSIS — Z7689 Persons encountering health services in other specified circumstances: Secondary | ICD-10-CM | POA: Diagnosis not present

## 2020-10-23 DIAGNOSIS — J449 Chronic obstructive pulmonary disease, unspecified: Secondary | ICD-10-CM | POA: Diagnosis not present

## 2020-10-23 DIAGNOSIS — J9610 Chronic respiratory failure, unspecified whether with hypoxia or hypercapnia: Secondary | ICD-10-CM | POA: Diagnosis not present

## 2020-10-24 DIAGNOSIS — L89322 Pressure ulcer of left buttock, stage 2: Secondary | ICD-10-CM | POA: Diagnosis not present

## 2020-10-24 DIAGNOSIS — G629 Polyneuropathy, unspecified: Secondary | ICD-10-CM | POA: Diagnosis not present

## 2020-10-24 DIAGNOSIS — E1122 Type 2 diabetes mellitus with diabetic chronic kidney disease: Secondary | ICD-10-CM | POA: Diagnosis not present

## 2020-10-24 DIAGNOSIS — I129 Hypertensive chronic kidney disease with stage 1 through stage 4 chronic kidney disease, or unspecified chronic kidney disease: Secondary | ICD-10-CM | POA: Diagnosis not present

## 2020-10-24 DIAGNOSIS — L89892 Pressure ulcer of other site, stage 2: Secondary | ICD-10-CM | POA: Diagnosis not present

## 2020-10-24 DIAGNOSIS — N183 Chronic kidney disease, stage 3 unspecified: Secondary | ICD-10-CM | POA: Diagnosis not present

## 2020-10-26 DIAGNOSIS — E1122 Type 2 diabetes mellitus with diabetic chronic kidney disease: Secondary | ICD-10-CM | POA: Diagnosis not present

## 2020-10-26 DIAGNOSIS — L89892 Pressure ulcer of other site, stage 2: Secondary | ICD-10-CM | POA: Diagnosis not present

## 2020-10-26 DIAGNOSIS — L89322 Pressure ulcer of left buttock, stage 2: Secondary | ICD-10-CM | POA: Diagnosis not present

## 2020-10-26 DIAGNOSIS — I129 Hypertensive chronic kidney disease with stage 1 through stage 4 chronic kidney disease, or unspecified chronic kidney disease: Secondary | ICD-10-CM | POA: Diagnosis not present

## 2020-10-26 DIAGNOSIS — G629 Polyneuropathy, unspecified: Secondary | ICD-10-CM | POA: Diagnosis not present

## 2020-10-26 DIAGNOSIS — N183 Chronic kidney disease, stage 3 unspecified: Secondary | ICD-10-CM | POA: Diagnosis not present

## 2020-10-27 DIAGNOSIS — N183 Chronic kidney disease, stage 3 unspecified: Secondary | ICD-10-CM | POA: Diagnosis not present

## 2020-10-27 DIAGNOSIS — L89322 Pressure ulcer of left buttock, stage 2: Secondary | ICD-10-CM | POA: Diagnosis not present

## 2020-10-27 DIAGNOSIS — L89892 Pressure ulcer of other site, stage 2: Secondary | ICD-10-CM | POA: Diagnosis not present

## 2020-10-27 DIAGNOSIS — I129 Hypertensive chronic kidney disease with stage 1 through stage 4 chronic kidney disease, or unspecified chronic kidney disease: Secondary | ICD-10-CM | POA: Diagnosis not present

## 2020-10-27 DIAGNOSIS — G629 Polyneuropathy, unspecified: Secondary | ICD-10-CM | POA: Diagnosis not present

## 2020-10-27 DIAGNOSIS — E1122 Type 2 diabetes mellitus with diabetic chronic kidney disease: Secondary | ICD-10-CM | POA: Diagnosis not present

## 2020-10-30 ENCOUNTER — Other Ambulatory Visit: Payer: Self-pay | Admitting: *Deleted

## 2020-10-30 NOTE — Patient Outreach (Signed)
Referral from Rhys Martini, RN to [place] for medication Assistance.

## 2020-10-30 NOTE — Patient Outreach (Signed)
[facility]) Care Management  10/30/2020  [facility] [facility] [place] 17-Jul-1941 [phone]   Telephone Screen Referral for medication assistance Initial Outreach 10/30/2020  RN spoke with pt and introduced [facility] services and the purpose for today's call. Discuss her current need for medication assistance and offered [facility] pharmacy to assist (receptive).  RN screened for any other needs related to her ongoing medical history. RN offered case management services for possible needs however pt declined complex with no immediate needs.   Will make a referral or [place] and a health coach. Will also notify pt's provider of these referrals. No additional needs at this time.   Raina Mina, RN Care Management Coordinator [place] Office [phone]

## 2020-10-31 DIAGNOSIS — N183 Chronic kidney disease, stage 3 unspecified: Secondary | ICD-10-CM | POA: Diagnosis not present

## 2020-10-31 DIAGNOSIS — E1122 Type 2 diabetes mellitus with diabetic chronic kidney disease: Secondary | ICD-10-CM | POA: Diagnosis not present

## 2020-10-31 DIAGNOSIS — L89322 Pressure ulcer of left buttock, stage 2: Secondary | ICD-10-CM | POA: Diagnosis not present

## 2020-10-31 DIAGNOSIS — G629 Polyneuropathy, unspecified: Secondary | ICD-10-CM | POA: Diagnosis not present

## 2020-10-31 DIAGNOSIS — L89892 Pressure ulcer of other site, stage 2: Secondary | ICD-10-CM | POA: Diagnosis not present

## 2020-10-31 DIAGNOSIS — I129 Hypertensive chronic kidney disease with stage 1 through stage 4 chronic kidney disease, or unspecified chronic kidney disease: Secondary | ICD-10-CM | POA: Diagnosis not present

## 2020-11-01 DIAGNOSIS — Z6828 Body mass index (BMI) 28.0-28.9, adult: Secondary | ICD-10-CM | POA: Diagnosis not present

## 2020-11-01 DIAGNOSIS — R110 Nausea: Secondary | ICD-10-CM | POA: Diagnosis not present

## 2020-11-01 DIAGNOSIS — I700 Atherosclerosis of aorta: Secondary | ICD-10-CM | POA: Diagnosis not present

## 2020-11-02 ENCOUNTER — Ambulatory Visit (INDEPENDENT_AMBULATORY_CARE_PROVIDER_SITE_OTHER): Payer: Medicare Other | Admitting: Podiatry

## 2020-11-02 ENCOUNTER — Other Ambulatory Visit: Payer: Self-pay

## 2020-11-02 ENCOUNTER — Encounter: Payer: Self-pay | Admitting: Podiatry

## 2020-11-02 DIAGNOSIS — L89892 Pressure ulcer of other site, stage 2: Secondary | ICD-10-CM | POA: Diagnosis not present

## 2020-11-02 DIAGNOSIS — B351 Tinea unguium: Secondary | ICD-10-CM

## 2020-11-02 DIAGNOSIS — G629 Polyneuropathy, unspecified: Secondary | ICD-10-CM | POA: Diagnosis not present

## 2020-11-02 DIAGNOSIS — E1169 Type 2 diabetes mellitus with other specified complication: Secondary | ICD-10-CM | POA: Diagnosis not present

## 2020-11-02 DIAGNOSIS — E1151 Type 2 diabetes mellitus with diabetic peripheral angiopathy without gangrene: Secondary | ICD-10-CM | POA: Diagnosis not present

## 2020-11-02 DIAGNOSIS — E1122 Type 2 diabetes mellitus with diabetic chronic kidney disease: Secondary | ICD-10-CM | POA: Diagnosis not present

## 2020-11-02 DIAGNOSIS — L89322 Pressure ulcer of left buttock, stage 2: Secondary | ICD-10-CM | POA: Diagnosis not present

## 2020-11-02 DIAGNOSIS — I129 Hypertensive chronic kidney disease with stage 1 through stage 4 chronic kidney disease, or unspecified chronic kidney disease: Secondary | ICD-10-CM | POA: Diagnosis not present

## 2020-11-02 DIAGNOSIS — N183 Chronic kidney disease, stage 3 unspecified: Secondary | ICD-10-CM | POA: Diagnosis not present

## 2020-11-02 NOTE — Progress Notes (Deleted)
[facility]  [address] [place],  [place]  [address] [phone]  Clinic Day:  11/02/2020  Referring physician: Marco Collie, MD   HISTORY OF PRESENT ILLNESS:  The patient is a 79 y.o. female with stage IA (T1c N0 M0) hormone positive breast cancer, status post a lumpectomy in September 2011.  She went on to receive adjuvant breast radiation.  The patient took anastrozole for her adjuvant endocrine management for 7 years before stopping it in 2018 after tests showed her with a low likelihood of benefitting from extended endocrine therapy in preventing future breast cancer.  She comes in today for routine follow up.  Since her last visit, the patient has been doing very well.  She denies having any particular changes in her breasts which concern her for recurrent disease.  Of note, her annual mammogram has yet to be done.   PHYSICAL EXAM:  There were no vitals taken for this visit. Wt Readings from Last 3 Encounters:  09/25/20 163 lb 12.8 oz (74.3 kg)  08/31/20 155 lb (70.3 kg)  01/22/18 165 lb (74.8 kg)   There is no height or weight on file to calculate BMI. Performance status (ECOG): {CHL ONC [id number] Physical Exam Constitutional:      Appearance: Normal appearance.  HENT:     Mouth/Throat:     Pharynx: Oropharynx is clear. No oropharyngeal exudate.  Cardiovascular:     Rate and Rhythm: Normal rate and regular rhythm.     Heart sounds: No murmur heard.  No friction rub. No gallop.   Pulmonary:     Breath sounds: Normal breath sounds.  Chest:     Breasts:        Right: No swelling, bleeding, inverted nipple, mass, nipple discharge or skin change.        Left: No swelling, bleeding, inverted nipple, mass, nipple discharge or skin change.  Abdominal:     General: Bowel sounds are normal. There is no distension.     Palpations: Abdomen is soft. There is no mass.     Tenderness: There is no abdominal tenderness.  Musculoskeletal:         General: No tenderness.     Cervical back: Normal range of motion and neck supple.     Right lower leg: No edema.     Left lower leg: No edema.  Lymphadenopathy:     Cervical: No cervical adenopathy.     Right cervical: No superficial, deep or posterior cervical adenopathy.    Left cervical: No superficial, deep or posterior cervical adenopathy.     Upper Body:     Right upper body: No supraclavicular or axillary adenopathy.     Left upper body: No supraclavicular or axillary adenopathy.     Lower Body: No right inguinal adenopathy. No left inguinal adenopathy.  Skin:    Coloration: Skin is not jaundiced.     Findings: No lesion or rash.  Neurological:     General: No focal deficit present.     Mental Status: She is alert and oriented to person, place, and time. Mental status is at baseline.  Psychiatric:        Mood and Affect: Mood normal.        Behavior: Behavior normal.        Thought Content: Thought content normal.        Judgment: Judgment normal.     ASSESSMENT & PLAN:   Assessment/Plan:  A 79 y.o. female with stage  IA hormone positive breast cancer, who is over 10 years out from her lumpectomy.  Based upon her clinical breast exam today, the patient remains disease-free.  Clinically, the patient is doing very well from a breast cancer perspective.  I will see her back in 1 year for a repeat clinical breast exam.  I will also ensure her annual mammogram is done before her next visit for her continued radiographic breast cancer surveillance. The patient understands all the plans discussed today and is in agreement with them.      Nieves Chapa Macarthur Critchley, MD

## 2020-11-03 ENCOUNTER — Inpatient Hospital Stay: Payer: Medicare Other | Attending: Oncology | Admitting: Oncology

## 2020-11-03 DIAGNOSIS — L89892 Pressure ulcer of other site, stage 2: Secondary | ICD-10-CM | POA: Diagnosis not present

## 2020-11-03 DIAGNOSIS — L89322 Pressure ulcer of left buttock, stage 2: Secondary | ICD-10-CM | POA: Diagnosis not present

## 2020-11-03 DIAGNOSIS — N183 Chronic kidney disease, stage 3 unspecified: Secondary | ICD-10-CM | POA: Diagnosis not present

## 2020-11-03 DIAGNOSIS — I129 Hypertensive chronic kidney disease with stage 1 through stage 4 chronic kidney disease, or unspecified chronic kidney disease: Secondary | ICD-10-CM | POA: Diagnosis not present

## 2020-11-03 DIAGNOSIS — E1122 Type 2 diabetes mellitus with diabetic chronic kidney disease: Secondary | ICD-10-CM | POA: Diagnosis not present

## 2020-11-03 DIAGNOSIS — G629 Polyneuropathy, unspecified: Secondary | ICD-10-CM | POA: Diagnosis not present

## 2020-11-07 DIAGNOSIS — E1122 Type 2 diabetes mellitus with diabetic chronic kidney disease: Secondary | ICD-10-CM | POA: Diagnosis not present

## 2020-11-07 DIAGNOSIS — G629 Polyneuropathy, unspecified: Secondary | ICD-10-CM | POA: Diagnosis not present

## 2020-11-07 DIAGNOSIS — L89892 Pressure ulcer of other site, stage 2: Secondary | ICD-10-CM | POA: Diagnosis not present

## 2020-11-07 DIAGNOSIS — N183 Chronic kidney disease, stage 3 unspecified: Secondary | ICD-10-CM | POA: Diagnosis not present

## 2020-11-07 DIAGNOSIS — I129 Hypertensive chronic kidney disease with stage 1 through stage 4 chronic kidney disease, or unspecified chronic kidney disease: Secondary | ICD-10-CM | POA: Diagnosis not present

## 2020-11-07 DIAGNOSIS — L89322 Pressure ulcer of left buttock, stage 2: Secondary | ICD-10-CM | POA: Diagnosis not present

## 2020-11-08 DIAGNOSIS — L89892 Pressure ulcer of other site, stage 2: Secondary | ICD-10-CM | POA: Diagnosis not present

## 2020-11-08 DIAGNOSIS — N183 Chronic kidney disease, stage 3 unspecified: Secondary | ICD-10-CM | POA: Diagnosis not present

## 2020-11-08 DIAGNOSIS — I129 Hypertensive chronic kidney disease with stage 1 through stage 4 chronic kidney disease, or unspecified chronic kidney disease: Secondary | ICD-10-CM | POA: Diagnosis not present

## 2020-11-08 DIAGNOSIS — L89322 Pressure ulcer of left buttock, stage 2: Secondary | ICD-10-CM | POA: Diagnosis not present

## 2020-11-08 DIAGNOSIS — G629 Polyneuropathy, unspecified: Secondary | ICD-10-CM | POA: Diagnosis not present

## 2020-11-08 DIAGNOSIS — E1122 Type 2 diabetes mellitus with diabetic chronic kidney disease: Secondary | ICD-10-CM | POA: Diagnosis not present

## 2020-11-13 DIAGNOSIS — L89892 Pressure ulcer of other site, stage 2: Secondary | ICD-10-CM | POA: Diagnosis not present

## 2020-11-13 DIAGNOSIS — E1122 Type 2 diabetes mellitus with diabetic chronic kidney disease: Secondary | ICD-10-CM | POA: Diagnosis not present

## 2020-11-13 DIAGNOSIS — N183 Chronic kidney disease, stage 3 unspecified: Secondary | ICD-10-CM | POA: Diagnosis not present

## 2020-11-13 DIAGNOSIS — L89322 Pressure ulcer of left buttock, stage 2: Secondary | ICD-10-CM | POA: Diagnosis not present

## 2020-11-13 DIAGNOSIS — G629 Polyneuropathy, unspecified: Secondary | ICD-10-CM | POA: Diagnosis not present

## 2020-11-13 DIAGNOSIS — I129 Hypertensive chronic kidney disease with stage 1 through stage 4 chronic kidney disease, or unspecified chronic kidney disease: Secondary | ICD-10-CM | POA: Diagnosis not present

## 2020-11-14 DIAGNOSIS — Z86711 Personal history of pulmonary embolism: Secondary | ICD-10-CM | POA: Diagnosis not present

## 2020-11-14 DIAGNOSIS — I129 Hypertensive chronic kidney disease with stage 1 through stage 4 chronic kidney disease, or unspecified chronic kidney disease: Secondary | ICD-10-CM | POA: Diagnosis not present

## 2020-11-14 DIAGNOSIS — R531 Weakness: Secondary | ICD-10-CM | POA: Diagnosis not present

## 2020-11-14 DIAGNOSIS — Z86718 Personal history of other venous thrombosis and embolism: Secondary | ICD-10-CM | POA: Diagnosis not present

## 2020-11-14 DIAGNOSIS — L89322 Pressure ulcer of left buttock, stage 2: Secondary | ICD-10-CM | POA: Diagnosis not present

## 2020-11-14 DIAGNOSIS — Z79899 Other long term (current) drug therapy: Secondary | ICD-10-CM | POA: Diagnosis not present

## 2020-11-14 DIAGNOSIS — G629 Polyneuropathy, unspecified: Secondary | ICD-10-CM | POA: Diagnosis not present

## 2020-11-14 DIAGNOSIS — A046 Enteritis due to Yersinia enterocolitica: Secondary | ICD-10-CM | POA: Diagnosis not present

## 2020-11-14 DIAGNOSIS — K219 Gastro-esophageal reflux disease without esophagitis: Secondary | ICD-10-CM | POA: Diagnosis not present

## 2020-11-14 DIAGNOSIS — M6281 Muscle weakness (generalized): Secondary | ICD-10-CM | POA: Diagnosis not present

## 2020-11-14 DIAGNOSIS — R2689 Other abnormalities of gait and mobility: Secondary | ICD-10-CM | POA: Diagnosis not present

## 2020-11-14 DIAGNOSIS — E785 Hyperlipidemia, unspecified: Secondary | ICD-10-CM | POA: Diagnosis not present

## 2020-11-14 DIAGNOSIS — Z79891 Long term (current) use of opiate analgesic: Secondary | ICD-10-CM | POA: Diagnosis not present

## 2020-11-14 DIAGNOSIS — F329 Major depressive disorder, single episode, unspecified: Secondary | ICD-10-CM | POA: Diagnosis not present

## 2020-11-14 DIAGNOSIS — L89892 Pressure ulcer of other site, stage 2: Secondary | ICD-10-CM | POA: Diagnosis not present

## 2020-11-14 DIAGNOSIS — Z7902 Long term (current) use of antithrombotics/antiplatelets: Secondary | ICD-10-CM | POA: Diagnosis not present

## 2020-11-14 DIAGNOSIS — Z792 Long term (current) use of antibiotics: Secondary | ICD-10-CM | POA: Diagnosis not present

## 2020-11-14 DIAGNOSIS — M7130 Other bursal cyst, unspecified site: Secondary | ICD-10-CM | POA: Diagnosis not present

## 2020-11-14 DIAGNOSIS — E1122 Type 2 diabetes mellitus with diabetic chronic kidney disease: Secondary | ICD-10-CM | POA: Diagnosis not present

## 2020-11-14 DIAGNOSIS — R278 Other lack of coordination: Secondary | ICD-10-CM | POA: Diagnosis not present

## 2020-11-14 DIAGNOSIS — C7490 Malignant neoplasm of unspecified part of unspecified adrenal gland: Secondary | ICD-10-CM | POA: Diagnosis not present

## 2020-11-14 DIAGNOSIS — N183 Chronic kidney disease, stage 3 unspecified: Secondary | ICD-10-CM | POA: Diagnosis not present

## 2020-11-15 DIAGNOSIS — E11622 Type 2 diabetes mellitus with other skin ulcer: Secondary | ICD-10-CM | POA: Diagnosis not present

## 2020-11-15 DIAGNOSIS — J9610 Chronic respiratory failure, unspecified whether with hypoxia or hypercapnia: Secondary | ICD-10-CM | POA: Diagnosis not present

## 2020-11-15 DIAGNOSIS — Z6828 Body mass index (BMI) 28.0-28.9, adult: Secondary | ICD-10-CM | POA: Diagnosis not present

## 2020-11-15 DIAGNOSIS — R110 Nausea: Secondary | ICD-10-CM | POA: Diagnosis not present

## 2020-11-15 DIAGNOSIS — I700 Atherosclerosis of aorta: Secondary | ICD-10-CM | POA: Diagnosis not present

## 2020-11-15 DIAGNOSIS — J449 Chronic obstructive pulmonary disease, unspecified: Secondary | ICD-10-CM | POA: Diagnosis not present

## 2020-11-15 NOTE — Progress Notes (Signed)
  Subjective:  Patient ID: Anna Stone, female    DOB: February 26, 1941,  MRN: [id number]  Chief Complaint  Patient presents with  . Nail Problem    trim nails     79 y.o. female presents with the above complaint. History confirmed with patient.   Marco Collie, MD is PCP. Unknown date last seen.  Objective:  Physical Exam: warm, good capillary refill, nail exam onychomycosis of the toenails, no trophic changes or ulcerative lesions. DP pulses palpable and protective sensation intact , reduced PT pulses Left Foot: normal exam, no swelling, tenderness, instability; ligaments intact, full range of motion of all ankle/foot joints  Right Foot: normal exam, no swelling, tenderness, instability; ligaments intact, full range of motion of all ankle/foot joints   No images are attached to the encounter.  Assessment:   1. Onychomycosis of multiple toenails with type 2 diabetes mellitus and peripheral angiopathy ([place])      Plan:  Patient was evaluated and treated and all questions answered.  Onychomycosis, Diabetes and PAD -Patient is diabetic with a qualifying condition for at risk foot care.  Procedure: Nail Debridement Type of Debridement: manual, sharp debridement. Instrumentation: Nail nipper, rotary burr. Number of Nails: 10  No follow-ups on file.

## 2020-11-17 DIAGNOSIS — L89892 Pressure ulcer of other site, stage 2: Secondary | ICD-10-CM | POA: Diagnosis not present

## 2020-11-17 DIAGNOSIS — L89322 Pressure ulcer of left buttock, stage 2: Secondary | ICD-10-CM | POA: Diagnosis not present

## 2020-11-17 DIAGNOSIS — G629 Polyneuropathy, unspecified: Secondary | ICD-10-CM | POA: Diagnosis not present

## 2020-11-17 DIAGNOSIS — N183 Chronic kidney disease, stage 3 unspecified: Secondary | ICD-10-CM | POA: Diagnosis not present

## 2020-11-17 DIAGNOSIS — E1122 Type 2 diabetes mellitus with diabetic chronic kidney disease: Secondary | ICD-10-CM | POA: Diagnosis not present

## 2020-11-17 DIAGNOSIS — I129 Hypertensive chronic kidney disease with stage 1 through stage 4 chronic kidney disease, or unspecified chronic kidney disease: Secondary | ICD-10-CM | POA: Diagnosis not present

## 2020-11-20 DIAGNOSIS — G629 Polyneuropathy, unspecified: Secondary | ICD-10-CM | POA: Diagnosis not present

## 2020-11-20 DIAGNOSIS — N183 Chronic kidney disease, stage 3 unspecified: Secondary | ICD-10-CM | POA: Diagnosis not present

## 2020-11-20 DIAGNOSIS — L89892 Pressure ulcer of other site, stage 2: Secondary | ICD-10-CM | POA: Diagnosis not present

## 2020-11-20 DIAGNOSIS — E1122 Type 2 diabetes mellitus with diabetic chronic kidney disease: Secondary | ICD-10-CM | POA: Diagnosis not present

## 2020-11-20 DIAGNOSIS — L89322 Pressure ulcer of left buttock, stage 2: Secondary | ICD-10-CM | POA: Diagnosis not present

## 2020-11-20 DIAGNOSIS — I129 Hypertensive chronic kidney disease with stage 1 through stage 4 chronic kidney disease, or unspecified chronic kidney disease: Secondary | ICD-10-CM | POA: Diagnosis not present

## 2020-11-22 DIAGNOSIS — E1122 Type 2 diabetes mellitus with diabetic chronic kidney disease: Secondary | ICD-10-CM | POA: Diagnosis not present

## 2020-11-22 DIAGNOSIS — L89322 Pressure ulcer of left buttock, stage 2: Secondary | ICD-10-CM | POA: Diagnosis not present

## 2020-11-22 DIAGNOSIS — I129 Hypertensive chronic kidney disease with stage 1 through stage 4 chronic kidney disease, or unspecified chronic kidney disease: Secondary | ICD-10-CM | POA: Diagnosis not present

## 2020-11-22 DIAGNOSIS — G629 Polyneuropathy, unspecified: Secondary | ICD-10-CM | POA: Diagnosis not present

## 2020-11-22 DIAGNOSIS — L89892 Pressure ulcer of other site, stage 2: Secondary | ICD-10-CM | POA: Diagnosis not present

## 2020-11-22 DIAGNOSIS — N183 Chronic kidney disease, stage 3 unspecified: Secondary | ICD-10-CM | POA: Diagnosis not present

## 2020-11-24 DIAGNOSIS — N183 Chronic kidney disease, stage 3 unspecified: Secondary | ICD-10-CM | POA: Diagnosis not present

## 2020-11-24 DIAGNOSIS — L89892 Pressure ulcer of other site, stage 2: Secondary | ICD-10-CM | POA: Diagnosis not present

## 2020-11-24 DIAGNOSIS — G629 Polyneuropathy, unspecified: Secondary | ICD-10-CM | POA: Diagnosis not present

## 2020-11-24 DIAGNOSIS — E1122 Type 2 diabetes mellitus with diabetic chronic kidney disease: Secondary | ICD-10-CM | POA: Diagnosis not present

## 2020-11-24 DIAGNOSIS — I129 Hypertensive chronic kidney disease with stage 1 through stage 4 chronic kidney disease, or unspecified chronic kidney disease: Secondary | ICD-10-CM | POA: Diagnosis not present

## 2020-11-24 DIAGNOSIS — L89322 Pressure ulcer of left buttock, stage 2: Secondary | ICD-10-CM | POA: Diagnosis not present

## 2020-11-27 DIAGNOSIS — N183 Chronic kidney disease, stage 3 unspecified: Secondary | ICD-10-CM | POA: Diagnosis not present

## 2020-11-27 DIAGNOSIS — I129 Hypertensive chronic kidney disease with stage 1 through stage 4 chronic kidney disease, or unspecified chronic kidney disease: Secondary | ICD-10-CM | POA: Diagnosis not present

## 2020-11-27 DIAGNOSIS — L89322 Pressure ulcer of left buttock, stage 2: Secondary | ICD-10-CM | POA: Diagnosis not present

## 2020-11-27 DIAGNOSIS — E1122 Type 2 diabetes mellitus with diabetic chronic kidney disease: Secondary | ICD-10-CM | POA: Diagnosis not present

## 2020-11-27 DIAGNOSIS — G629 Polyneuropathy, unspecified: Secondary | ICD-10-CM | POA: Diagnosis not present

## 2020-11-27 DIAGNOSIS — L89892 Pressure ulcer of other site, stage 2: Secondary | ICD-10-CM | POA: Diagnosis not present

## 2020-11-29 DIAGNOSIS — I129 Hypertensive chronic kidney disease with stage 1 through stage 4 chronic kidney disease, or unspecified chronic kidney disease: Secondary | ICD-10-CM | POA: Diagnosis not present

## 2020-11-29 DIAGNOSIS — N183 Chronic kidney disease, stage 3 unspecified: Secondary | ICD-10-CM | POA: Diagnosis not present

## 2020-11-29 DIAGNOSIS — G629 Polyneuropathy, unspecified: Secondary | ICD-10-CM | POA: Diagnosis not present

## 2020-11-29 DIAGNOSIS — E1122 Type 2 diabetes mellitus with diabetic chronic kidney disease: Secondary | ICD-10-CM | POA: Diagnosis not present

## 2020-11-29 DIAGNOSIS — L89892 Pressure ulcer of other site, stage 2: Secondary | ICD-10-CM | POA: Diagnosis not present

## 2020-11-29 DIAGNOSIS — L89322 Pressure ulcer of left buttock, stage 2: Secondary | ICD-10-CM | POA: Diagnosis not present

## 2020-11-30 DIAGNOSIS — L89892 Pressure ulcer of other site, stage 2: Secondary | ICD-10-CM | POA: Diagnosis not present

## 2020-11-30 DIAGNOSIS — F339 Major depressive disorder, recurrent, unspecified: Secondary | ICD-10-CM | POA: Diagnosis not present

## 2020-11-30 DIAGNOSIS — E860 Dehydration: Secondary | ICD-10-CM | POA: Diagnosis not present

## 2020-11-30 DIAGNOSIS — N183 Chronic kidney disease, stage 3 unspecified: Secondary | ICD-10-CM | POA: Diagnosis not present

## 2020-11-30 DIAGNOSIS — L89322 Pressure ulcer of left buttock, stage 2: Secondary | ICD-10-CM | POA: Diagnosis not present

## 2020-11-30 DIAGNOSIS — R110 Nausea: Secondary | ICD-10-CM | POA: Diagnosis not present

## 2020-11-30 DIAGNOSIS — R82998 Other abnormal findings in urine: Secondary | ICD-10-CM | POA: Diagnosis not present

## 2020-11-30 DIAGNOSIS — E1122 Type 2 diabetes mellitus with diabetic chronic kidney disease: Secondary | ICD-10-CM | POA: Diagnosis not present

## 2020-11-30 DIAGNOSIS — I129 Hypertensive chronic kidney disease with stage 1 through stage 4 chronic kidney disease, or unspecified chronic kidney disease: Secondary | ICD-10-CM | POA: Diagnosis not present

## 2020-11-30 DIAGNOSIS — G629 Polyneuropathy, unspecified: Secondary | ICD-10-CM | POA: Diagnosis not present

## 2020-12-04 DIAGNOSIS — N183 Chronic kidney disease, stage 3 unspecified: Secondary | ICD-10-CM | POA: Diagnosis not present

## 2020-12-04 DIAGNOSIS — L89322 Pressure ulcer of left buttock, stage 2: Secondary | ICD-10-CM | POA: Diagnosis not present

## 2020-12-04 DIAGNOSIS — G629 Polyneuropathy, unspecified: Secondary | ICD-10-CM | POA: Diagnosis not present

## 2020-12-04 DIAGNOSIS — E1122 Type 2 diabetes mellitus with diabetic chronic kidney disease: Secondary | ICD-10-CM | POA: Diagnosis not present

## 2020-12-04 DIAGNOSIS — L89892 Pressure ulcer of other site, stage 2: Secondary | ICD-10-CM | POA: Diagnosis not present

## 2020-12-04 DIAGNOSIS — I129 Hypertensive chronic kidney disease with stage 1 through stage 4 chronic kidney disease, or unspecified chronic kidney disease: Secondary | ICD-10-CM | POA: Diagnosis not present

## 2020-12-07 DIAGNOSIS — L89322 Pressure ulcer of left buttock, stage 2: Secondary | ICD-10-CM | POA: Diagnosis not present

## 2020-12-07 DIAGNOSIS — G629 Polyneuropathy, unspecified: Secondary | ICD-10-CM | POA: Diagnosis not present

## 2020-12-07 DIAGNOSIS — L89892 Pressure ulcer of other site, stage 2: Secondary | ICD-10-CM | POA: Diagnosis not present

## 2020-12-07 DIAGNOSIS — N183 Chronic kidney disease, stage 3 unspecified: Secondary | ICD-10-CM | POA: Diagnosis not present

## 2020-12-07 DIAGNOSIS — E1122 Type 2 diabetes mellitus with diabetic chronic kidney disease: Secondary | ICD-10-CM | POA: Diagnosis not present

## 2020-12-07 DIAGNOSIS — I129 Hypertensive chronic kidney disease with stage 1 through stage 4 chronic kidney disease, or unspecified chronic kidney disease: Secondary | ICD-10-CM | POA: Diagnosis not present

## 2020-12-12 DIAGNOSIS — R42 Dizziness and giddiness: Secondary | ICD-10-CM | POA: Diagnosis not present

## 2020-12-12 DIAGNOSIS — E860 Dehydration: Secondary | ICD-10-CM | POA: Diagnosis not present

## 2020-12-12 DIAGNOSIS — Z6829 Body mass index (BMI) 29.0-29.9, adult: Secondary | ICD-10-CM | POA: Diagnosis not present

## 2020-12-18 ENCOUNTER — Other Ambulatory Visit: Payer: Self-pay | Admitting: *Deleted

## 2020-12-18 NOTE — Patient Outreach (Signed)
[facility]) Care Management  [place]  12/18/2020   Anna Stone [facility] Anna Stone 21-Sep-1941 [id number]  RN Health Coach telephone call to patient.  Hipaa compliance verified. Per patient she has not been monitoring her blood sugar as much as she should. Patient stated that she monitors it on an average of 3 times a week. Patient stated it has been a long time since it had dropped and  her husband gave her orange juice. Patient stated she uses a rolling walker and tires easily. She has finished physical therapy but is not keeping up the exercises as she should.She has not fell lately. It has been over 3 weeks and she skinned her elbow. Patient stated she is not incontinent of urine but does have some problems with diarrhea off and on. Per patient she has sores on her lower extremity but they are healing. Patient stated her husband and son have been her caregiver. Per patient they have been real good to her. Patient has agreed to further outreach calls.    Encounter Medications:  Outpatient Encounter Medications as of 12/18/2020  Medication Sig  . bismuth subsalicylate (PEPTO-BISMOL) 262 MG/15ML suspension Take 30 mLs by mouth every 4 (four) hours as needed for diarrhea or loose stools.  . doxazosin (CARDURA) 1 MG tablet Take 1 mg by mouth at bedtime.  . feeding supplement, ENSURE ENLIVE, (ENSURE ENLIVE) LIQD Take 237 mLs by mouth 2 (two) times daily between meals.  . fluticasone (FLONASE) 50 MCG/ACT nasal spray Place 2 sprays into both nostrils daily as needed for allergies.   Marland Kitchen gabapentin (NEURONTIN) 600 MG tablet Take 600 mg by mouth 2 (two) times daily.   . metoprolol tartrate (LOPRESSOR) 25 MG tablet Take 1 tablet (25 mg total) by mouth 2 (two) times daily.  Marland Kitchen nystatin (MYCOSTATIN) 100000 UNIT/ML suspension Take 5 mLs (500,000 Units total) by mouth 4 (four) times daily.  Marland Kitchen omeprazole (PRILOSEC) 20 MG capsule Take 40 mg by mouth daily.   . ondansetron (ZOFRAN) 4 MG  tablet Take 1 tablet (4 mg total) by mouth every 6 (six) hours as needed for nausea.  . Rivaroxaban (XARELTO) 15 MG TABS tablet Take 1 tablet (15 mg total) by mouth 2 (two) times daily with a meal for 21 days.  . rivaroxaban (XARELTO) 20 MG TABS tablet Take 1 tablet (20 mg total) by mouth daily with supper.  . rosuvastatin (CRESTOR) 5 MG tablet Take 5 mg by mouth at bedtime.    No facility-administered encounter medications on file as of 12/18/2020.    Functional Status:  In your present state of health, do you have any difficulty performing the following activities: 09/02/2020  Hearing? N  Vision? N  Difficulty concentrating or making decisions? N  Walking or climbing stairs? Y  Dressing or bathing? N  Doing errands, shopping? Y  Comment husband drives  Some recent data might be hidden    Fall/Depression Screening: Fall Risk  07/16/2018 08/14/2016  Falls in the past year? No Yes  Comment Emmi Telephone Survey: data to providers prior to load Emmi Telephone Survey: data to providers prior to load  Number falls in past yr: - 1  Comment - Emmi Telephone Survey Actual Response = 1  Injury with Fall? - No   PHQ 2/9 Scores 12/18/2020  PHQ - 2 Score 1    Assessment:  Goals Addressed            This Visit's Progress   . (THN)Monitor and Manage My  Blood Sugar-Diabetes Type 2       Timeframe:  Short-Term Goal Priority:  High Start Date: 29562130                            Expected End Date:     86578469                  Follow Up Date [id number]   - check blood sugar at prescribed times - check blood sugar if I feel it is too high or too low - take the blood sugar log to all doctor visits - take the blood sugar meter to all doctor visits    Why is this important?    Checking your blood sugar at home helps to keep it from getting very high or very low.   Writing the results in a diary or log helps the doctor know how to care for you.   Your blood sugar log should have the time,  date and the results.   Also, write down the amount of insulin or other medicine that you take.   Other information, like what you ate, exercise done and how you were feeling, will also be helpful.     Notes:  Patient need to monitor blood sugars as prescribed. Per patient she is monitoring on an average of 3 x a week    . (THN)Set My Target A1C-Diabetes Type 2       Timeframe:  Short-Term Goal Priority:  High Start Date: 0132022                            Expected End Date:     32440102                  Follow Up Date [id number]   - set target A1C    Why is this important?    Your target A1C is decided together by you and your doctor.   It is based on several things like your age and other health issues.    Notes:        Plan:  Follow-up:  Patient agrees to Care Plan and Follow-up. RN discussed exercises that Physical therapy taught patient to do Patient will monitor blood sugars and document to take to DR RN sent a calendar book for documentation RN sent living well with Diabetes booklet RN discussed hypoglycemia and hyperglycemia with literature provided RN sent advanced directive packet RN sent update assessment and barrier letter to PCP RN will follow up outreach within the month of April  Gean Maidens BSN RN [facility] Care Management [phone]

## 2020-12-18 NOTE — Patient Instructions (Signed)
Goals Addressed            This Visit's Progress   . (THN)Monitor and Manage My Blood Sugar-Diabetes Type 2       Timeframe:  Short-Term Goal Priority:  High Start Date: 22025427                            Expected End Date:     06237628                  Follow Up Date [id number]   - check blood sugar at prescribed times - check blood sugar if I feel it is too high or too low - take the blood sugar log to all doctor visits - take the blood sugar meter to all doctor visits    Why is this important?    Checking your blood sugar at home helps to keep it from getting very high or very low.   Writing the results in a diary or log helps the doctor know how to care for you.   Your blood sugar log should have the time, date and the results.   Also, write down the amount of insulin or other medicine that you take.   Other information, like what you ate, exercise done and how you were feeling, will also be helpful.     Notes:  Patient need to monitor blood sugars as prescribed. Per patient she is monitoring on an average of 3 x a week    . (THN)Set My Target A1C-Diabetes Type 2       Timeframe:  Short-Term Goal Priority:  High Start Date: 0132022                            Expected End Date:     07371062                  Follow Up Date [id number]   - set target A1C    Why is this important?    Your target A1C is decided together by you and your doctor.   It is based on several things like your age and other health issues.    Notes:

## 2021-01-08 DIAGNOSIS — Z6831 Body mass index (BMI) 31.0-31.9, adult: Secondary | ICD-10-CM | POA: Diagnosis not present

## 2021-01-08 DIAGNOSIS — D692 Other nonthrombocytopenic purpura: Secondary | ICD-10-CM | POA: Diagnosis not present

## 2021-01-08 DIAGNOSIS — I700 Atherosclerosis of aorta: Secondary | ICD-10-CM | POA: Diagnosis not present

## 2021-01-08 DIAGNOSIS — Z23 Encounter for immunization: Secondary | ICD-10-CM | POA: Diagnosis not present

## 2021-01-08 DIAGNOSIS — R609 Edema, unspecified: Secondary | ICD-10-CM | POA: Diagnosis not present

## 2021-01-10 DIAGNOSIS — Z23 Encounter for immunization: Secondary | ICD-10-CM | POA: Diagnosis not present

## 2021-01-16 DIAGNOSIS — F339 Major depressive disorder, recurrent, unspecified: Secondary | ICD-10-CM | POA: Diagnosis not present

## 2021-01-16 DIAGNOSIS — J9610 Chronic respiratory failure, unspecified whether with hypoxia or hypercapnia: Secondary | ICD-10-CM | POA: Diagnosis not present

## 2021-01-16 DIAGNOSIS — I700 Atherosclerosis of aorta: Secondary | ICD-10-CM | POA: Diagnosis not present

## 2021-01-16 DIAGNOSIS — J449 Chronic obstructive pulmonary disease, unspecified: Secondary | ICD-10-CM | POA: Diagnosis not present

## 2021-02-05 DIAGNOSIS — R110 Nausea: Secondary | ICD-10-CM | POA: Diagnosis not present

## 2021-02-05 DIAGNOSIS — E1122 Type 2 diabetes mellitus with diabetic chronic kidney disease: Secondary | ICD-10-CM | POA: Diagnosis not present

## 2021-02-05 DIAGNOSIS — N183 Chronic kidney disease, stage 3 unspecified: Secondary | ICD-10-CM | POA: Diagnosis not present

## 2021-02-05 DIAGNOSIS — I129 Hypertensive chronic kidney disease with stage 1 through stage 4 chronic kidney disease, or unspecified chronic kidney disease: Secondary | ICD-10-CM | POA: Diagnosis not present

## 2021-02-13 DIAGNOSIS — I129 Hypertensive chronic kidney disease with stage 1 through stage 4 chronic kidney disease, or unspecified chronic kidney disease: Secondary | ICD-10-CM | POA: Diagnosis not present

## 2021-02-13 DIAGNOSIS — N183 Chronic kidney disease, stage 3 unspecified: Secondary | ICD-10-CM | POA: Diagnosis not present

## 2021-02-13 DIAGNOSIS — E1122 Type 2 diabetes mellitus with diabetic chronic kidney disease: Secondary | ICD-10-CM | POA: Diagnosis not present

## 2021-02-27 DIAGNOSIS — Z6830 Body mass index (BMI) 30.0-30.9, adult: Secondary | ICD-10-CM | POA: Diagnosis not present

## 2021-02-27 DIAGNOSIS — I129 Hypertensive chronic kidney disease with stage 1 through stage 4 chronic kidney disease, or unspecified chronic kidney disease: Secondary | ICD-10-CM | POA: Diagnosis not present

## 2021-02-27 DIAGNOSIS — E1122 Type 2 diabetes mellitus with diabetic chronic kidney disease: Secondary | ICD-10-CM | POA: Diagnosis not present

## 2021-02-27 DIAGNOSIS — N183 Chronic kidney disease, stage 3 unspecified: Secondary | ICD-10-CM | POA: Diagnosis not present

## 2021-03-09 DIAGNOSIS — N183 Chronic kidney disease, stage 3 unspecified: Secondary | ICD-10-CM | POA: Diagnosis not present

## 2021-03-09 DIAGNOSIS — I129 Hypertensive chronic kidney disease with stage 1 through stage 4 chronic kidney disease, or unspecified chronic kidney disease: Secondary | ICD-10-CM | POA: Diagnosis not present

## 2021-03-09 DIAGNOSIS — R002 Palpitations: Secondary | ICD-10-CM | POA: Diagnosis not present

## 2021-03-09 DIAGNOSIS — E1122 Type 2 diabetes mellitus with diabetic chronic kidney disease: Secondary | ICD-10-CM | POA: Diagnosis not present

## 2021-03-15 DIAGNOSIS — I129 Hypertensive chronic kidney disease with stage 1 through stage 4 chronic kidney disease, or unspecified chronic kidney disease: Secondary | ICD-10-CM | POA: Diagnosis not present

## 2021-03-16 DIAGNOSIS — J449 Chronic obstructive pulmonary disease, unspecified: Secondary | ICD-10-CM | POA: Diagnosis not present

## 2021-03-16 DIAGNOSIS — I129 Hypertensive chronic kidney disease with stage 1 through stage 4 chronic kidney disease, or unspecified chronic kidney disease: Secondary | ICD-10-CM | POA: Diagnosis not present

## 2021-03-16 DIAGNOSIS — F329 Major depressive disorder, single episode, unspecified: Secondary | ICD-10-CM | POA: Diagnosis not present

## 2021-03-16 DIAGNOSIS — E1169 Type 2 diabetes mellitus with other specified complication: Secondary | ICD-10-CM | POA: Diagnosis not present

## 2021-03-20 ENCOUNTER — Other Ambulatory Visit: Payer: Self-pay | Admitting: *Deleted

## 2021-03-20 ENCOUNTER — Encounter: Payer: Self-pay | Admitting: *Deleted

## 2021-03-20 NOTE — Patient Instructions (Signed)
Goals Addressed            This Visit's Progress   . (THN)Monitor and Manage My Blood Sugar-Diabetes Type 2       Timeframe:  Short-Term Goal Priority:  High Start Date: 16109604                            Expected End Date:     54098119         Follow Up Date 14782956   - check blood sugar at prescribed times - check blood sugar if I feel it is too high or too low - take the blood sugar log to all doctor visits - take the blood sugar meter to all doctor visits    Why is this important?    Checking your blood sugar at home helps to keep it from getting very high or very low.   Writing the results in a diary or log helps the doctor know how to care for you.   Your blood sugar log should have the time, date and the results.   Also, write down the amount of insulin or other medicine that you take.   Other information, like what you ate, exercise done and how you were feeling, will also be helpful.     Notes:  Patient need to monitor blood sugars as prescribed. Per patient she is monitoring on an average of 3 x a week [id number] Patient is monitoring blood sugars every other day    . (THN)Set My Target A1C-Diabetes Type 2       Timeframe:  Short-Term Goal Priority:  High Start Date: 0132022                            Expected End Date:     84696295       Follow Up Date [id number]   - set target A1C    Why is this important?    Your target A1C is decided together by you and your doctor.   It is based on several things like your age and other health issues.    Notes:  A1C goal 7.0 [id number] A1C is 7.2

## 2021-03-20 NOTE — Patient Outreach (Signed)
[facility]) Care Management  03/20/2021  [place] 1941-12-08 [id number]   RN Health Coach attempted follow up outreach call to patient.  Patient was unavailable. HIPPA compliance voicemail message left with return callback number.  Plan: RN will call patient again within 30 days.  [facility] Care Management [phone]

## 2021-03-20 NOTE — Patient Outreach (Signed)
[place] [facility]) Care Management  [place]  03/20/2021   Anna Stone [place] 1940-12-18 [id number]  RN Health Coach telephone call to patient.  Hipaa compliance verified. Per patient she had not checked her blood sugar today. Patient stated that she checks her blood sugar every other day. Last blood sugar was 106. Patient stated she had not had any episodes of low blood sugar in a while. She stated that she has gained her weight back and more. She is having swelling in her feet , ankles and legs. She has been wearing compression hose. She elevates her feet when sitting. She has not had any recent falls. She uses a walker in the house and a cane outside. Per patient her feet have healed. Patient stated she has been having some problems with her blood pressure being elevated. Patient has agreed to further outreach calls.   Encounter Medications:  Outpatient Encounter Medications as of 03/20/2021  Medication Sig  . furosemide (LASIX) 20 MG tablet Take 20 mg by mouth daily.  Marland Kitchen bismuth subsalicylate (PEPTO-BISMOL) 262 MG/15ML suspension Take 30 mLs by mouth every 4 (four) hours as needed for diarrhea or loose stools.  . doxazosin (CARDURA) 1 MG tablet Take 1 mg by mouth at bedtime.  . feeding supplement, ENSURE ENLIVE, (ENSURE ENLIVE) LIQD Take 237 mLs by mouth 2 (two) times daily between meals.  . fluticasone (FLONASE) 50 MCG/ACT nasal spray Place 2 sprays into both nostrils daily as needed for allergies.   Marland Kitchen gabapentin (NEURONTIN) 600 MG tablet Take 600 mg by mouth 2 (two) times daily.   . metoprolol tartrate (LOPRESSOR) 25 MG tablet Take 1 tablet (25 mg total) by mouth 2 (two) times daily.  Marland Kitchen nystatin (MYCOSTATIN) 100000 UNIT/ML suspension Take 5 mLs (500,000 Units total) by mouth 4 (four) times daily.  Marland Kitchen omeprazole (PRILOSEC) 20 MG capsule Take 40 mg by mouth daily.   . ondansetron (ZOFRAN) 4 MG tablet Take 1 tablet (4 mg total) by mouth every 6 (six) hours as needed  for nausea.  . Rivaroxaban (XARELTO) 15 MG TABS tablet Take 1 tablet (15 mg total) by mouth 2 (two) times daily with a meal for 21 days.  . rivaroxaban (XARELTO) 20 MG TABS tablet Take 1 tablet (20 mg total) by mouth daily with supper.  . rosuvastatin (CRESTOR) 5 MG tablet Take 5 mg by mouth at bedtime.    No facility-administered encounter medications on file as of 03/20/2021.    Functional Status:  In your present state of health, do you have any difficulty performing the following activities: 09/02/2020  Hearing? N  Vision? N  Difficulty concentrating or making decisions? N  Walking or climbing stairs? Y  Dressing or bathing? N  Doing errands, shopping? Y  Comment husband drives  Some recent data might be hidden    Fall/Depression Screening: Fall Risk  07/16/2018 08/14/2016  Falls in the past year? No Yes  Comment Emmi Telephone Survey: data to providers prior to load Emmi Telephone Survey: data to providers prior to load  Number falls in past yr: - 1  Comment - Emmi Telephone Survey Actual Response = 1  Injury with Fall? - No   PHQ 2/9 Scores 12/18/2020  PHQ - 2 Score 1    Assessment:  Goals Addressed            This Visit's Progress   . (THN)Monitor and Manage My Blood Sugar-Diabetes Type 2       Timeframe:  Short-Term Goal  Priority:  High Start Date: 51025852                            Expected End Date:     77824235         Follow Up Date 36144315   - check blood sugar at prescribed times - check blood sugar if I feel it is too high or too low - take the blood sugar log to all doctor visits - take the blood sugar meter to all doctor visits    Why is this important?    Checking your blood sugar at home helps to keep it from getting very high or very low.   Writing the results in a diary or log helps the doctor know how to care for you.   Your blood sugar log should have the time, date and the results.   Also, write down the amount of insulin or other medicine  that you take.   Other information, like what you ate, exercise done and how you were feeling, will also be helpful.     Notes:  Patient need to monitor blood sugars as prescribed. Per patient she is monitoring on an average of 3 x a week [id number] Patient is monitoring blood sugars every other day    . (THN)Set My Target A1C-Diabetes Type 2       Timeframe:  Short-Term Goal Priority:  High Start Date: 0132022                            Expected End Date:     95093267       Follow Up Date [id number]   - set target A1C    Why is this important?    Your target A1C is decided together by you and your doctor.   It is based on several things like your age and other health issues.    Notes:  A1C goal 7.0 [id number] A1C is 7.2       Plan:  Follow-up:  Patient agrees to Care Plan and Follow-up. RN ordered free COVID government testing kits Provided A matter of choice blood pressure control Provided a picture sheet of foods high and low in sodium RN sent update assessment to PCP RN will follow up within the month of October RN sent [facility] [phone]

## 2021-03-26 DIAGNOSIS — E1169 Type 2 diabetes mellitus with other specified complication: Secondary | ICD-10-CM | POA: Diagnosis not present

## 2021-04-02 DIAGNOSIS — E1122 Type 2 diabetes mellitus with diabetic chronic kidney disease: Secondary | ICD-10-CM | POA: Diagnosis not present

## 2021-04-02 DIAGNOSIS — I129 Hypertensive chronic kidney disease with stage 1 through stage 4 chronic kidney disease, or unspecified chronic kidney disease: Secondary | ICD-10-CM | POA: Diagnosis not present

## 2021-04-02 DIAGNOSIS — E782 Mixed hyperlipidemia: Secondary | ICD-10-CM | POA: Diagnosis not present

## 2021-04-02 DIAGNOSIS — E1169 Type 2 diabetes mellitus with other specified complication: Secondary | ICD-10-CM | POA: Diagnosis not present

## 2021-04-15 DIAGNOSIS — I129 Hypertensive chronic kidney disease with stage 1 through stage 4 chronic kidney disease, or unspecified chronic kidney disease: Secondary | ICD-10-CM | POA: Diagnosis not present

## 2021-04-15 DIAGNOSIS — F329 Major depressive disorder, single episode, unspecified: Secondary | ICD-10-CM | POA: Diagnosis not present

## 2021-04-15 DIAGNOSIS — E1169 Type 2 diabetes mellitus with other specified complication: Secondary | ICD-10-CM | POA: Diagnosis not present

## 2021-05-07 ENCOUNTER — Ambulatory Visit (INDEPENDENT_AMBULATORY_CARE_PROVIDER_SITE_OTHER): Payer: Medicare Other | Admitting: Podiatry

## 2021-05-07 ENCOUNTER — Other Ambulatory Visit: Payer: Self-pay

## 2021-05-07 DIAGNOSIS — B351 Tinea unguium: Secondary | ICD-10-CM

## 2021-05-07 DIAGNOSIS — E1169 Type 2 diabetes mellitus with other specified complication: Secondary | ICD-10-CM | POA: Diagnosis not present

## 2021-05-07 DIAGNOSIS — E1151 Type 2 diabetes mellitus with diabetic peripheral angiopathy without gangrene: Secondary | ICD-10-CM

## 2021-05-07 NOTE — Progress Notes (Signed)
  Subjective:  Patient ID: Anna Stone, female    DOB: Oct 20, 1941,  MRN: [id number]  Chief Complaint  Patient presents with  . debride    DFC -FBS: 106 a1C: 5 PCP: Janalyn Harder x 04-15-21    80 y.o. female presents with the above complaint. History confirmed with patient.   Marco Collie, MD is PCP. Unknown date last seen.  Objective:  Physical Exam: warm, good capillary refill, nail exam onychomycosis of the toenails, no trophic changes or ulcerative lesions. DP pulses palpable and protective sensation intact , reduced PT pulses Left Foot: normal exam, no swelling, tenderness, instability; ligaments intact, full range of motion of all ankle/foot joints  Right Foot: normal exam, no swelling, tenderness, instability; ligaments intact, full range of motion of all ankle/foot joints   No images are attached to the encounter.  Assessment:   1. Onychomycosis of multiple toenails with type 2 diabetes mellitus and peripheral angiopathy ([place])    Plan:  Patient was evaluated and treated and all questions answered.  Onychomycosis, Diabetes and PAD -Patient is diabetic with a qualifying condition for at risk foot care.  Procedure: Nail Debridement Type of Debridement: manual, sharp debridement. Instrumentation: Nail nipper, rotary burr. Number of Nails: 10  Return in about 6 months (around 11/07/2021) for Diabetic Foot Care.

## 2021-05-16 DIAGNOSIS — F329 Major depressive disorder, single episode, unspecified: Secondary | ICD-10-CM | POA: Diagnosis not present

## 2021-05-16 DIAGNOSIS — E1169 Type 2 diabetes mellitus with other specified complication: Secondary | ICD-10-CM | POA: Diagnosis not present

## 2021-05-16 DIAGNOSIS — I129 Hypertensive chronic kidney disease with stage 1 through stage 4 chronic kidney disease, or unspecified chronic kidney disease: Secondary | ICD-10-CM | POA: Diagnosis not present

## 2021-06-05 DIAGNOSIS — M170 Bilateral primary osteoarthritis of knee: Secondary | ICD-10-CM | POA: Diagnosis not present

## 2021-06-15 DIAGNOSIS — I129 Hypertensive chronic kidney disease with stage 1 through stage 4 chronic kidney disease, or unspecified chronic kidney disease: Secondary | ICD-10-CM | POA: Diagnosis not present

## 2021-06-15 DIAGNOSIS — F329 Major depressive disorder, single episode, unspecified: Secondary | ICD-10-CM | POA: Diagnosis not present

## 2021-06-15 DIAGNOSIS — E1169 Type 2 diabetes mellitus with other specified complication: Secondary | ICD-10-CM | POA: Diagnosis not present

## 2021-07-16 DIAGNOSIS — E1169 Type 2 diabetes mellitus with other specified complication: Secondary | ICD-10-CM | POA: Diagnosis not present

## 2021-07-16 DIAGNOSIS — F329 Major depressive disorder, single episode, unspecified: Secondary | ICD-10-CM | POA: Diagnosis not present

## 2021-07-16 DIAGNOSIS — I129 Hypertensive chronic kidney disease with stage 1 through stage 4 chronic kidney disease, or unspecified chronic kidney disease: Secondary | ICD-10-CM | POA: Diagnosis not present

## 2021-08-06 DIAGNOSIS — E1169 Type 2 diabetes mellitus with other specified complication: Secondary | ICD-10-CM | POA: Diagnosis not present

## 2021-08-13 DIAGNOSIS — Z136 Encounter for screening for cardiovascular disorders: Secondary | ICD-10-CM | POA: Diagnosis not present

## 2021-08-13 DIAGNOSIS — Z1339 Encounter for screening examination for other mental health and behavioral disorders: Secondary | ICD-10-CM | POA: Diagnosis not present

## 2021-08-13 DIAGNOSIS — E1122 Type 2 diabetes mellitus with diabetic chronic kidney disease: Secondary | ICD-10-CM | POA: Diagnosis not present

## 2021-08-13 DIAGNOSIS — Z1331 Encounter for screening for depression: Secondary | ICD-10-CM | POA: Diagnosis not present

## 2021-08-13 DIAGNOSIS — E782 Mixed hyperlipidemia: Secondary | ICD-10-CM | POA: Diagnosis not present

## 2021-08-13 DIAGNOSIS — Z0000 Encounter for general adult medical examination without abnormal findings: Secondary | ICD-10-CM | POA: Diagnosis not present

## 2021-08-13 DIAGNOSIS — Z23 Encounter for immunization: Secondary | ICD-10-CM | POA: Diagnosis not present

## 2021-08-13 DIAGNOSIS — Z139 Encounter for screening, unspecified: Secondary | ICD-10-CM | POA: Diagnosis not present

## 2021-08-13 DIAGNOSIS — I129 Hypertensive chronic kidney disease with stage 1 through stage 4 chronic kidney disease, or unspecified chronic kidney disease: Secondary | ICD-10-CM | POA: Diagnosis not present

## 2021-08-13 DIAGNOSIS — E1169 Type 2 diabetes mellitus with other specified complication: Secondary | ICD-10-CM | POA: Diagnosis not present

## 2021-08-16 DIAGNOSIS — E1169 Type 2 diabetes mellitus with other specified complication: Secondary | ICD-10-CM | POA: Diagnosis not present

## 2021-08-16 DIAGNOSIS — I129 Hypertensive chronic kidney disease with stage 1 through stage 4 chronic kidney disease, or unspecified chronic kidney disease: Secondary | ICD-10-CM | POA: Diagnosis not present

## 2021-08-16 DIAGNOSIS — F329 Major depressive disorder, single episode, unspecified: Secondary | ICD-10-CM | POA: Diagnosis not present

## 2021-09-15 DIAGNOSIS — E1169 Type 2 diabetes mellitus with other specified complication: Secondary | ICD-10-CM | POA: Diagnosis not present

## 2021-09-15 DIAGNOSIS — I129 Hypertensive chronic kidney disease with stage 1 through stage 4 chronic kidney disease, or unspecified chronic kidney disease: Secondary | ICD-10-CM | POA: Diagnosis not present

## 2021-09-15 DIAGNOSIS — F329 Major depressive disorder, single episode, unspecified: Secondary | ICD-10-CM | POA: Diagnosis not present

## 2021-09-19 ENCOUNTER — Other Ambulatory Visit: Payer: Self-pay | Admitting: *Deleted

## 2021-09-19 NOTE — Patient Outreach (Signed)
[facility]) Care Management  [place]  09/19/2021   Anna Stone [facility] Anna Stone 1941/03/14 [id number]  RN Health Coach telephone call to patient.  Hipaa compliance verified. Per patient her A1C is 5.8. Patient has not checked her fasting blood sugar today. Patient has followed up with health maintenance of eye exam, podiatrist visit and flu vaccine. Patient is drinking ensure nutritional supplement. Patient uses a cane when she goes out. She uses a walker when she first gets up to get balance. She is able to do her house work but no extra exercising. Patient has agreed to follow up outreach calls.  Encounter Medications:  Outpatient Encounter Medications as of 09/19/2021  Medication Sig   bismuth subsalicylate (PEPTO-BISMOL) 262 MG/15ML suspension Take 30 mLs by mouth every 4 (four) hours as needed for diarrhea or loose stools.   doxazosin (CARDURA) 1 MG tablet Take 1 mg by mouth at bedtime.   feeding supplement, ENSURE ENLIVE, (ENSURE ENLIVE) LIQD Take 237 mLs by mouth 2 (two) times daily between meals.   fluticasone (FLONASE) 50 MCG/ACT nasal spray Place 2 sprays into both nostrils daily as needed for allergies.    furosemide (LASIX) 20 MG tablet Take 20 mg by mouth daily.   gabapentin (NEURONTIN) 600 MG tablet Take 600 mg by mouth 2 (two) times daily.    metoprolol tartrate (LOPRESSOR) 25 MG tablet Take 1 tablet (25 mg total) by mouth 2 (two) times daily.   nystatin (MYCOSTATIN) 100000 UNIT/ML suspension Take 5 mLs (500,000 Units total) by mouth 4 (four) times daily.   omeprazole (PRILOSEC) 20 MG capsule Take 40 mg by mouth daily.    ondansetron (ZOFRAN) 4 MG tablet Take 1 tablet (4 mg total) by mouth every 6 (six) hours as needed for nausea.   Rivaroxaban (XARELTO) 15 MG TABS tablet Take 1 tablet (15 mg total) by mouth 2 (two) times daily with a meal for 21 days.   rivaroxaban (XARELTO) 20 MG TABS tablet Take 1 tablet (20 mg total) by mouth daily with supper.    rosuvastatin (CRESTOR) 5 MG tablet Take 5 mg by mouth at bedtime.    No facility-administered encounter medications on file as of 09/19/2021.    Functional Status:  No flowsheet data found.  Fall/Depression Screening: Fall Risk  07/16/2018 08/14/2016  Falls in the past year? No Yes  Comment Emmi Telephone Survey: data to providers prior to load Emmi Telephone Survey: data to providers prior to load  Number falls in past yr: - 1  Comment - Emmi Telephone Survey Actual Response = 1  Injury with Fall? - No   PHQ 2/9 Scores 12/18/2020  PHQ - 2 Score 1    Assessment:   Care Plan Care Plan : Diabetes Type 2 (Adult)  Updates made by Verlin Grills, RN since 09/19/2021 12:00 AM     Problem: Glycemic Management (Diabetes, Type 2)   Priority: High  Onset Date: 12/18/2020     Long-Range Goal: Glycemic Management Optimized   Start Date: 12/18/2020  Expected End Date: 12/14/2022  This Visit's Progress: On track  Recent Progress: On track  Priority: Medium  Note:   Evidence-based guidance:  Anticipate A1C testing (point-of-care) every 3 to 6 months based on goal attainment.  Review mutually-set A1C goal or target range.  Anticipate use of antihyperglycemic with or without insulin and periodic adjustments; consider active involvement of pharmacist.  Provide medical nutrition therapy and development of individualized eating.  Compare self-reported symptoms of hypo or hyperglycemia  to blood glucose levels, diet and fluid intake, current medications, psychosocial and physiologic stressors, change in activity and barriers to care adherence.  Promote self-monitoring of blood glucose levels.  Assess and address barriers to management plan, such as food insecurity, age, developmental ability, depression, anxiety, fear of hypoglycemia or weight gain, as well as medication cost, side effects and complicated regimen.  Consider referral to community-based diabetes education program, visiting nurse,  community health worker or health coach.  Encourage regular dental care for treatment of periodontal disease; refer to dental provider when needed.   Notes:  [id number] Patient is following through with all health maintenance POD, eye exam     Task: Alleviate Barriers to Glycemic Management   Due Date: 12/14/2022  Note:   Care Management Activities:    - barriers to adherence to treatment plan identified - blood glucose monitoring encouraged - resources required to improve adherence to care identified - self-awareness of signs/symptoms of hypo or hyperglycemia encouraged - use of blood glucose monitoring log promoted    Notes:  RN sent a calendar book for documentation    Problem: Disease Progression (Diabetes, Type 2)   Priority: Medium  Onset Date: 12/18/2020     Long-Range Goal: Disease Progression Prevented or Minimized   Start Date: 12/18/2020  Expected End Date: 12/14/2022  This Visit's Progress: On track  Recent Progress: On track  Priority: Medium  Note:   Evidence-based guidance:  Prepare patient for laboratory and diagnostic exams based on risk and presentation.  Encourage lifestyle changes, such as increased intake of plant-based foods, stress reduction, consistent physical activity and smoking cessation to prevent long-term complications and chronic disease.   Individualize activity and exercise recommendations while considering potential limitations, such as neuropathy, retinopathy or the ability to prevent hyperglycemia or hypoglycemia.   Prepare patient for use of pharmacologic therapy that may include antihypertensive, analgesic, prostaglandin E1 with periodic adjustments, based on presenting chronic condition and laboratory results.  Assess signs/symptoms and risk factors for hypertension, sleep-disordered breathing, neuropathy (including changes in gait and balance), retinopathy, nephropathy and sexual dysfunction.  Address pregnancy planning and contraceptive  choice, especially when prescribing antihypertensive or statin.  Ensure completion of annual comprehensive foot exam and dilated eye exam.   Implement additional individualized goals and interventions based on identified risk factors.  Prepare patient for consultation or referral for specialist care, such as ophthalmology, neurology, cardiology, podiatry, nephrology or perinatology.   Notes:  [id number] Patient is following up with all health maintenance. She will follow up for 3rd COVID booster     Task: Monitor and Manage Follow-up for Comorbidities   Due Date: 12/14/2022  Note:   Care Management Activities:    - activity based on tolerance and functional limitations encouraged - completion of annual dilated eye exam confirmed - completion of annual foot exam verified - healthy lifestyle promoted - reduction of sedentary activity encouraged - signs/symptoms of comorbidities identified    Notes:       Goals Addressed             This Visit's Progress    (THN)Monitor and Manage My Blood Sugar-Diabetes Type 2   On track    Timeframe:  Short-Term Goal Priority:  High Start Date: [id number]                            Expected End Date:     [id number]  Follow Up Date [id number]   - check blood  sugar at prescribed times - check blood sugar if I feel it is too high or too low - take the blood sugar log to all doctor visits - take the blood sugar meter to all doctor visits    Why is this important?   Checking your blood sugar at home helps to keep it from getting very high or very low.  Writing the results in a diary or log helps the doctor know how to care for you.  Your blood sugar log should have the time, date and the results.  Also, write down the amount of insulin or other medicine that you take.  Other information, like what you ate, exercise done and how you were feeling, will also be helpful.     Notes:  Patient need to monitor blood sugars as prescribed. Per patient  she is monitoring on an average of 3 x a week [id number] Patient is monitoring blood sugars every other day     (THN)Set My Target A1C-Diabetes Type 2   On track    Timeframe:  Short-Term Goal Priority:  High Start Date: 0132022                            Expected End Date:     [id number]     Follow Up Date [id number]   - set target A1C    Why is this important?   Your target A1C is decided together by you and your doctor.  It is based on several things like your age and other health issues.    Notes:  A1C goal 7.0 [id number] A1C is 7.2 [id number] A1C 5.8        Plan:  Follow-up: Follow-up in 6 month(s) Patient will follow up with eye exam in January RN provided ensure coupons RN provided Guide for your journey with diabetes booklet RN sent update assessment to PCP  [place] [facility] [phone]

## 2021-09-19 NOTE — Patient Instructions (Signed)
Goals Addressed             This Visit's Progress    (THN)Monitor and Manage My Blood Sugar-Diabetes Type 2   On track    Timeframe:  Short-Term Goal Priority:  High Start Date: [id number]                            Expected End Date:     08144818  Follow Up Date [id number]   - check blood sugar at prescribed times - check blood sugar if I feel it is too high or too low - take the blood sugar log to all doctor visits - take the blood sugar meter to all doctor visits    Why is this important?   Checking your blood sugar at home helps to keep it from getting very high or very low.  Writing the results in a diary or log helps the doctor know how to care for you.  Your blood sugar log should have the time, date and the results.  Also, write down the amount of insulin or other medicine that you take.  Other information, like what you ate, exercise done and how you were feeling, will also be helpful.     Notes:  Patient need to monitor blood sugars as prescribed. Per patient she is monitoring on an average of 3 x a week [id number] Patient is monitoring blood sugars every other day     (THN)Set My Target A1C-Diabetes Type 2   On track    Timeframe:  Short-Term Goal Priority:  High Start Date: 0132022                            Expected End Date:     [id number]     Follow Up Date [id number]   - set target A1C    Why is this important?   Your target A1C is decided together by you and your doctor.  It is based on several things like your age and other health issues.    Notes:  A1C goal 7.0 [id number] A1C is 7.2 [id number] A1C 5.8

## 2021-10-16 DIAGNOSIS — E1169 Type 2 diabetes mellitus with other specified complication: Secondary | ICD-10-CM | POA: Diagnosis not present

## 2021-10-16 DIAGNOSIS — I129 Hypertensive chronic kidney disease with stage 1 through stage 4 chronic kidney disease, or unspecified chronic kidney disease: Secondary | ICD-10-CM | POA: Diagnosis not present

## 2021-10-16 DIAGNOSIS — F329 Major depressive disorder, single episode, unspecified: Secondary | ICD-10-CM | POA: Diagnosis not present

## 2021-11-12 ENCOUNTER — Ambulatory Visit (INDEPENDENT_AMBULATORY_CARE_PROVIDER_SITE_OTHER): Payer: Medicare Other | Admitting: Podiatry

## 2021-11-12 DIAGNOSIS — Z91199 Patient's noncompliance with other medical treatment and regimen due to unspecified reason: Secondary | ICD-10-CM

## 2021-11-15 DIAGNOSIS — F329 Major depressive disorder, single episode, unspecified: Secondary | ICD-10-CM | POA: Diagnosis not present

## 2021-11-15 DIAGNOSIS — I129 Hypertensive chronic kidney disease with stage 1 through stage 4 chronic kidney disease, or unspecified chronic kidney disease: Secondary | ICD-10-CM | POA: Diagnosis not present

## 2021-11-15 DIAGNOSIS — E1169 Type 2 diabetes mellitus with other specified complication: Secondary | ICD-10-CM | POA: Diagnosis not present

## 2021-11-19 ENCOUNTER — Encounter: Payer: Self-pay | Admitting: Podiatry

## 2021-11-19 ENCOUNTER — Ambulatory Visit (INDEPENDENT_AMBULATORY_CARE_PROVIDER_SITE_OTHER): Payer: Medicare Other | Admitting: Podiatry

## 2021-11-19 DIAGNOSIS — E1169 Type 2 diabetes mellitus with other specified complication: Secondary | ICD-10-CM | POA: Diagnosis not present

## 2021-11-19 DIAGNOSIS — E1151 Type 2 diabetes mellitus with diabetic peripheral angiopathy without gangrene: Secondary | ICD-10-CM

## 2021-11-19 DIAGNOSIS — B351 Tinea unguium: Secondary | ICD-10-CM | POA: Diagnosis not present

## 2021-11-19 NOTE — Progress Notes (Signed)
  Subjective:  Patient ID: Anna Stone, female    DOB: [birth year],  MRN: [id number]  Chief Complaint  Patient presents with   Nail Problem    I am here to get the toenails trimmed    80 y.o. female presents with the above complaint. History confirmed with patient.   Marco Collie, MD is PCP. Unknown date last seen.  Objective:  Physical Exam: warm, good capillary refill, nail exam onychomycosis of the toenails, no trophic changes or ulcerative lesions. DP pulses palpable and protective sensation intact , reduced PT pulses Left Foot: normal exam, no swelling, tenderness, instability; ligaments intact, full range of motion of all ankle/foot joints  Right Foot: normal exam, no swelling, tenderness, instability; ligaments intact, full range of motion of all ankle/foot joints   No images are attached to the encounter.  Assessment:   1. Onychomycosis of multiple toenails with type 2 diabetes mellitus and peripheral angiopathy ([place])     Plan:  Patient was evaluated and treated and all questions answered.  Onychomycosis, Diabetes and PAD -Patient is diabetic with a qualifying condition for at risk foot care.  Procedure: Nail Debridement Type of Debridement: manual, sharp debridement. Instrumentation: Nail nipper, rotary burr. Number of Nails: 10    No follow-ups on file.

## 2021-11-29 DIAGNOSIS — E1169 Type 2 diabetes mellitus with other specified complication: Secondary | ICD-10-CM | POA: Diagnosis not present

## 2021-11-29 DIAGNOSIS — Z23 Encounter for immunization: Secondary | ICD-10-CM | POA: Diagnosis not present

## 2021-12-03 DIAGNOSIS — E782 Mixed hyperlipidemia: Secondary | ICD-10-CM | POA: Diagnosis not present

## 2021-12-03 DIAGNOSIS — N183 Chronic kidney disease, stage 3 unspecified: Secondary | ICD-10-CM | POA: Diagnosis not present

## 2021-12-03 DIAGNOSIS — E1169 Type 2 diabetes mellitus with other specified complication: Secondary | ICD-10-CM | POA: Diagnosis not present

## 2021-12-03 DIAGNOSIS — E1122 Type 2 diabetes mellitus with diabetic chronic kidney disease: Secondary | ICD-10-CM | POA: Diagnosis not present

## 2021-12-03 DIAGNOSIS — I129 Hypertensive chronic kidney disease with stage 1 through stage 4 chronic kidney disease, or unspecified chronic kidney disease: Secondary | ICD-10-CM | POA: Diagnosis not present

## 2021-12-16 DIAGNOSIS — I129 Hypertensive chronic kidney disease with stage 1 through stage 4 chronic kidney disease, or unspecified chronic kidney disease: Secondary | ICD-10-CM | POA: Diagnosis not present

## 2021-12-16 DIAGNOSIS — J449 Chronic obstructive pulmonary disease, unspecified: Secondary | ICD-10-CM | POA: Diagnosis not present

## 2021-12-16 DIAGNOSIS — F339 Major depressive disorder, recurrent, unspecified: Secondary | ICD-10-CM | POA: Diagnosis not present

## 2021-12-16 DIAGNOSIS — E1169 Type 2 diabetes mellitus with other specified complication: Secondary | ICD-10-CM | POA: Diagnosis not present

## 2022-01-07 DIAGNOSIS — E119 Type 2 diabetes mellitus without complications: Secondary | ICD-10-CM | POA: Diagnosis not present

## 2022-01-14 DIAGNOSIS — E782 Mixed hyperlipidemia: Secondary | ICD-10-CM | POA: Diagnosis not present

## 2022-01-14 DIAGNOSIS — E1169 Type 2 diabetes mellitus with other specified complication: Secondary | ICD-10-CM | POA: Diagnosis not present

## 2022-01-14 DIAGNOSIS — Z6833 Body mass index (BMI) 33.0-33.9, adult: Secondary | ICD-10-CM | POA: Diagnosis not present

## 2022-01-16 DIAGNOSIS — E1169 Type 2 diabetes mellitus with other specified complication: Secondary | ICD-10-CM | POA: Diagnosis not present

## 2022-01-16 DIAGNOSIS — J449 Chronic obstructive pulmonary disease, unspecified: Secondary | ICD-10-CM | POA: Diagnosis not present

## 2022-01-16 DIAGNOSIS — I129 Hypertensive chronic kidney disease with stage 1 through stage 4 chronic kidney disease, or unspecified chronic kidney disease: Secondary | ICD-10-CM | POA: Diagnosis not present

## 2022-01-16 DIAGNOSIS — F339 Major depressive disorder, recurrent, unspecified: Secondary | ICD-10-CM | POA: Diagnosis not present

## 2022-01-24 DIAGNOSIS — M170 Bilateral primary osteoarthritis of knee: Secondary | ICD-10-CM | POA: Diagnosis not present

## 2022-02-13 DIAGNOSIS — F339 Major depressive disorder, recurrent, unspecified: Secondary | ICD-10-CM | POA: Diagnosis not present

## 2022-02-13 DIAGNOSIS — J449 Chronic obstructive pulmonary disease, unspecified: Secondary | ICD-10-CM | POA: Diagnosis not present

## 2022-02-13 DIAGNOSIS — I129 Hypertensive chronic kidney disease with stage 1 through stage 4 chronic kidney disease, or unspecified chronic kidney disease: Secondary | ICD-10-CM | POA: Diagnosis not present

## 2022-02-14 DIAGNOSIS — Z20822 Contact with and (suspected) exposure to covid-19: Secondary | ICD-10-CM | POA: Diagnosis not present

## 2022-02-16 IMAGING — CT CT ABD-PELV W/O CM
2 of 4 series · 15 of 46 positions shown, 17 images · non-contrast
Comparison: CT abdomen pelvis 07/25/2014

CLINICAL DATA: Acute abdominal pain, nonlocalized. White blood cell
count 18

EXAM:
CT ABDOMEN AND PELVIS WITHOUT CONTRAST
TECHNIQUE: Multidetector CT imaging of the abdomen and pelvis was performed
following the standard protocol without IV contrast.

[Series 3: abd/ pelvis 5.0 i30f 2 · axial · 0.69mm/px · z∈[+808,+1218]mm · 12 of 90 slices shown, 14 images]
[im 4/90  soft-tissue]
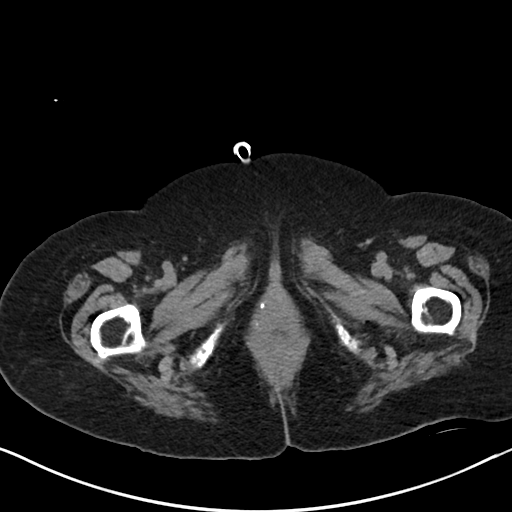
[im 4/90  bone]
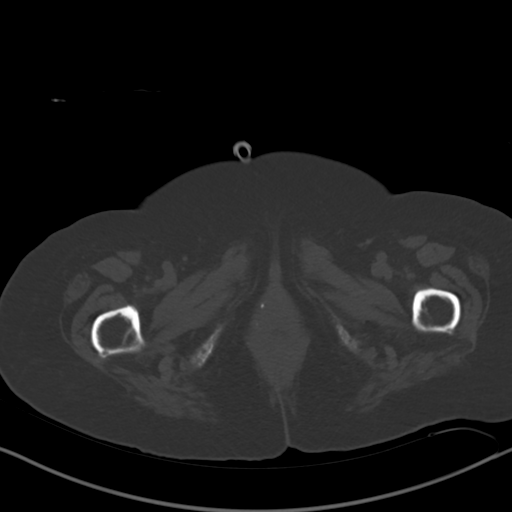
[im 12/90  soft-tissue]
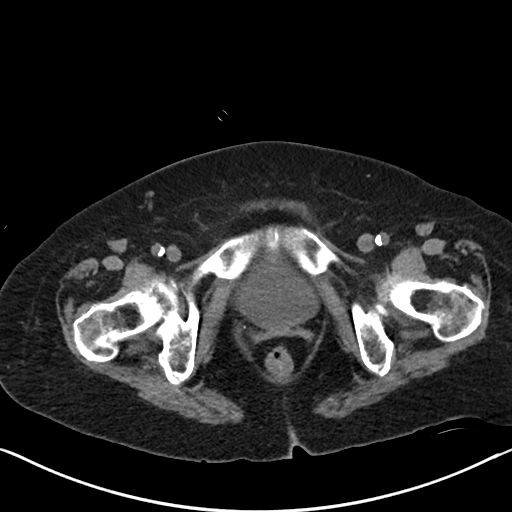
[im 20/90  soft-tissue]
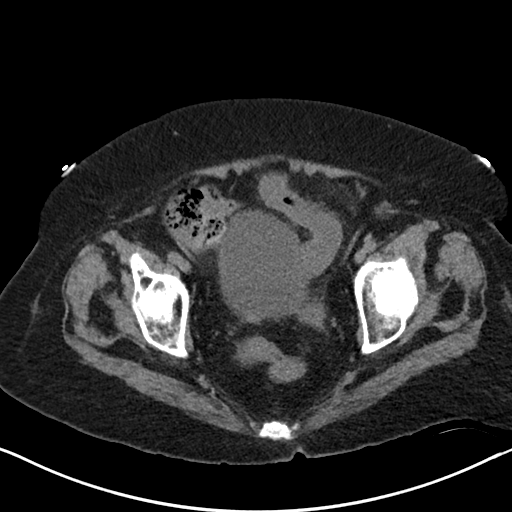
[im 28/90  soft-tissue]
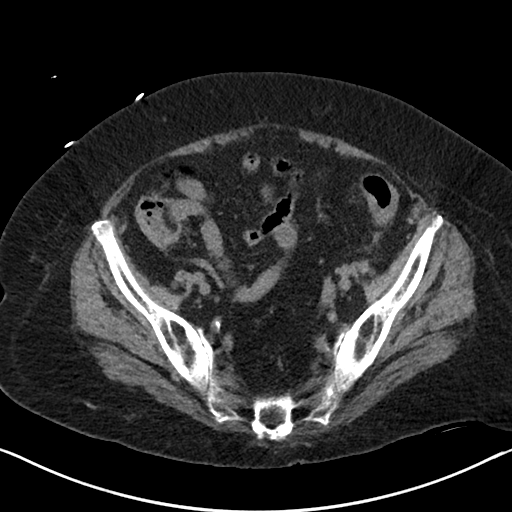
[im 35/90  soft-tissue]
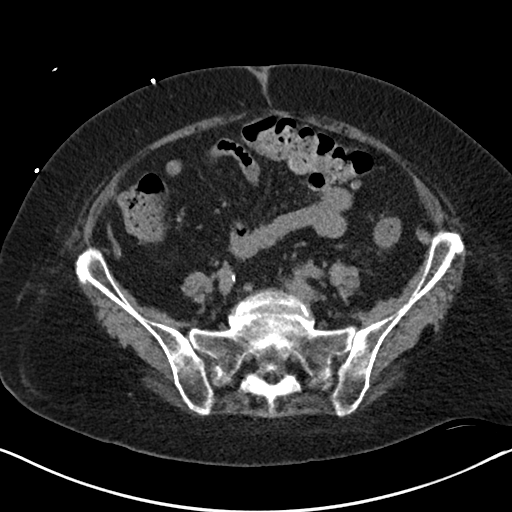
[im 43/90  soft-tissue]
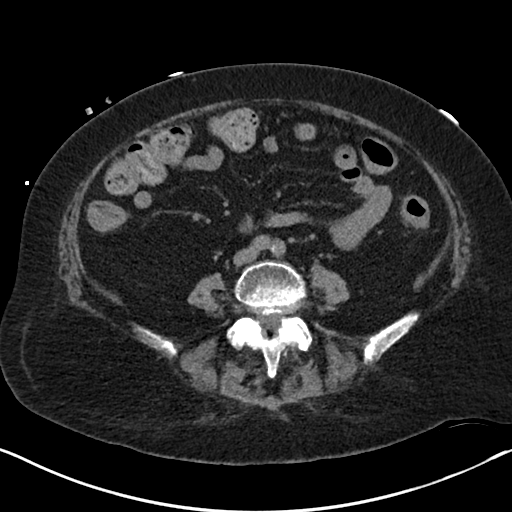
[im 47/90  soft-tissue]
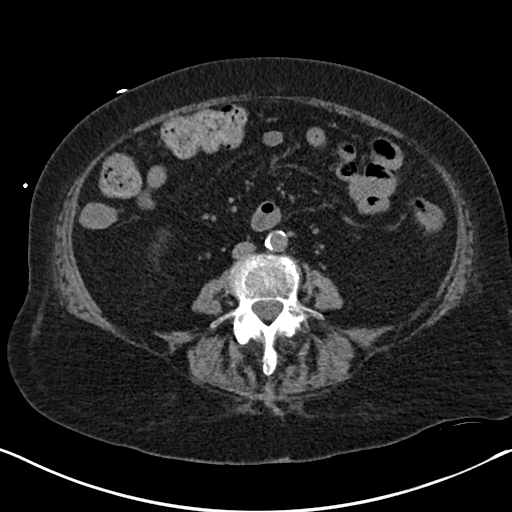
[im 55/90  soft-tissue]
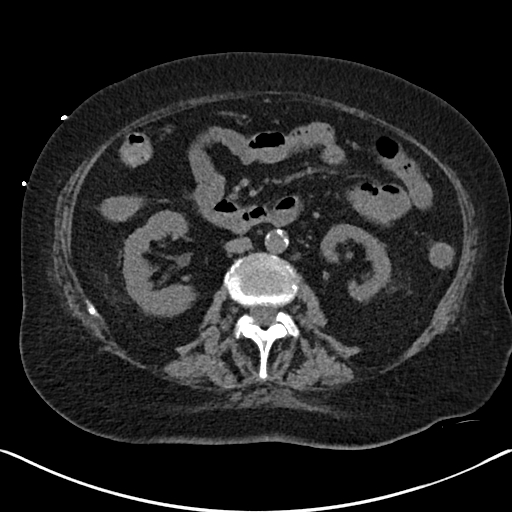
[im 62/90  soft-tissue]
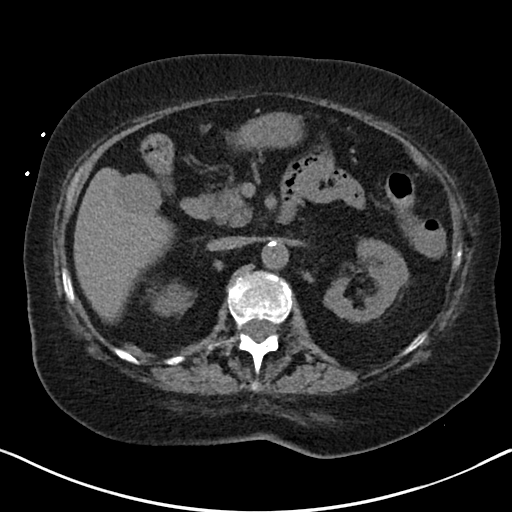
[im 62/90  bone]
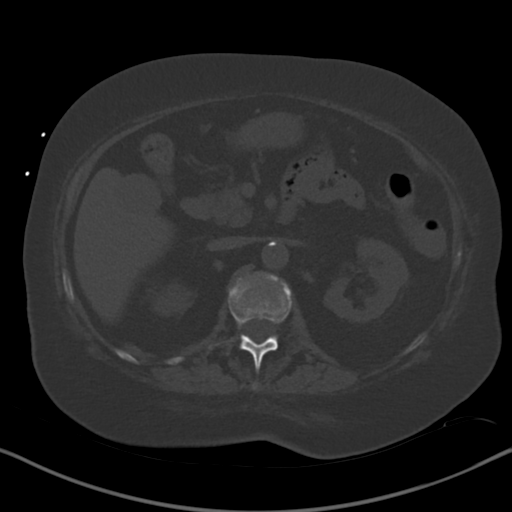
[im 70/90  soft-tissue]
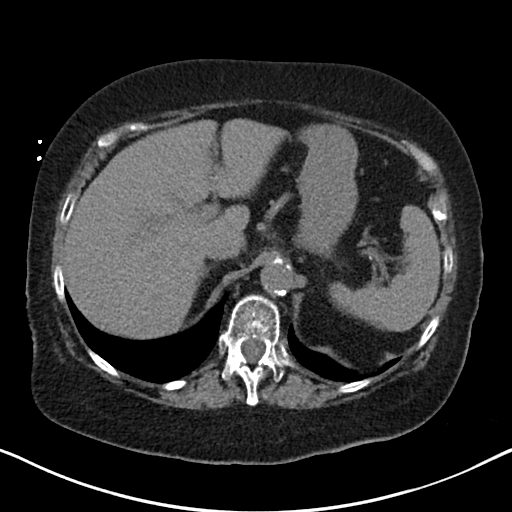
[im 78/90  soft-tissue]
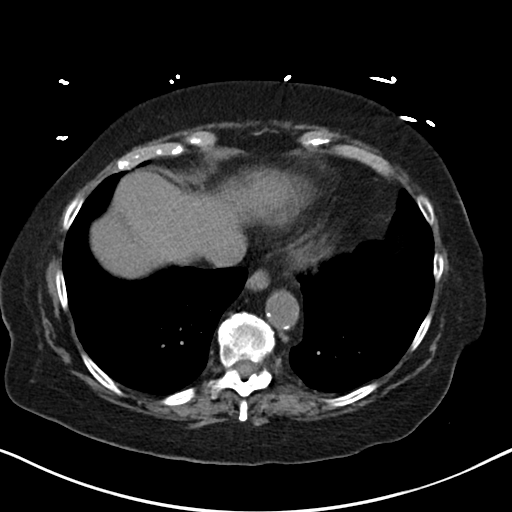
[im 86/90  soft-tissue]
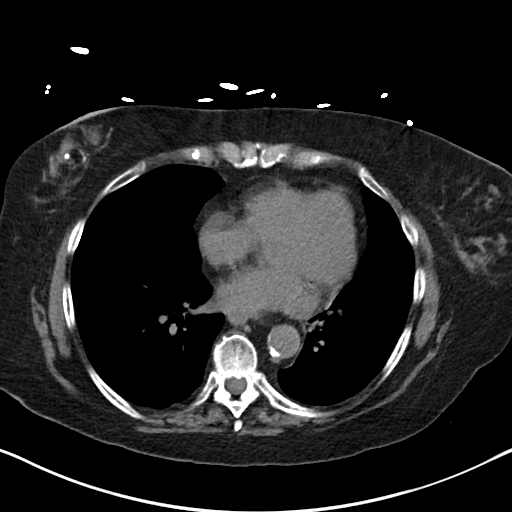

[Series 6: cor st · coronal · 0.66mm/px · 3 of 100 slices shown]
[im 34/100  soft-tissue]
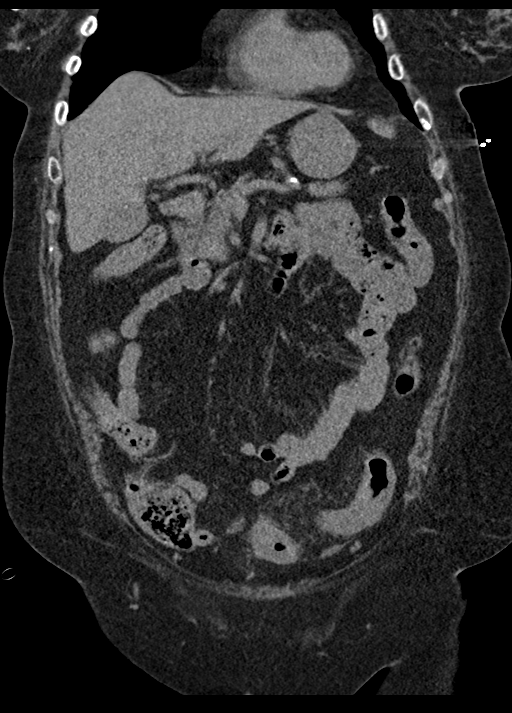
[im 45/100  soft-tissue]
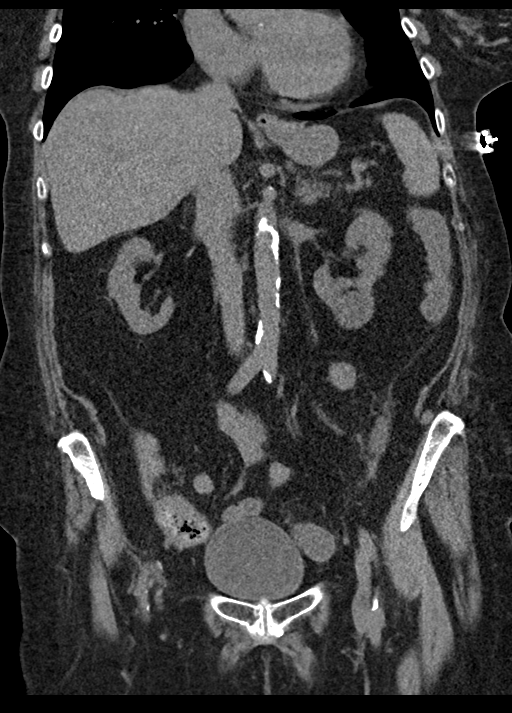
[im 56/100  soft-tissue]
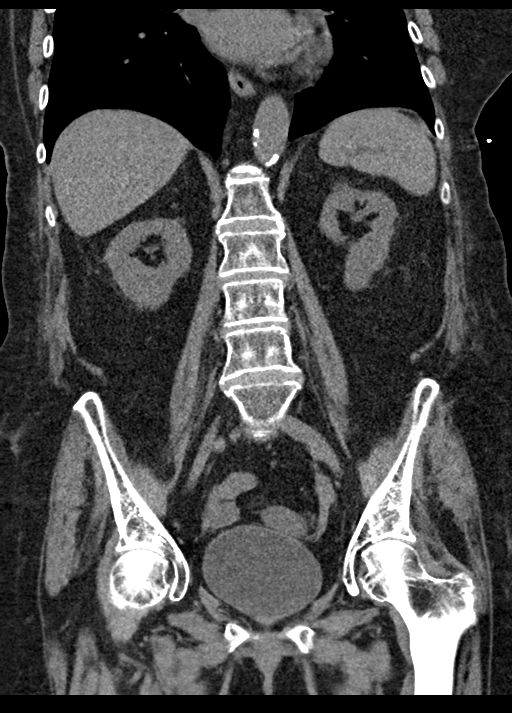

[15 of 46 positions shown; findings below may reference images not displayed]

FINDINGS: Lower chest: Pulmonary micronodule within the lingula. Bibasilar
subsegmental atelectasis. Coronary artery calcifications. No
significant pericardial effusion.

Hepatobiliary: Similar appearing 4.4 cm fluid density lesion within
the left hepatic lobe likely represents a simple hepatic cyst. No
new focal liver abnormality is seen. Status post cholecystectomy. No
biliary dilatation.

Pancreas: Unremarkable. No pancreatic ductal dilatation or
surrounding inflammatory changes.

Spleen: Normal in size without focal abnormality.

Adrenals/Urinary Tract: Similar-appearing 9 mm hypodense nodule
within left adrenal gland consistent with adrenal adenoma. Otherwise
adrenals are unremarkable. Bilateral renal cortical scarring. No
renal calculi, focal lesion, or hydronephrosis. No hydroureter or
ureterolithiasis. The urinary bladder is unremarkable.

Stomach/Bowel: Stomach is within normal limits. The appendix is not
definitely identified. No evidence of bowel wall thickening,
distention, or inflammatory changes. The colon is under distended.
There pericolonic fat stranding surrounding the sigmoid colon. No
diverticula identified. The remainder of the colon is unremarkable.
No pneumatosis.

Vascular/Lymphatic: No abdominal aorta or iliac aneurysm. Moderate
atherosclerotic plaque of the aorta and its branches. No abdominal,
pelvic, or inguinal lymphadenopathy.

Reproductive: Status post hysterectomy. No adnexal masses.

Other: No intraperitoneal free fluid. No intraperitoneal free gas.
No organized fluid collection.

Musculoskeletal:

No abdominal wall hernia or abnormality. Nonspecific coarse
calcifications of the visualized breasts.

No suspicious lytic or blastic osseous lesions. No acute displaced
fracture. Multilevel degenerative changes of the spine.
IMPRESSION: 1. Mild inflammatory changes of the sigmoid colon with no
diverticula to suggest diverticulitis. Findings likely represent
colitis. Etiology may be infectious, inflammatory, ischemic.
2.  Aortic Atherosclerosis ([id number]).

## 2022-02-16 IMAGING — DX DG CHEST 1V PORT
1 series · 1 of 1 positions shown · non-contrast
Comparison: CT 02/19/2019, radiograph 03/23/2016

CLINICAL DATA: Possible COVID

EXAM:
PORTABLE CHEST 1 VIEW

[chest ap]
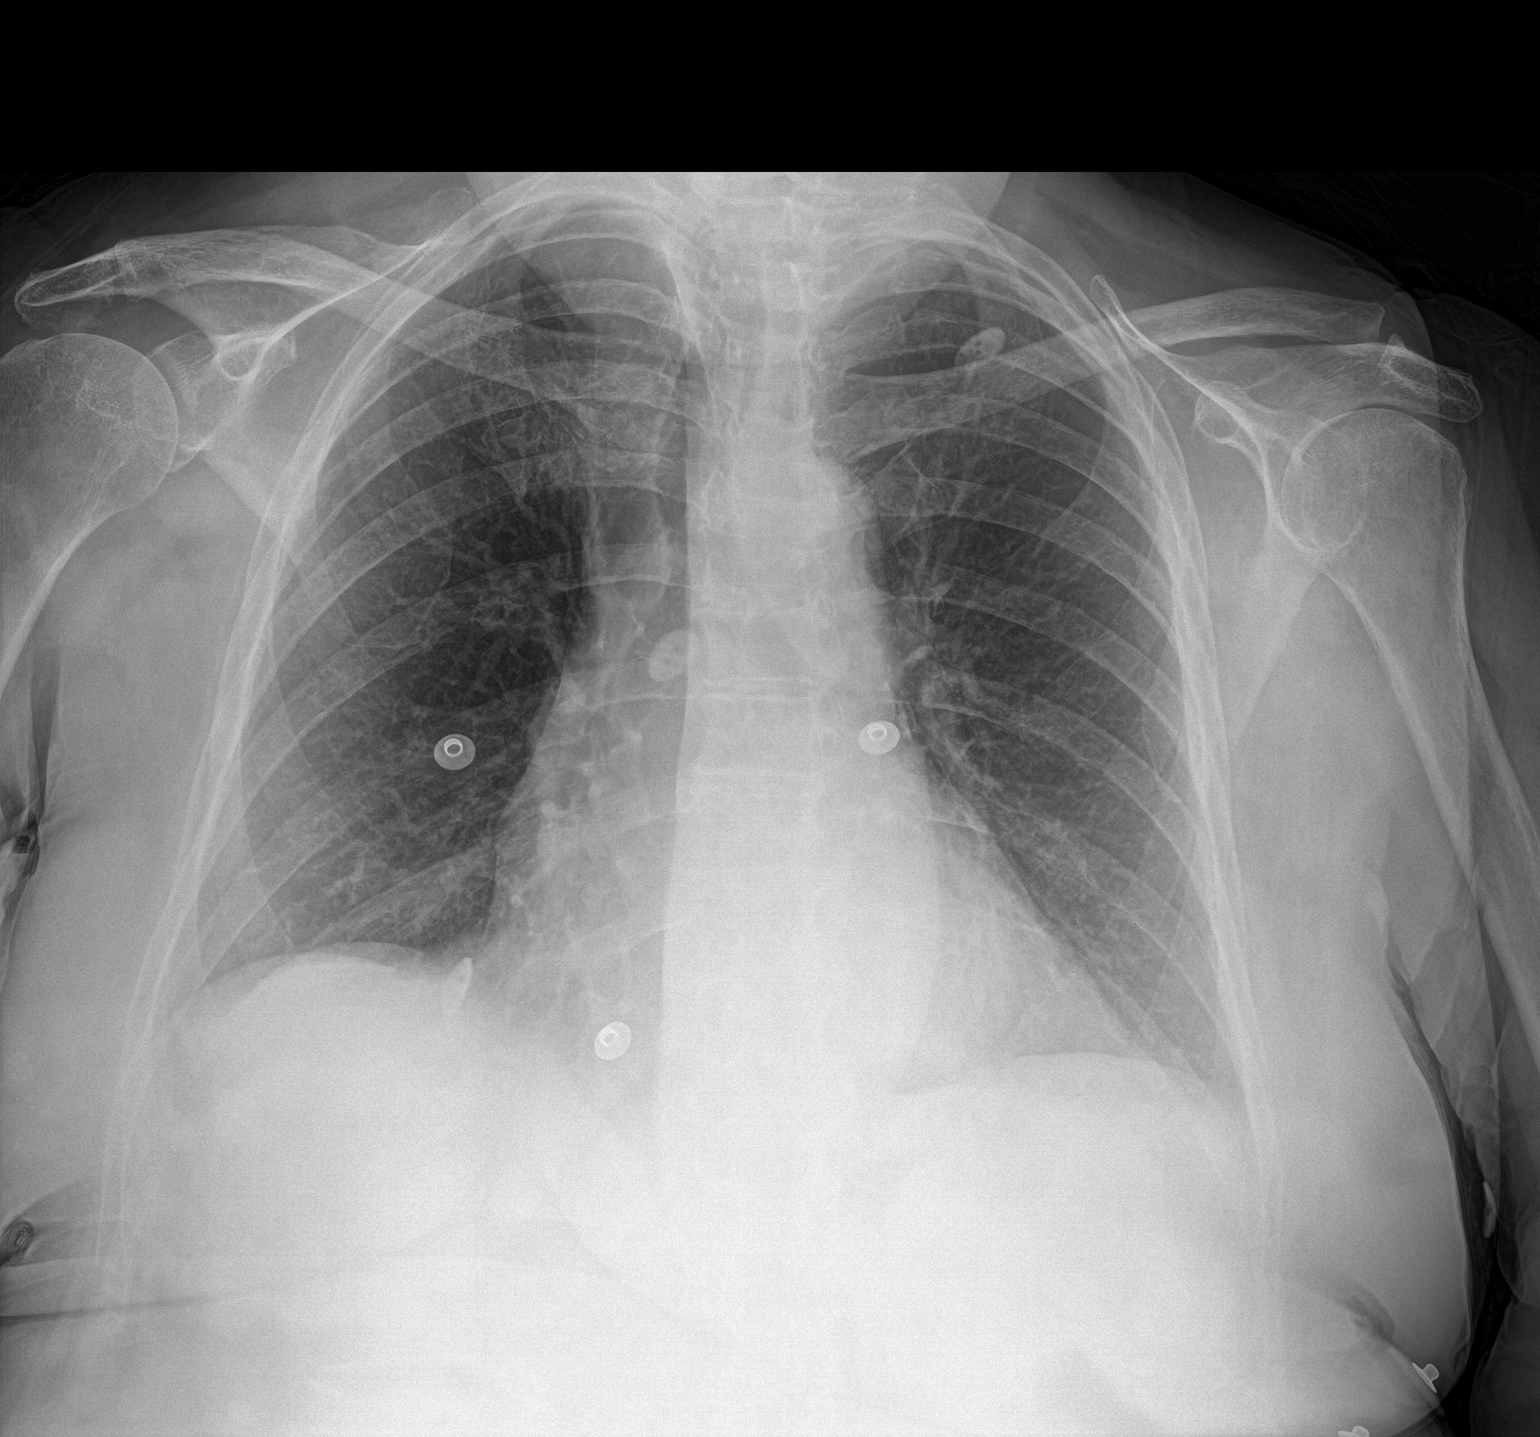

[1 of 1 positions shown; findings below may reference images not displayed]

FINDINGS: Coarsened interstitial changes and bronchitic features are similar
to comparison radiography. No consolidation, features of edema,
pneumothorax, or effusion. The aorta is calcified. The remaining
cardiomediastinal contours are unremarkable. No acute osseous or
soft tissue abnormality. Degenerative changes are present in the
imaged spine and shoulders. Chronic elevation of the distal left
clavicle relative to the acromion may reflect remote AC joint
injury.
IMPRESSION: 1. No acute cardiopulmonary findings.
2. Chronic interstitial and bronchitic features.

## 2022-02-18 ENCOUNTER — Ambulatory Visit: Payer: Medicare Other | Admitting: Podiatry

## 2022-02-21 ENCOUNTER — Other Ambulatory Visit: Payer: Self-pay | Admitting: *Deleted

## 2022-02-21 ENCOUNTER — Encounter: Payer: Self-pay | Admitting: Podiatry

## 2022-02-21 ENCOUNTER — Ambulatory Visit (INDEPENDENT_AMBULATORY_CARE_PROVIDER_SITE_OTHER): Payer: Medicare Other | Admitting: Podiatry

## 2022-02-21 ENCOUNTER — Other Ambulatory Visit: Payer: Self-pay

## 2022-02-21 DIAGNOSIS — E119 Type 2 diabetes mellitus without complications: Secondary | ICD-10-CM | POA: Diagnosis not present

## 2022-02-21 DIAGNOSIS — M79674 Pain in right toe(s): Secondary | ICD-10-CM | POA: Diagnosis not present

## 2022-02-21 DIAGNOSIS — B351 Tinea unguium: Secondary | ICD-10-CM | POA: Diagnosis not present

## 2022-02-21 DIAGNOSIS — M2142 Flat foot [pes planus] (acquired), left foot: Secondary | ICD-10-CM | POA: Diagnosis not present

## 2022-02-21 DIAGNOSIS — M79675 Pain in left toe(s): Secondary | ICD-10-CM | POA: Diagnosis not present

## 2022-02-21 DIAGNOSIS — I739 Peripheral vascular disease, unspecified: Secondary | ICD-10-CM | POA: Diagnosis not present

## 2022-02-21 DIAGNOSIS — E1140 Type 2 diabetes mellitus with diabetic neuropathy, unspecified: Secondary | ICD-10-CM | POA: Diagnosis not present

## 2022-02-21 DIAGNOSIS — M2141 Flat foot [pes planus] (acquired), right foot: Secondary | ICD-10-CM | POA: Diagnosis not present

## 2022-02-28 NOTE — Progress Notes (Signed)
ANNUAL DIABETIC FOOT EXAM ? ?Subjective: ?Anna Stone presents today for annual diabetic foot examination. ? ?Patient relates >10 year h/o diabetes. ? ?Patient denies any h/o foot wounds. ? ?Patient has been diagnosed with neuropathy and it is managed with gabapentin. ? ?Patient's blood sugar was 100-110 mg/dl today.  ? ?Patient did not check blood glucose this morning. ? ?Patient does not monitor blood glucose daily. ? ?Risk factors: diabetic neuropathy, HTN, CKD, hyperlipidemia. ? ?Marco Collie, MD is patient's PCP. Last visit was December 03, 2021. ? ?Past Medical History:  ?Diagnosis Date  ? Cancer [facility])   ? Chronic kidney disease   ? stage 3  ? Depression   ? Diabetes mellitus without complication ([place])   ? Difficulty breathing   ? GERD (gastroesophageal reflux disease)   ? Hypertension   ? IBS (irritable bowel syndrome)   ? Muscle pain   ? Palpitation   ? PONV (postoperative nausea and vomiting)   ? Swelling   ? ?Patient Active Problem List  ? Diagnosis Date Noted  ? Diarrhea of presumed infectious origin   ? Enteritis, Yersinia enterocolitica   ? Acute kidney injury superimposed on CKD ([place]) 09/17/2020  ? 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 Generalized weakness 09/17/2020  ? Hyperlipidemia associated with type 2 diabetes mellitus ([place]) 09/17/2020  ? Type 2 diabetes mellitus with stage 3 chronic kidney disease ([place]) 08/31/2020  ? Hypertension associated with diabetes ([place]) 08/31/2020  ? Chronic pain of both knees 05/14/2018  ? Synovial cyst 10/30/2016  ? Chronic radicular lumbar pain 08/24/2016  ? ?Past Surgical History:  ?Procedure Laterality Date  ? ABDOMINAL HYSTERECTOMY    ? ANKLE FRACTURE SURGERY Right   ? BREAST SURGERY    ? right  ? CHOLECYSTECTOMY    ? COLONOSCOPY    ? Dr Odie Sera in [place], > 10 yrs prior to 2021.    ? DECOMPRESSIVE LUMBAR LAMINECTOMY LEVEL 1 Right 10/30/2016  ? Procedure: RIGHT L5-S1 DECOMPRESSION, EXCISION OF CYST LEVEL 1;  Surgeon: Melina Schools, MD;   Location: [place];  Service: Orthopedics;  Laterality: Right;  ? OVARY SURGERY    ? ?Current Outpatient Medications on File Prior to Visit  ?Medication Sig Dispense Refill  ? doxazosin (CARDURA) 1 MG tablet Take 1 mg by mouth at bedtime.    ? feeding supplement, ENSURE ENLIVE, (ENSURE ENLIVE) LIQD Take 237 mLs by mouth 2 (two) times daily between meals. 237 mL 12  ? fluticasone (FLONASE) 50 MCG/ACT nasal spray Place 2 sprays into both nostrils daily as needed for allergies.     ? furosemide (LASIX) 20 MG tablet Take 20 mg by mouth daily.    ? gabapentin (NEURONTIN) 600 MG tablet Take 600 mg by mouth 2 (two) times daily.     ? losartan (COZAAR) 50 MG tablet Take by mouth.    ? metoprolol tartrate (LOPRESSOR) 25 MG tablet Take 1 tablet (25 mg total) by mouth 2 (two) times daily. 60 tablet 0  ? mirtazapine (REMERON) 15 MG tablet Take by mouth.    ? nystatin (MYCOSTATIN) 100000 UNIT/ML suspension Take 5 mLs (500,000 Units total) by mouth 4 (four) times daily. 60 mL 0  ? omeprazole (PRILOSEC) 20 MG capsule Take 40 mg by mouth daily.     ? ondansetron (ZOFRAN) 4 MG tablet Take 1 tablet (4 mg total) by mouth every 6 (six) hours as needed for nausea. 20 tablet 0  ? pantoprazole (PROTONIX) 40 MG tablet TAKE 1 TABLET BY MOUTH ONCE  DAILY, DISCONTINUE OMEPRAZOLE    ? rivaroxaban (XARELTO) 20 MG TABS tablet Take 1 tablet (20 mg total) by mouth daily with supper. 30 tablet 3  ? rosuvastatin (CRESTOR) 5 MG tablet Take 5 mg by mouth at bedtime.     ? ?No current facility-administered medications on file prior to visit.  ?  ?Allergies  ?Allergen Reactions  ? Sulfa Antibiotics Itching and Rash  ? Adhesive [Tape] Dermatitis  ?  Blisters the skin  ? ?Social History  ? ?Occupational History  ? Not on file  ?Tobacco Use  ? Smoking status: Never  ? Smokeless tobacco: Never  ?Substance and Sexual Activity  ? Alcohol use: No  ? Drug use: No  ? Sexual activity: Not on file  ? ?History reviewed. No pertinent family history. ?Immunization  History  ?Administered Date(s) Administered  ? Influenza-Unspecified 08/16/2014, 12/16/2019  ? Moderna Sars-Covid-2 Vaccination 02/14/2020, 03/15/2020  ?  ? ?Review of Systems: Negative except as noted in the HPI.  ? ?Objective: ?There were no vitals filed for this visit. ? ?Anna Stone is a pleasant 81 y.o. female in NAD. AAO X 3. ? ?Vascular Examination: ?CFT <3 seconds b/l LE. Palpable DP pulse(s) b/l LE. Diminished PT pulse(s) b/l LE. Pedal hair absent. No pain with calf compression b/l. Lower extremity skin temperature gradient within normal limits. Trace edema noted BLE. ? ?Dermatological Examination: ?Pedal integument with normal turgor, texture and tone BLE. No open wounds b/l LE. No interdigital macerations noted b/l LE. Toenails 1-5 b/l elongated, discolored, dystrophic, thickened, crumbly with subungual debris and tenderness to dorsal palpation. No hyperkeratotic nor porokeratotic lesions present on today's visit. Well healed surgical scar medial aspect of right ankle. ? ?Neurological Examination: ?Protective sensation intact 5/5 intact bilaterally with 10g monofilament b/l. Vibratory sensation intact b/l. ? ?Musculoskeletal Examination: ?Muscle strength 5/5 to all lower extremity muscle groups bilaterally. Pes planus deformity noted bilateral LE. Utilizes cane for ambulation assistance. ? ?Footwear Assessment: ?Does the patient wear appropriate shoes? Yes. ?Does the patient need inserts/orthotics? Yes. ? ?Assessment: ?1. Pain due to onychomycosis of toenails of both feet   ?2. Pes planus of both feet   ?3. Type 2 diabetes mellitus with diabetic neuropathy, unspecified whether long term insulin use ([place])   ?4. PVD (peripheral vascular disease) ([place])   ?5. Encounter for diabetic foot exam ([place])   ?  ?ADA Risk Categorization: ?High Risk  ?Patient has one or more of the following: ?Loss of protective sensation ?Absent pedal pulses ?Severe Foot deformity ?History of foot  ulcer ? ?Plan: ?-Consent given for treatment as described below: ?-Diabetic foot examination performed today. ?-Continue foot and shoe inspections daily. Monitor blood glucose per PCP/Endocrinologist's recommendations. ?-Toenails 1-5 b/l were debrided in length and girth with sterile nail nippers and dremel without iatrogenic bleeding.  ?-Patient/POA to call should there be question/concern in the interim. ?Return in about 3 months (around 05/24/2022). ? ?Marzetta Board, DPM ?

## 2022-03-10 IMAGING — CT CT ANGIO CHEST
2 of 6 series · 18 of 36 positions shown · IV contrast (omnipaque)
Comparison: 02/19/2019.

CLINICAL DATA: Evaluate for pulmonary embolus. Chest pain and
weakness.

EXAM:
CT ANGIOGRAPHY CHEST WITH CONTRAST
TECHNIQUE: Multidetector CT imaging of the chest was performed using the
standard protocol during bolus administration of intravenous
contrast. Multiplanar CT image reconstructions and MIPs were
obtained to evaluate the vascular anatomy.
CONTRAST:  53mL OMNIPAQUE IOHEXOL 350 MG/ML SOLN

[Series 7: pe thins · axial · 0.60mm/px · z∈[+1100,+1339]mm · 17 of 381 slices shown]
[im 20/381  lung]
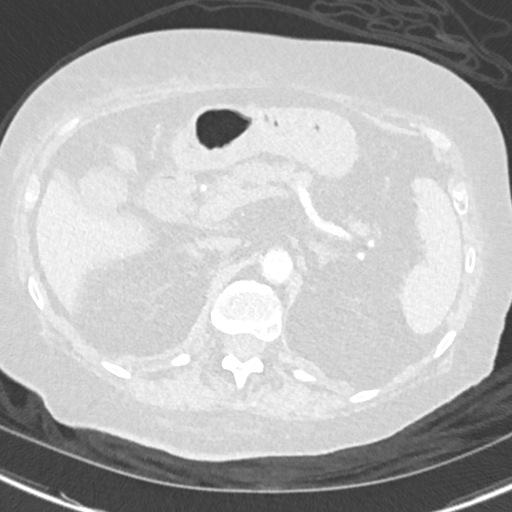
[im 39/381  mediastinal]
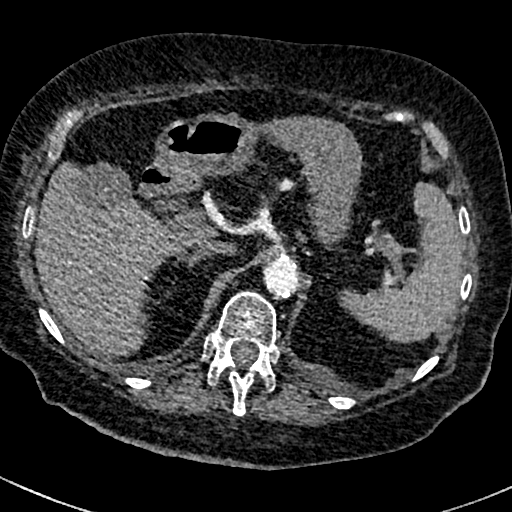
[im 58/381  lung]
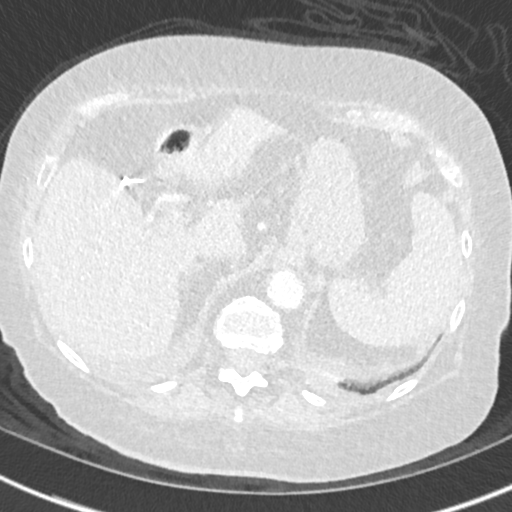
[im 77/381  mediastinal]
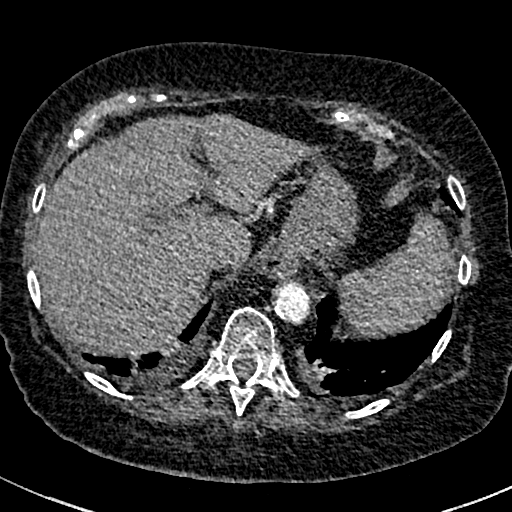
[im 115/381  lung]
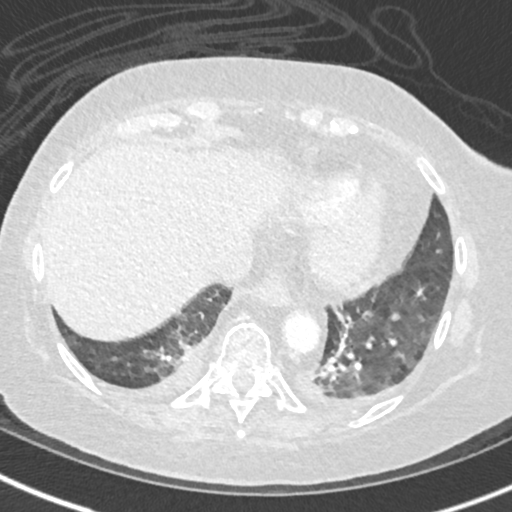
[im 134/381  mediastinal]
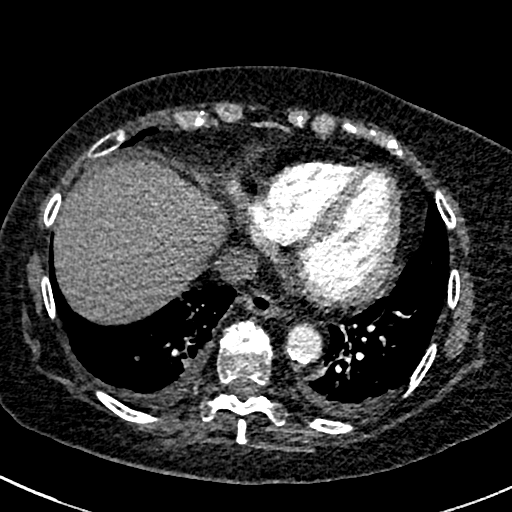
[im 153/381  lung]
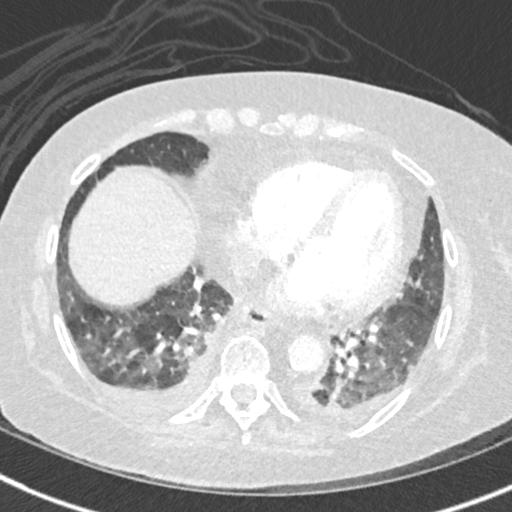
[im 172/381  mediastinal]
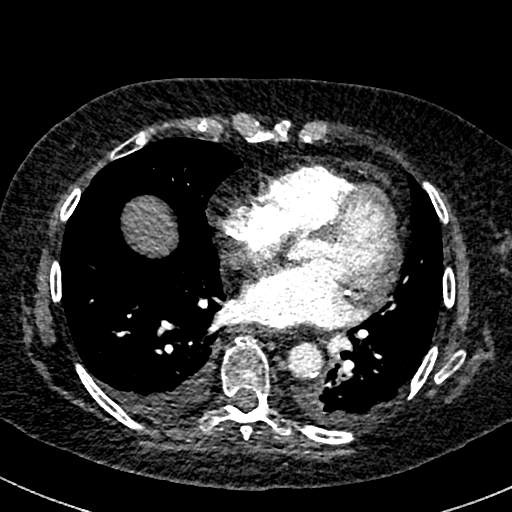
[im 191/381  lung]
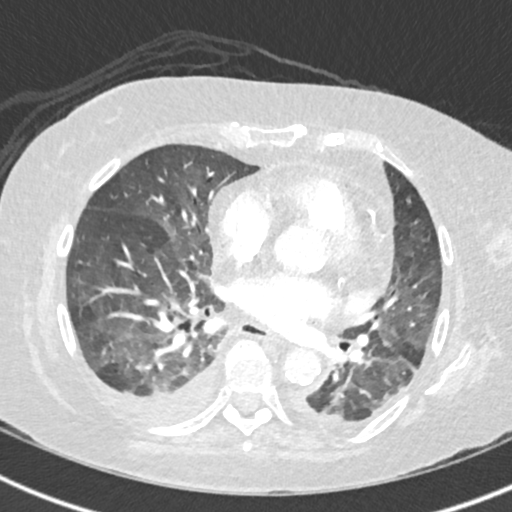
[im 210/381  mediastinal]
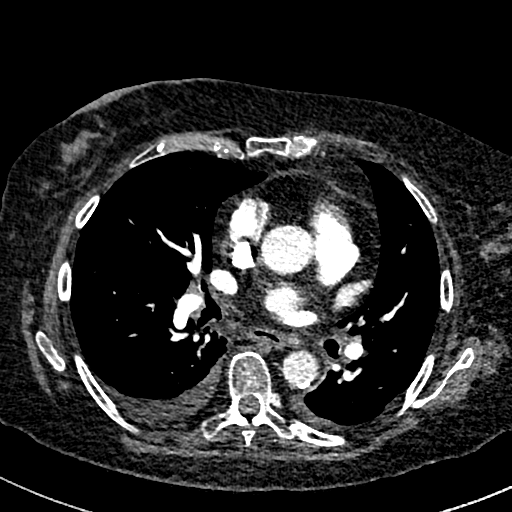
[im 229/381  lung]
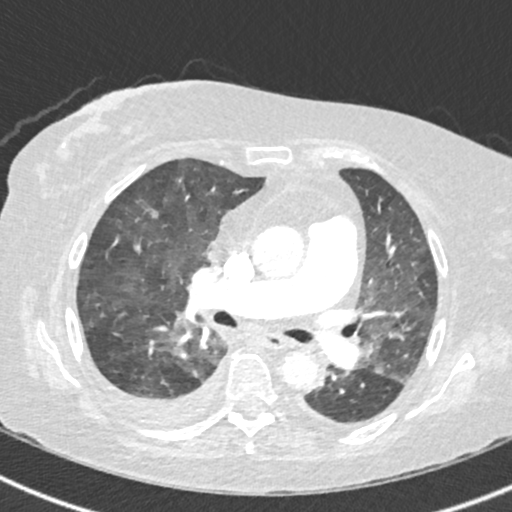
[im 248/381  mediastinal]
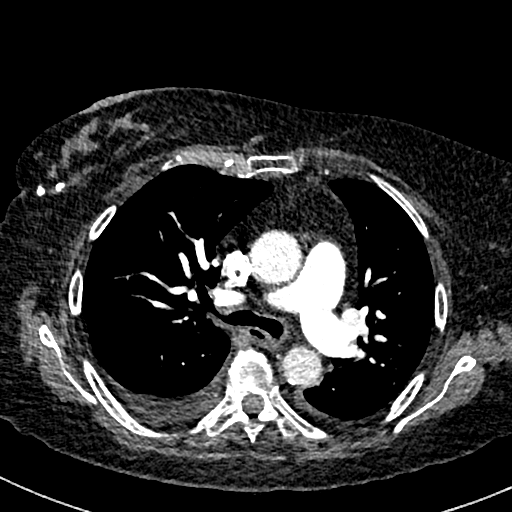
[im 267/381  lung]
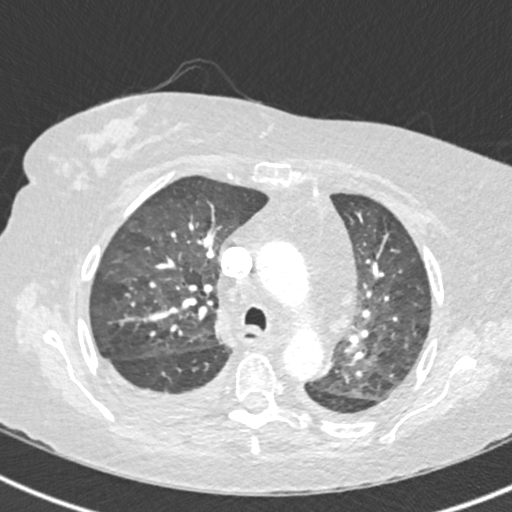
[im 305/381  mediastinal]
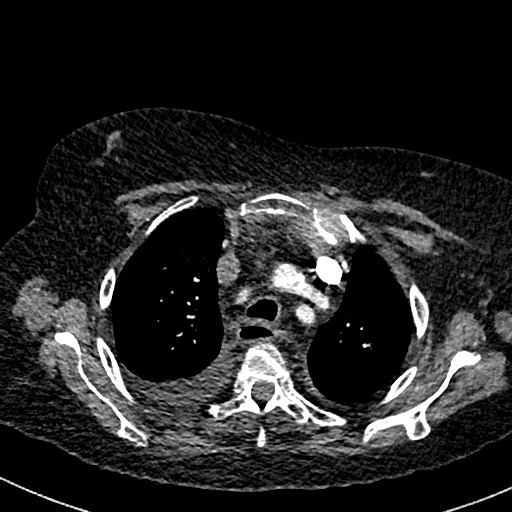
[im 324/381  lung]
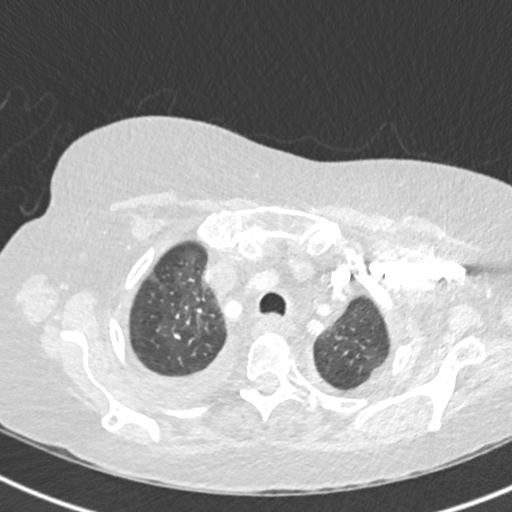
[im 343/381  mediastinal]
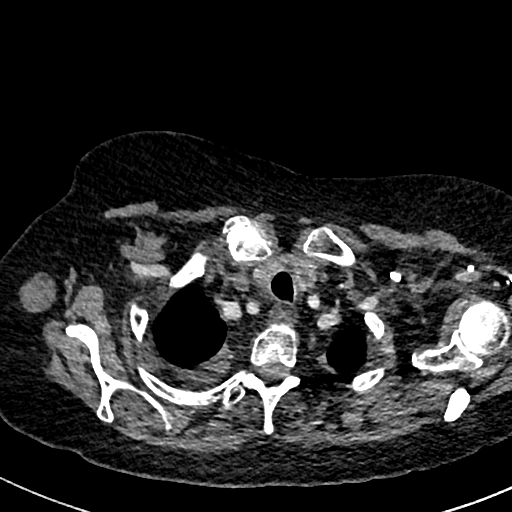
[im 362/381  lung]
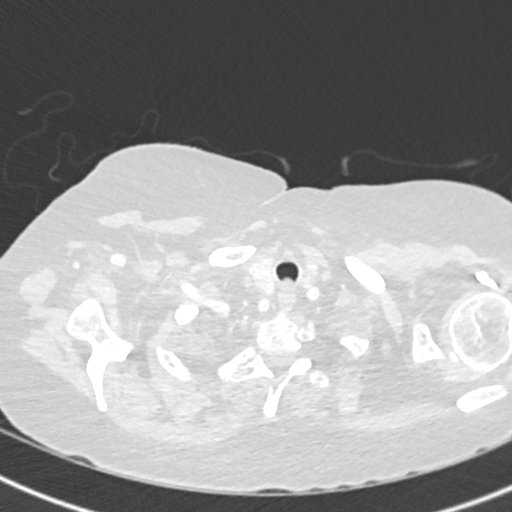

[Series 8: pe 2mm cor · coronal · 0.59mm/px · 1 of 118 slices shown]
[im 59/118  mediastinal]
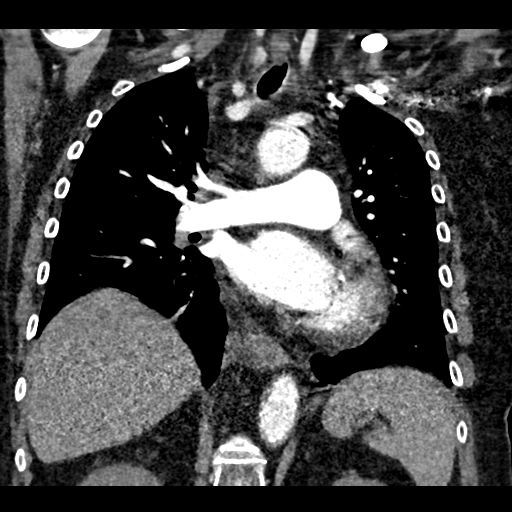

[18 of 36 positions shown; findings below may reference images not displayed]

FINDINGS: Cardiovascular: The main pulmonary artery appears patent. No central
obstructing embolus identified bilaterally. There are no signs of
lobar or segmental pulmonary artery embolus. Heart size within
normal limits. No pericardial effusion. Aortic atherosclerosis and
coronary artery atherosclerotic calcifications identified.

Mediastinum/Nodes: No enlarged mediastinal, hilar, or axillary lymph
nodes. Thyroid gland, trachea, and esophagus demonstrate no
significant findings.

Lungs/Pleura: There are small bilateral pleural effusions.
Subsegmental atelectasis noted within the medial lung bases.
Bilateral, multifocal areas of ground-glass attenuation and air
trapping identified. No airspace consolidation identified. Within
the anteromedial right lower lobe there is a perifissural nodule
measuring 5 mm, image 70/6. Unchanged from previous exam.

Upper Abdomen: No acute abnormality noted within the imaged portions
of the upper abdomen. Cyst noted within lateral segment of left
hepatic lobe. Small to moderate hiatal hernia. Signs of previous
cholecystectomy. Partially exophytic structure arising from segment
5 measures 3.2 cm and 24 Hounsfield units. This is unchanged when
compared with 02/19/2019.

Musculoskeletal: No acute osseous findings.

Review of the MIP images confirms the above findings.
IMPRESSION: 1. No evidence for acute pulmonary embolus.
2. Small bilateral pleural effusions.
3. Bilateral, multifocal areas of ground-glass attenuation and air
trapping compatible with small airways disease.
4. 5 mm perifissural nodule in the right lower lobe is unchanged
from 02/19/2019 and likely represents a benign abnormality.
5. Coronary artery atherosclerotic calcifications.
6. Hiatal hernia.

Aortic Atherosclerosis ([id number]).

## 2022-03-16 DIAGNOSIS — D692 Other nonthrombocytopenic purpura: Secondary | ICD-10-CM | POA: Diagnosis not present

## 2022-03-16 DIAGNOSIS — E1122 Type 2 diabetes mellitus with diabetic chronic kidney disease: Secondary | ICD-10-CM | POA: Diagnosis not present

## 2022-03-19 ENCOUNTER — Other Ambulatory Visit: Payer: Self-pay | Admitting: *Deleted

## 2022-03-19 NOTE — Patient Outreach (Signed)
[place] [facility]) Care Management ? ?03/19/2022 ? ?Rickey Primus Sheffield Klaus ?1941-03-18 ?[id number] ? ? ?Received referral for Care Management from Insurance plan. Assigned patient to Valente David, RN Care Coordinator for follow up. ? ?Philmore Pali ?[place] Management Assistant ?[phone] ? ?

## 2022-03-19 NOTE — Patient Outreach (Signed)
[place] [facility]) Care Management ? ?03/19/2022 ? ?Rickey Primus Sheffield Wray ?17-Jan-1941 ?[id number] ? ? ?Referral received for high risk involvement for ACO Reach.  Noted that member is already active with Health coach, assigned to F. Pleasant.   ? ?Valente David, RN, MSN, CCM ?[facility] Care Management  ?Community Care Manager ?[phone] ? ?

## 2022-03-21 ENCOUNTER — Other Ambulatory Visit: Payer: Self-pay | Admitting: *Deleted

## 2022-03-21 NOTE — Patient Outreach (Signed)
[place] [facility]) Care Management ?RN Health Coach Note ? ? ?03/21/2022 ?Name:  Anna Stone MRN:  [id number] DOB:  31-Mar-1941 ? ?Summary: ?Her Fasting Blood sugar is 110. Her A1C is 5.9. Per patient her appetite is good.  ? ?Recommendations/Changes made from today's visit: ?Keep up the good work ?Medication adherence ? ? ? ?Subjective: ?Anna Stone is an 81 y.o. year old female who is a primary patient of Marco Collie, MD. The care management team was consulted for assistance with care management and/or care coordination needs.   ? ?RN Health Coach completed Telephone Visit today.  ? ?Objective: ? ?Medications Reviewed Today   ? ? Reviewed by Marzetta Board, DPM (Physician) on 02/21/22 at 1355  Med List Status: <None>  ? ?Medication Order Taking? Sig Documenting Provider Last Dose Status Informant  ?amLODipine (NORVASC) 5 MG tablet [id number]  Take 1 tablet by mouth daily. [provider]  Active   ?aspirin 81 MG EC tablet [id number]  Take 1 tablet by mouth daily. [provider]  Active   ?bupivacaine, PF, (MARCAINE) 0.25 % SOLN injection [id number]  Inject into the skin. [provider]  Active   ?doxazosin (CARDURA) 1 MG tablet [id number] No Take 1 mg by mouth at bedtime. [provider] Taking Active Self  ?feeding supplement, ENSURE ENLIVE, (ENSURE ENLIVE) LIQD [id number] No Take 237 mLs by mouth 2 (two) times daily between meals. Regalado, Belkys A, MD Taking Active   ?fluticasone (FLONASE) 50 MCG/ACT nasal spray [id number] No Place 2 sprays into both nostrils daily as needed for allergies.  [provider] Taking Active Self  ?furosemide (LASIX) 20 MG tablet [id number]  Take 20 mg by mouth daily. [provider]  Active Self  ?gabapentin (NEURONTIN) 600 MG tablet [id number] No Take 600 mg by mouth 2 (two) times daily.  [provider] Taking Active Self  ?hydrochlorothiazide (HYDRODIURIL) 25 MG tablet  [id number]  Take 1 tablet by mouth daily. [provider]  Active   ?JARDIANCE 25 MG TABS tablet [id number]  Take by mouth. [provider]  Active   ?losartan (COZAAR) 50 MG tablet [id number]  Take by mouth. [provider]  Active   ?metoprolol succinate (TOPROL-XL) 100 MG 24 hr tablet [id number]  Take 1 tablet by mouth daily. [provider]  Active   ?metoprolol tartrate (LOPRESSOR) 25 MG tablet [id number] No Take 1 tablet (25 mg total) by mouth 2 (two) times daily. Regalado, Belkys A, MD Taking Active   ?mirtazapine (REMERON) 15 MG tablet [id number]  Take by mouth. [provider]  Active   ?nystatin (MYCOSTATIN) 100000 UNIT/ML suspension [id number] No Take 5 mLs (500,000 Units total) by mouth 4 (four) times daily. Regalado, Belkys A, MD Taking Active   ?omeprazole (PRILOSEC) 20 MG capsule [id number] No Take 40 mg by mouth daily.  [provider] Taking Active Self  ?ondansetron (ZOFRAN) 4 MG tablet [id number] No Take 1 tablet (4 mg total) by mouth every 6 (six) hours as needed for nausea. Regalado, Belkys A, MD Taking Active   ?pantoprazole (PROTONIX) 40 MG tablet [id number]  TAKE 1 TABLET BY MOUTH ONCE DAILY, DISCONTINUE OMEPRAZOLE [provider]  Active   ?pioglitazone (ACTOS) 15 MG tablet [id number]  Take 1 tablet by mouth daily. [provider]  Active   ?rivaroxaban (XARELTO) 20 MG TABS tablet [id number] No Take 1 tablet (20 mg total) by mouth daily with supper. Regalado, Cassie Freer, MD Taking Active   ?rOPINIRole (REQUIP)  2 MG tablet [id number]  Take by mouth. [provider]  Active   ?rosuvastatin (CRESTOR) 5 MG tablet [id number] No Take 5 mg by mouth at bedtime.  [provider] Taking Active Self  ?triamcinolone acetonide (TRIESENCE) 40 MG/ML SUSP [id number]  Inject into the articular space. [provider]  Active   ? ?  ?  ? ?  ? ? ? ?SDOH:  (Social Determinants of Health) assessments and interventions performed:   ? ? ?Care Plan ? ?Review of patient past medical history, allergies, medications, health status, including review of consultants reports, laboratory and other test data, was performed as part of comprehensive evaluation for care management services.  ? ?Care Plan : Diabetes Type 2 (Adult)  ?Updates made by Mitchael Luckey, Eppie Gibson, RN since 03/21/2022 12:00 AM  ?  ? ?Problem: Glycemic Management (Diabetes, Type 2) Resolved 03/21/2022  ?Priority: High  ?Onset Date: 12/18/2020  ?Note:   ?[id number] Resolving due to duplicate goal ? ?  ? ?Long-Range Goal: Glycemic Management Optimized Completed 03/21/2022  ?Start Date: 12/18/2020  ?Expected End Date: 12/14/2022  ?Recent Progress: On track  ?Priority: Medium  ?Note:   ?Evidence-based guidance:  ?Anticipate A1C testing (point-of-care) every 3 to 6 months based on goal attainment.  ?Review mutually-set A1C goal or target range.  ?Anticipate use of antihyperglycemic with or without insulin and periodic adjustments; consider active involvement of pharmacist.  ?Provide medical nutrition therapy and development of individualized eating.  ?Compare self-reported symptoms of hypo or hyperglycemia to blood glucose levels, diet and fluid intake, current medications, psychosocial and physiologic stressors, change in activity and barriers to care adherence.  ?Promote self-monitoring of blood glucose levels.  ?Assess and address barriers to management plan, such as food insecurity, age, developmental ability, depression, anxiety, fear of hypoglycemia or weight gain, as well as medication cost, side effects and complicated regimen.  ?Consider referral to community-based diabetes education program, visiting nurse, community health worker or health coach.  ?Encourage regular dental care for treatment of periodontal disease; refer to dental provider when needed.   ?Notes:  ?[id number] Patient is following through with all health maintenance POD, eye exam  ?  ? ?Task: Alleviate Barriers to Glycemic  Management Completed 03/21/2022  ?Due Date: 12/14/2022  ?Note:   ?Care Management Activities:  ?  ?- barriers to adherence to treatment plan identified ?- blood glucose monitoring encouraged ?- resources required to improve adherence to care identified ?- self-awareness of signs/symptoms of hypo or hyperglycemia encouraged ?- use of blood glucose monitoring log promoted  ?  ?Notes:  ?RN sent a calendar book for documentation ?  ? ?Problem: Disease Progression (Diabetes, Type 2) Resolved 03/21/2022  ?Priority: Medium  ?Onset Date: 12/18/2020  ?Note:   ?[id number] Resolving due to duplicate goal ? ?  ? ?Long-Range Goal: Disease Progression Prevented or Minimized Completed 03/21/2022  ?Start Date: 12/18/2020  ?Expected End Date: 12/14/2022  ?Recent Progress: On track  ?Priority: Medium  ?Note:   ?Evidence-based guidance:  ?Prepare patient for laboratory and diagnostic exams based on risk and presentation.  ?Encourage lifestyle changes, such as increased intake of plant-based foods, stress reduction, consistent physical activity and smoking cessation to prevent long-term complications and chronic disease.   ?Individualize activity and exercise recommendations while considering potential limitations, such as neuropathy, retinopathy or the ability to prevent hyperglycemia or hypoglycemia.   ?Prepare patient for use of pharmacologic therapy that may include antihypertensive, analgesic, prostaglandin E1 with periodic adjustments, based on presenting chronic condition and laboratory results.  ?Assess  signs/symptoms and risk factors for hypertension, sleep-disordered breathing, neuropathy (including changes in gait and balance), retinopathy, nephropathy and sexual dysfunction.  ?Address pregnancy planning and contraceptive choice, especially when prescribing antihypertensive or statin.  ?Ensure completion of annual comprehensive foot exam and dilated eye exam.   ?Implement additional individualized goals and interventions based on  identified risk factors.  ?Prepare patient for consultation or referral for specialist care, such as ophthalmology, neurology, cardiology, podiatry, nephrology or perinatology.   ?Notes:  ?[id number] Patient is following up

## 2022-03-21 NOTE — Patient Instructions (Signed)
Visit Information ? ?Thank you for taking time to visit with me today. Please don't hesitate to contact me if I can be of assistance to you before our next scheduled telephone appointment. ? ?Following are the goals we discussed today:  ?Current Barriers:  ?Knowledge Deficits related to plan of care for management of DMII  ? ?RNCM Clinical Goal(s):  ?Patient will verbalize understanding of plan for management of DMII as evidenced by A1C less than 5.8 ?take all medications exactly as prescribed and will call provider for medication related questions as evidenced by continuation of monitoring blood sugars and adhering to diabetic diet through collaboration with RN Care manager, provider, and care team.  ? ?Interventions: ?Inter-disciplinary care team collaboration (see longitudinal plan of care) ?Evaluation of current treatment plan related to  self management and patient's adherence to plan as established by provider ? ? ?Diabetes Interventions:  (Status:  Goal on track:  Yes.) Long Term Goal ?Assessed patient's understanding of A1c goal: <6.5% ?Provided education to patient about basic DM disease process ?Counseled on importance of regular laboratory monitoring as prescribed ?Discussed plans with patient for ongoing care management follow up and provided patient with direct contact information for care management team ?Lab Results  ?Component Value Date  ? HGBA1C 7.2 (H) 09/01/2020  ? ?Last HGB A1C 5.9 on 11/29/2021 ? ?Patient Goals/Self-Care Activities: ?Take all medications as prescribed ?Attend all scheduled provider appointments ?Call pharmacy for medication refills 3-7 days in advance of running out of medications ?Perform all self care activities independently  ?Call provider office for new concerns or questions  ?call the Suicide and Crisis Lifeline: 988 if experiencing a Mental Health or [place]  ?schedule appointment with eye doctor ?check blood sugar at prescribed times: when you have  symptoms of low or high blood sugar and use the hypo or hyperglycemic protocol ?check feet daily for cuts, sores or redness ?take the blood sugar log to all doctor visits ?trim toenails straight across ?drink 6 to 8 glasses of water each day ?set a realistic goal ?wash and dry feet carefully every day ?wear comfortable, cotton socks ?wear comfortable, well-fitting shoes ? ?Follow Up Plan:  Telephone follow up appointment with care management team member scheduled for:  July 2023 ?The patient has been provided with contact information for the care management team and has been advised to call with any health related questions or concerns.   ? ?[id number] Patient fasting blood sugar was 110. Her appetite is good. Her last A1C was 5.9. Per patient she is taking her medications as per ordered.She was able to get assistance with her coverage for Jardiance. She is doing some exercising but not a regular routine.  ? ?Our next appointment is by telephone on June 25, 2022 ? ?Please call Johny Shock [phone] if you need to cancel or reschedule your appointment.  ? ?Please call the Suicide and Crisis Lifeline: 988 if you are experiencing a Mental Health or [place] or need someone to talk to. ? ?The patient verbalized understanding of instructions, educational materials, and care plan provided today and agreed to receive a mailed copy of patient instructions, educational materials, and care plan.  ? ?Telephone follow up appointment with care management team member scheduled for: ?The patient has been provided with contact information for the care management team and has been advised to call with any health related questions or concerns.  ? ?SIGNATURE ? ?Johny Shock BSN RN ?[facility] ?[phone] ? ? ?  ?

## 2022-03-25 ENCOUNTER — Ambulatory Visit: Payer: Medicare Other | Admitting: *Deleted

## 2022-03-25 DIAGNOSIS — Z20822 Contact with and (suspected) exposure to covid-19: Secondary | ICD-10-CM | POA: Diagnosis not present

## 2022-03-26 ENCOUNTER — Ambulatory Visit: Payer: Medicare Other | Admitting: *Deleted

## 2022-03-26 DIAGNOSIS — E1169 Type 2 diabetes mellitus with other specified complication: Secondary | ICD-10-CM | POA: Diagnosis not present

## 2022-04-01 DIAGNOSIS — N183 Chronic kidney disease, stage 3 unspecified: Secondary | ICD-10-CM | POA: Diagnosis not present

## 2022-04-01 DIAGNOSIS — E1122 Type 2 diabetes mellitus with diabetic chronic kidney disease: Secondary | ICD-10-CM | POA: Diagnosis not present

## 2022-04-01 DIAGNOSIS — E876 Hypokalemia: Secondary | ICD-10-CM | POA: Diagnosis not present

## 2022-04-01 DIAGNOSIS — I129 Hypertensive chronic kidney disease with stage 1 through stage 4 chronic kidney disease, or unspecified chronic kidney disease: Secondary | ICD-10-CM | POA: Diagnosis not present

## 2022-04-15 DIAGNOSIS — Z20822 Contact with and (suspected) exposure to covid-19: Secondary | ICD-10-CM | POA: Diagnosis not present

## 2022-04-15 DIAGNOSIS — D692 Other nonthrombocytopenic purpura: Secondary | ICD-10-CM | POA: Diagnosis not present

## 2022-04-15 DIAGNOSIS — E1122 Type 2 diabetes mellitus with diabetic chronic kidney disease: Secondary | ICD-10-CM | POA: Diagnosis not present

## 2022-05-01 DIAGNOSIS — N189 Chronic kidney disease, unspecified: Secondary | ICD-10-CM | POA: Diagnosis not present

## 2022-05-01 DIAGNOSIS — Z87891 Personal history of nicotine dependence: Secondary | ICD-10-CM | POA: Diagnosis not present

## 2022-05-01 DIAGNOSIS — R509 Fever, unspecified: Secondary | ICD-10-CM | POA: Diagnosis not present

## 2022-05-01 DIAGNOSIS — U071 COVID-19: Secondary | ICD-10-CM | POA: Diagnosis not present

## 2022-05-01 DIAGNOSIS — Z79899 Other long term (current) drug therapy: Secondary | ICD-10-CM | POA: Diagnosis not present

## 2022-05-01 DIAGNOSIS — R0902 Hypoxemia: Secondary | ICD-10-CM | POA: Diagnosis not present

## 2022-05-01 DIAGNOSIS — J1282 Pneumonia due to coronavirus disease 2019: Secondary | ICD-10-CM | POA: Diagnosis not present

## 2022-05-01 DIAGNOSIS — I10 Essential (primary) hypertension: Secondary | ICD-10-CM | POA: Diagnosis not present

## 2022-05-01 DIAGNOSIS — Z853 Personal history of malignant neoplasm of breast: Secondary | ICD-10-CM | POA: Diagnosis not present

## 2022-05-01 DIAGNOSIS — N179 Acute kidney failure, unspecified: Secondary | ICD-10-CM | POA: Diagnosis not present

## 2022-05-01 DIAGNOSIS — Z882 Allergy status to sulfonamides status: Secondary | ICD-10-CM | POA: Diagnosis not present

## 2022-05-01 DIAGNOSIS — R5383 Other fatigue: Secondary | ICD-10-CM | POA: Diagnosis not present

## 2022-05-01 DIAGNOSIS — Z7982 Long term (current) use of aspirin: Secondary | ICD-10-CM | POA: Diagnosis not present

## 2022-05-01 DIAGNOSIS — R531 Weakness: Secondary | ICD-10-CM | POA: Diagnosis not present

## 2022-05-01 DIAGNOSIS — F419 Anxiety disorder, unspecified: Secondary | ICD-10-CM | POA: Diagnosis not present

## 2022-05-01 DIAGNOSIS — E7800 Pure hypercholesterolemia, unspecified: Secondary | ICD-10-CM | POA: Diagnosis not present

## 2022-05-01 DIAGNOSIS — M1990 Unspecified osteoarthritis, unspecified site: Secondary | ICD-10-CM | POA: Diagnosis not present

## 2022-05-01 DIAGNOSIS — I129 Hypertensive chronic kidney disease with stage 1 through stage 4 chronic kidney disease, or unspecified chronic kidney disease: Secondary | ICD-10-CM | POA: Diagnosis not present

## 2022-05-01 DIAGNOSIS — R81 Glycosuria: Secondary | ICD-10-CM | POA: Diagnosis not present

## 2022-05-16 DIAGNOSIS — D692 Other nonthrombocytopenic purpura: Secondary | ICD-10-CM | POA: Diagnosis not present

## 2022-05-16 DIAGNOSIS — J449 Chronic obstructive pulmonary disease, unspecified: Secondary | ICD-10-CM | POA: Diagnosis not present

## 2022-05-16 DIAGNOSIS — N1832 Chronic kidney disease, stage 3b: Secondary | ICD-10-CM | POA: Diagnosis not present

## 2022-05-16 DIAGNOSIS — U071 COVID-19: Secondary | ICD-10-CM | POA: Diagnosis not present

## 2022-05-16 DIAGNOSIS — E1122 Type 2 diabetes mellitus with diabetic chronic kidney disease: Secondary | ICD-10-CM | POA: Diagnosis not present

## 2022-05-16 DIAGNOSIS — J9610 Chronic respiratory failure, unspecified whether with hypoxia or hypercapnia: Secondary | ICD-10-CM | POA: Diagnosis not present

## 2022-05-30 ENCOUNTER — Ambulatory Visit (INDEPENDENT_AMBULATORY_CARE_PROVIDER_SITE_OTHER): Payer: Medicare Other | Admitting: Podiatry

## 2022-05-30 ENCOUNTER — Encounter: Payer: Self-pay | Admitting: Podiatry

## 2022-05-30 VITALS — Temp 95.7°F

## 2022-05-30 DIAGNOSIS — S81801A Unspecified open wound, right lower leg, initial encounter: Secondary | ICD-10-CM | POA: Diagnosis not present

## 2022-05-30 DIAGNOSIS — M79674 Pain in right toe(s): Secondary | ICD-10-CM

## 2022-05-30 DIAGNOSIS — B351 Tinea unguium: Secondary | ICD-10-CM | POA: Diagnosis not present

## 2022-05-30 DIAGNOSIS — I739 Peripheral vascular disease, unspecified: Secondary | ICD-10-CM | POA: Diagnosis not present

## 2022-05-30 DIAGNOSIS — M79675 Pain in left toe(s): Secondary | ICD-10-CM

## 2022-05-30 DIAGNOSIS — E1140 Type 2 diabetes mellitus with diabetic neuropathy, unspecified: Secondary | ICD-10-CM

## 2022-05-30 MED ORDER — CEPHALEXIN 500 MG PO CAPS: 500.0000 mg | ORAL_CAPSULE | Freq: Three times a day (TID) | ORAL | 0 refills | Status: DC

## 2022-05-30 NOTE — Patient Instructions (Signed)
CALL DR. HODGES' OFFICE AND SCHEDULE APPOINTMENT WITH HER OFFICE FOR EVALUATION OF RIGHT LEG WOUND.  DRESSING CHANGES RIGHT LEG:   PHARMACY SHOPPING LIST: Saline or Wound Cleanser for cleaning wound 2 x 2 inch sterile gauze for cleaning wound Neosporin Cream  A. IF DISPENSED, WEAR SURGICAL SHOE OR WALKING BOOT AT ALL TIMES.  B. IF PRESCRIBED ORAL ANTIBIOTICS, TAKE ALL MEDICATION AS PRESCRIBED UNTIL ALL ARE GONE.  C. IF DOCTOR HAS DESIGNATED NONWEIGHTBEARING STATUS, PLEASE ADHERE TO INSTRUCTIONS.  CLEANSE ULCER WITH SALINE OR WOUND CLEANSER.  DAB DRY WITH GAUZE SPONGE.  APPLY A LIGHT AMOUNT OF Neosporin Cream TO BASE OF ULCER.  APPLY LIGHT DRESSING.  IF YOU EXPERIENCE ANY FEVER, CHILLS, NIGHTSWEATS, NAUSEA OR VOMITING, ELEVATED OR LOW BLOOD SUGARS, REPORT TO EMERGENCY ROOM.  IF YOU EXPERIENCE INCREASED REDNESS, PAIN, SWELLING, DISCOLORATION, ODOR, PUS, DRAINAGE OR WARMTH OF YOUR FOOT, REPORT TO EMERGENCY ROOM.

## 2022-05-31 NOTE — Progress Notes (Signed)
  Subjective:  Patient ID: Anna Stone, female    DOB: [birth year],  MRN: [id number]  Anna Stone presents to clinic today for at risk footcare. Patient has h/o diabetes, neuropathy and PAD and is seen for  and painful elongated mycotic toenails 1-5 bilaterally which are tender when wearing enclosed shoe gear. Pain is relieved with periodic professional debridement.  New problem(s): with chief concern of injury to right leg. Injury occurred 2 weeks ago. Injury recurred as a result of a bag of pots hitting her leg. She initially developed blood blister and she proceeded to lance it the next day. She now has a shallow wound. Patient states aggravating factor(s) is/are direct pressure.  Patient has been cleaning with hydrogen peroxide and applying Neosporin daily.         PCP is Marco Collie, MD , and last visit was January 16, 2022  Allergies  Allergen Reactions   Sulfa Antibiotics Itching and Rash   Adhesive [Tape] Dermatitis    Blisters the skin    Review of Systems: Negative except as noted in the HPI.  Objective:  There were no vitals filed for this visit.  Anna Stone is a pleasant 81 y.o. female in NAD. AAO X 3.  Vascular Examination: CFT <3 seconds b/l LE. Palpable DP pulse(s) b/l LE. Diminished PT pulse(s) b/l LE. Pedal hair absent. No pain with calf compression b/l. Lower extremity skin temperature gradient within normal limits. Trace edema noted BLE.  Dermatological Examination: Pedal integument with normal turgor, texture and tone BLE. No interdigital macerations noted b/l LE. Toenails 1-5 b/l elongated, discolored, dystrophic, thickened, crumbly with subungual debris and tenderness to dorsal palpation. No hyperkeratotic nor porokeratotic lesions present on today's visit. Well healed surgical scar medial aspect of right ankle.   Open wound right lower leg with residual blister roof 1.3 x 2.0 x 0.2 cm with mild erythema  surrounding area. No purulence, no active drainage, no odor. Minimal warmth.  Neurological Examination: Protective sensation intact 5/5 intact bilaterally with 10g monofilament b/l. Vibratory sensation intact b/l.  Musculoskeletal Examination: Muscle strength 5/5 to all lower extremity muscle groups bilaterally. Pes planus deformity noted bilateral LE. Utilizes cane for ambulation assistance.  Assessment/Plan: 1. Pain due to onychomycosis of toenails of both feet   2. Wound of right lower extremity, initial encounter   3. PVD (peripheral vascular disease) ([place])   4. Type 2 diabetes mellitus with diabetic neuropathy, unspecified whether long term insulin use ([place])      -Patient was evaluated and treated. All patient's and/or POA's questions/concerns answered on today's visit. -Educated patient to contact MD immediately with any lower extremity injuries. Educated her on chronic use of hydrogen peroxide which can slow healing of wound. Cleansed right leg wound and applied TAO and light dressing. Will start her on antibiotics today. I have asked her to contact Dr. Nyra Capes' office to schedule appointment. She related understanding. Printed instructions given to her on today. -Mycotic toenails 1-5 bilaterally were debrided in length and girth with sterile nail nippers and dremel without incident. -Rx for Keflex 500 mg po, #30, to be taken one capsule three times daily for 10 days. -Patient/POA to call should there be question/concern in the interim.   Return in about 3 months (around 08/30/2022).  Marzetta Board, DPM

## 2022-06-03 DIAGNOSIS — L97911 Non-pressure chronic ulcer of unspecified part of right lower leg limited to breakdown of skin: Secondary | ICD-10-CM | POA: Diagnosis not present

## 2022-06-03 DIAGNOSIS — Z6830 Body mass index (BMI) 30.0-30.9, adult: Secondary | ICD-10-CM | POA: Diagnosis not present

## 2022-06-03 DIAGNOSIS — Z23 Encounter for immunization: Secondary | ICD-10-CM | POA: Diagnosis not present

## 2022-06-11 ENCOUNTER — Other Ambulatory Visit: Payer: Self-pay | Admitting: *Deleted

## 2022-06-15 DIAGNOSIS — E1122 Type 2 diabetes mellitus with diabetic chronic kidney disease: Secondary | ICD-10-CM | POA: Diagnosis not present

## 2022-06-15 DIAGNOSIS — D692 Other nonthrombocytopenic purpura: Secondary | ICD-10-CM | POA: Diagnosis not present

## 2022-06-25 ENCOUNTER — Encounter: Payer: Self-pay | Admitting: *Deleted

## 2022-06-25 NOTE — Telephone Encounter (Signed)
This encounter was created in error - please disregard.

## 2022-07-08 DIAGNOSIS — E119 Type 2 diabetes mellitus without complications: Secondary | ICD-10-CM | POA: Diagnosis not present

## 2022-07-16 DIAGNOSIS — D692 Other nonthrombocytopenic purpura: Secondary | ICD-10-CM | POA: Diagnosis not present

## 2022-07-16 DIAGNOSIS — E1122 Type 2 diabetes mellitus with diabetic chronic kidney disease: Secondary | ICD-10-CM | POA: Diagnosis not present

## 2022-07-30 DIAGNOSIS — E1122 Type 2 diabetes mellitus with diabetic chronic kidney disease: Secondary | ICD-10-CM | POA: Diagnosis not present

## 2022-07-30 DIAGNOSIS — E1169 Type 2 diabetes mellitus with other specified complication: Secondary | ICD-10-CM | POA: Diagnosis not present

## 2022-08-06 DIAGNOSIS — I129 Hypertensive chronic kidney disease with stage 1 through stage 4 chronic kidney disease, or unspecified chronic kidney disease: Secondary | ICD-10-CM | POA: Diagnosis not present

## 2022-08-06 DIAGNOSIS — N183 Chronic kidney disease, stage 3 unspecified: Secondary | ICD-10-CM | POA: Diagnosis not present

## 2022-08-06 DIAGNOSIS — Z6830 Body mass index (BMI) 30.0-30.9, adult: Secondary | ICD-10-CM | POA: Diagnosis not present

## 2022-08-06 DIAGNOSIS — E1122 Type 2 diabetes mellitus with diabetic chronic kidney disease: Secondary | ICD-10-CM | POA: Diagnosis not present

## 2022-08-06 DIAGNOSIS — E782 Mixed hyperlipidemia: Secondary | ICD-10-CM | POA: Diagnosis not present

## 2022-08-06 DIAGNOSIS — E1169 Type 2 diabetes mellitus with other specified complication: Secondary | ICD-10-CM | POA: Diagnosis not present

## 2022-08-06 DIAGNOSIS — Z23 Encounter for immunization: Secondary | ICD-10-CM | POA: Diagnosis not present

## 2022-08-16 DIAGNOSIS — E1122 Type 2 diabetes mellitus with diabetic chronic kidney disease: Secondary | ICD-10-CM | POA: Diagnosis not present

## 2022-08-16 DIAGNOSIS — D692 Other nonthrombocytopenic purpura: Secondary | ICD-10-CM | POA: Diagnosis not present

## 2022-08-22 DIAGNOSIS — M85851 Other specified disorders of bone density and structure, right thigh: Secondary | ICD-10-CM | POA: Diagnosis not present

## 2022-08-22 DIAGNOSIS — E2839 Other primary ovarian failure: Secondary | ICD-10-CM | POA: Diagnosis not present

## 2022-09-05 ENCOUNTER — Ambulatory Visit: Payer: Medicare Other | Admitting: Podiatry

## 2022-09-05 DIAGNOSIS — M170 Bilateral primary osteoarthritis of knee: Secondary | ICD-10-CM | POA: Diagnosis not present

## 2022-09-15 DIAGNOSIS — D692 Other nonthrombocytopenic purpura: Secondary | ICD-10-CM | POA: Diagnosis not present

## 2022-09-15 DIAGNOSIS — E1122 Type 2 diabetes mellitus with diabetic chronic kidney disease: Secondary | ICD-10-CM | POA: Diagnosis not present

## 2022-09-16 ENCOUNTER — Ambulatory Visit (INDEPENDENT_AMBULATORY_CARE_PROVIDER_SITE_OTHER): Payer: Medicare Other | Admitting: Podiatry

## 2022-09-16 DIAGNOSIS — M79674 Pain in right toe(s): Secondary | ICD-10-CM | POA: Diagnosis not present

## 2022-09-16 DIAGNOSIS — E1140 Type 2 diabetes mellitus with diabetic neuropathy, unspecified: Secondary | ICD-10-CM

## 2022-09-16 DIAGNOSIS — M79675 Pain in left toe(s): Secondary | ICD-10-CM

## 2022-09-16 DIAGNOSIS — B351 Tinea unguium: Secondary | ICD-10-CM | POA: Diagnosis not present

## 2022-09-16 NOTE — Progress Notes (Signed)
  Subjective:  Patient ID: Anna Stone, female    DOB: 08/19/1941,  MRN: [id number]  Chief Complaint  Patient presents with   Nail Problem    Diabetic foot care. Blood sugar this morning 124.     81 y.o. female presents with the above complaint. History confirmed with patient.  Patient presents the office today for concern for painful thickened elongated nails present on both feet.  She is unable to trim them.  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  Objective:  Physical Exam: warm, good capillary refill, nail exam onychomycosis of the toenails, onycholysis, and dystrophic nails, no trophic changes or ulcerative lesions. DP pulses palpable, PT pulses palpable, and protective sensation absent Left Foot: normal exam, no swelling, tenderness, instability; ligaments intact, full range of motion of all ankle/foot joints  Right Foot: normal exam, no swelling, tenderness, instability; ligaments intact, full range of motion of all ankle/foot joints   No images are attached to the encounter.  Assessment:   1. Pain due to onychomycosis of toenails of both feet   2. Type 2 diabetes mellitus with diabetic neuropathy, unspecified whether long term insulin use ([place])      Plan:  Patient was evaluated and treated and all questions answered.  Onychomycosis with pain  -Nails palliatively debrided as below. -Educated on self-care  Procedure: Nail Debridement Rationale: Pain Type of Debridement: manual, sharp debridement. Instrumentation: Nail nipper, rotary burr. Number of Nails: 10  Return in about 3 months (around 12/17/2022) for [facility].         Everitt Amber, DPM [facility]

## 2022-10-14 DIAGNOSIS — M79604 Pain in right leg: Secondary | ICD-10-CM | POA: Diagnosis not present

## 2022-10-14 DIAGNOSIS — Z139 Encounter for screening, unspecified: Secondary | ICD-10-CM | POA: Diagnosis not present

## 2022-10-14 DIAGNOSIS — I82411 Acute embolism and thrombosis of right femoral vein: Secondary | ICD-10-CM | POA: Diagnosis not present

## 2022-10-14 DIAGNOSIS — Z1339 Encounter for screening examination for other mental health and behavioral disorders: Secondary | ICD-10-CM | POA: Diagnosis not present

## 2022-10-14 DIAGNOSIS — I82409 Acute embolism and thrombosis of unspecified deep veins of unspecified lower extremity: Secondary | ICD-10-CM | POA: Diagnosis not present

## 2022-10-14 DIAGNOSIS — Z136 Encounter for screening for cardiovascular disorders: Secondary | ICD-10-CM | POA: Diagnosis not present

## 2022-10-14 DIAGNOSIS — Z1331 Encounter for screening for depression: Secondary | ICD-10-CM | POA: Diagnosis not present

## 2022-10-14 DIAGNOSIS — Z0189 Encounter for other specified special examinations: Secondary | ICD-10-CM | POA: Diagnosis not present

## 2022-10-14 DIAGNOSIS — Z1389 Encounter for screening for other disorder: Secondary | ICD-10-CM | POA: Diagnosis not present

## 2022-10-14 DIAGNOSIS — Z6830 Body mass index (BMI) 30.0-30.9, adult: Secondary | ICD-10-CM | POA: Diagnosis not present

## 2022-10-14 DIAGNOSIS — R600 Localized edema: Secondary | ICD-10-CM | POA: Diagnosis not present

## 2022-10-14 DIAGNOSIS — Z0000 Encounter for general adult medical examination without abnormal findings: Secondary | ICD-10-CM | POA: Diagnosis not present

## 2022-10-16 DIAGNOSIS — D692 Other nonthrombocytopenic purpura: Secondary | ICD-10-CM | POA: Diagnosis not present

## 2022-10-16 DIAGNOSIS — E1122 Type 2 diabetes mellitus with diabetic chronic kidney disease: Secondary | ICD-10-CM | POA: Diagnosis not present

## 2022-10-21 DIAGNOSIS — E1122 Type 2 diabetes mellitus with diabetic chronic kidney disease: Secondary | ICD-10-CM | POA: Diagnosis not present

## 2022-10-21 DIAGNOSIS — N183 Chronic kidney disease, stage 3 unspecified: Secondary | ICD-10-CM | POA: Diagnosis not present

## 2022-10-21 DIAGNOSIS — I129 Hypertensive chronic kidney disease with stage 1 through stage 4 chronic kidney disease, or unspecified chronic kidney disease: Secondary | ICD-10-CM | POA: Diagnosis not present

## 2022-10-21 DIAGNOSIS — Z23 Encounter for immunization: Secondary | ICD-10-CM | POA: Diagnosis not present

## 2022-10-21 DIAGNOSIS — Z6829 Body mass index (BMI) 29.0-29.9, adult: Secondary | ICD-10-CM | POA: Diagnosis not present

## 2022-10-21 DIAGNOSIS — I82409 Acute embolism and thrombosis of unspecified deep veins of unspecified lower extremity: Secondary | ICD-10-CM | POA: Diagnosis not present

## 2022-11-15 DIAGNOSIS — D692 Other nonthrombocytopenic purpura: Secondary | ICD-10-CM | POA: Diagnosis not present

## 2022-11-15 DIAGNOSIS — J9610 Chronic respiratory failure, unspecified whether with hypoxia or hypercapnia: Secondary | ICD-10-CM | POA: Diagnosis not present

## 2022-11-22 DIAGNOSIS — E1169 Type 2 diabetes mellitus with other specified complication: Secondary | ICD-10-CM | POA: Diagnosis not present

## 2022-11-22 DIAGNOSIS — E1122 Type 2 diabetes mellitus with diabetic chronic kidney disease: Secondary | ICD-10-CM | POA: Diagnosis not present

## 2022-11-25 DIAGNOSIS — Z6829 Body mass index (BMI) 29.0-29.9, adult: Secondary | ICD-10-CM | POA: Diagnosis not present

## 2022-11-25 DIAGNOSIS — E1122 Type 2 diabetes mellitus with diabetic chronic kidney disease: Secondary | ICD-10-CM | POA: Diagnosis not present

## 2022-11-25 DIAGNOSIS — E782 Mixed hyperlipidemia: Secondary | ICD-10-CM | POA: Diagnosis not present

## 2022-11-25 DIAGNOSIS — E1169 Type 2 diabetes mellitus with other specified complication: Secondary | ICD-10-CM | POA: Diagnosis not present

## 2022-11-25 DIAGNOSIS — I129 Hypertensive chronic kidney disease with stage 1 through stage 4 chronic kidney disease, or unspecified chronic kidney disease: Secondary | ICD-10-CM | POA: Diagnosis not present

## 2022-11-25 DIAGNOSIS — N183 Chronic kidney disease, stage 3 unspecified: Secondary | ICD-10-CM | POA: Diagnosis not present

## 2022-12-05 DIAGNOSIS — Z6828 Body mass index (BMI) 28.0-28.9, adult: Secondary | ICD-10-CM | POA: Diagnosis not present

## 2022-12-05 DIAGNOSIS — Z23 Encounter for immunization: Secondary | ICD-10-CM | POA: Diagnosis not present

## 2022-12-05 DIAGNOSIS — E1122 Type 2 diabetes mellitus with diabetic chronic kidney disease: Secondary | ICD-10-CM | POA: Diagnosis not present

## 2022-12-05 DIAGNOSIS — I129 Hypertensive chronic kidney disease with stage 1 through stage 4 chronic kidney disease, or unspecified chronic kidney disease: Secondary | ICD-10-CM | POA: Diagnosis not present

## 2022-12-05 DIAGNOSIS — N183 Chronic kidney disease, stage 3 unspecified: Secondary | ICD-10-CM | POA: Diagnosis not present

## 2022-12-16 DIAGNOSIS — D692 Other nonthrombocytopenic purpura: Secondary | ICD-10-CM | POA: Diagnosis not present

## 2022-12-16 DIAGNOSIS — J9610 Chronic respiratory failure, unspecified whether with hypoxia or hypercapnia: Secondary | ICD-10-CM | POA: Diagnosis not present

## 2022-12-17 ENCOUNTER — Ambulatory Visit (INDEPENDENT_AMBULATORY_CARE_PROVIDER_SITE_OTHER): Payer: Medicare Other | Admitting: Podiatry

## 2022-12-17 DIAGNOSIS — M79674 Pain in right toe(s): Secondary | ICD-10-CM | POA: Diagnosis not present

## 2022-12-17 DIAGNOSIS — I739 Peripheral vascular disease, unspecified: Secondary | ICD-10-CM

## 2022-12-17 DIAGNOSIS — B351 Tinea unguium: Secondary | ICD-10-CM

## 2022-12-17 DIAGNOSIS — M79675 Pain in left toe(s): Secondary | ICD-10-CM

## 2022-12-17 DIAGNOSIS — E1140 Type 2 diabetes mellitus with diabetic neuropathy, unspecified: Secondary | ICD-10-CM

## 2022-12-17 NOTE — Progress Notes (Signed)
  Subjective:  Patient ID: Anna Stone, female    DOB: Feb 25, 1941,  MRN: [id number]  Chief Complaint  Patient presents with   Nail Problem    Diabetic Foot Care     82 y.o. female presents with the above complaint. History confirmed with patient.  Patient presents the office today for concern for painful thickened elongated nails present on both feet.  She is unable to trim them.  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  Objective:  Physical Exam: warm, good capillary refill, nail exam onychomycosis of the toenails, onycholysis, and dystrophic nails, no trophic changes or ulcerative lesions. DP pulses palpable, PT pulses palpable, and protective sensation absent Left Foot: normal exam, no swelling, tenderness, instability; ligaments intact, full range of motion of all ankle/foot joints  Right Foot: normal exam, no swelling, tenderness, instability; ligaments intact, full range of motion of all ankle/foot joints   No images are attached to the encounter.  Assessment:   1. Pain due to onychomycosis of toenails of both feet   2. Type 2 diabetes mellitus with diabetic neuropathy, unspecified whether long term insulin use ([place])   3. PVD (peripheral vascular disease) ([place])       Plan:  Patient was evaluated and treated and all questions answered.  Onychomycosis with pain  -Nails palliatively debrided as below. -Educated on self-care  Procedure: Nail Debridement Rationale: Pain Type of Debridement: manual, sharp debridement. Instrumentation: Nail nipper, rotary burr. Number of Nails: 10  Return in about 3 months (around 03/18/2023) for [facility].         Everitt Amber, DPM [facility] [place] [facility]

## 2023-01-03 ENCOUNTER — Telehealth: Payer: Self-pay

## 2023-01-03 NOTE — Progress Notes (Signed)
[facility]) [place]   01/03/2023  Anna Stone 09-06-1941 [id number]  Reason for referral: Medication Assistance with Xarelto  Referral source: Dr. Nyra Capes Current insurance: [facility]  Reason for call: Medication Assistance   Outreach:  Unsuccessful telephone call attempt #1 to patient.   Unable to leave message  Plan:  -I will make another outreach attempt to patient within 3-4 business days.  Thank you for allowing [facility] pharmacy to be a part of this patient's care.  Kristeen Miss, PharmD Clinical Pharmacist [place] Cell: [phone]

## 2023-01-09 ENCOUNTER — Telehealth: Payer: Self-pay

## 2023-01-09 NOTE — Progress Notes (Signed)
[facility]) [place]   01/09/2023  Anna Stone 01-11-1941 [id number]  Reason for referral: Medication Assistance with Xarelto  Referral source: Dr. Nyra Capes Current insurance: [facility]  Reason for call: Medication Assistance   Outreach:  Unsuccessful telephone call attempt #2 to patient.   Message left with family requesting a return call from patient  Plan:  -I will make another outreach attempt to patient in 5-7 business days.  Thank you for allowing [facility] pharmacy to be a part of this patient's care.  Kristeen Miss, PharmD Clinical Pharmacist [place] Cell: [phone]

## 2023-01-16 DIAGNOSIS — D692 Other nonthrombocytopenic purpura: Secondary | ICD-10-CM | POA: Diagnosis not present

## 2023-01-16 DIAGNOSIS — J9610 Chronic respiratory failure, unspecified whether with hypoxia or hypercapnia: Secondary | ICD-10-CM | POA: Diagnosis not present

## 2023-01-17 ENCOUNTER — Telehealth: Payer: Self-pay

## 2023-01-17 NOTE — Progress Notes (Signed)
[facility])  [place] Team    01/17/2023  Reema Chick [place] [birth year] [id number]  Reason for referral: Medication Assistance  Referral source: Dr. Nyra Capes  Current insurance: [employer] [facility]   Outreach:  Successful telephone call with Mrs. Nobile.  HIPAA identifiers verified.   [place] team received a referral for Xarelto. Patient is currently on Eliquis but does not meet the OOP for PAP for this medication; provider was open to switching patient to Xarelto should she qualify (per referral). Unfortunately, given patient's income this pharmacist confirmed she does not meet the OOP for Eliquis at this time. Currently, patient has spent ~$580 in prescriptions and spouse medications are inexpensive. The medication assistance for Xarelto is focused on patients in the Medicare Coverage gap in which patients pay ~$85 for 30 days supply or ~$240 for 90 days - which is comparable to what the patient is currently paying for Eliquis. As such, Mrs. Chicas opted to declined medication assistance at this time. Patient was informed to reach back out if interested later this year.    To my knowledge, I was unable to find additional grants/PAP program for anticoagulation related to documented diagnosis for use of medication.    Medication Review Findings:  [place] - patient reported she receives medication assistance already    Extra Help:  Not eligible for Extra Help Low Income Subsidy based on reported income and assets  Plan: Will close [facility] pharmacy case as no further medication needs identified at this time.  Am happy to assist in the future as needed.    Thank you for allowing pharmacy to be a part of this patient's care. Kristeen Miss, PharmD Clinical Pharmacist [place] Cell: [phone]

## 2023-02-13 DIAGNOSIS — J9610 Chronic respiratory failure, unspecified whether with hypoxia or hypercapnia: Secondary | ICD-10-CM | POA: Diagnosis not present

## 2023-02-13 DIAGNOSIS — J449 Chronic obstructive pulmonary disease, unspecified: Secondary | ICD-10-CM | POA: Diagnosis not present

## 2023-02-13 DIAGNOSIS — J111 Influenza due to unidentified influenza virus with other respiratory manifestations: Secondary | ICD-10-CM | POA: Diagnosis not present

## 2023-02-14 DIAGNOSIS — J9610 Chronic respiratory failure, unspecified whether with hypoxia or hypercapnia: Secondary | ICD-10-CM | POA: Diagnosis not present

## 2023-02-14 DIAGNOSIS — J449 Chronic obstructive pulmonary disease, unspecified: Secondary | ICD-10-CM | POA: Diagnosis not present

## 2023-02-15 ENCOUNTER — Emergency Department (HOSPITAL_COMMUNITY): Payer: Medicare Other

## 2023-02-15 ENCOUNTER — Observation Stay (HOSPITAL_COMMUNITY)
Admission: EM | Admit: 2023-02-15 | Discharge: 2023-02-16 | Disposition: A | Discharge status: Home Health | Payer: Medicare Other | Attending: Internal Medicine | Admitting: Internal Medicine

## 2023-02-15 DIAGNOSIS — N183 Chronic kidney disease, stage 3 unspecified: Secondary | ICD-10-CM | POA: Diagnosis present

## 2023-02-15 DIAGNOSIS — R197 Diarrhea, unspecified: Secondary | ICD-10-CM | POA: Diagnosis present

## 2023-02-15 DIAGNOSIS — Z86711 Personal history of pulmonary embolism: Secondary | ICD-10-CM | POA: Diagnosis not present

## 2023-02-15 DIAGNOSIS — R2681 Unsteadiness on feet: Secondary | ICD-10-CM | POA: Insufficient documentation

## 2023-02-15 DIAGNOSIS — M25562 Pain in left knee: Secondary | ICD-10-CM | POA: Diagnosis not present

## 2023-02-15 DIAGNOSIS — E785 Hyperlipidemia, unspecified: Secondary | ICD-10-CM | POA: Diagnosis not present

## 2023-02-15 DIAGNOSIS — R55 Syncope and collapse: Secondary | ICD-10-CM | POA: Diagnosis not present

## 2023-02-15 DIAGNOSIS — R5381 Other malaise: Secondary | ICD-10-CM | POA: Diagnosis present

## 2023-02-15 DIAGNOSIS — E1159 Type 2 diabetes mellitus with other circulatory complications: Secondary | ICD-10-CM | POA: Diagnosis not present

## 2023-02-15 DIAGNOSIS — E876 Hypokalemia: Secondary | ICD-10-CM | POA: Diagnosis present

## 2023-02-15 DIAGNOSIS — Z7984 Long term (current) use of oral hypoglycemic drugs: Secondary | ICD-10-CM | POA: Insufficient documentation

## 2023-02-15 DIAGNOSIS — S3993XA Unspecified injury of pelvis, initial encounter: Secondary | ICD-10-CM | POA: Diagnosis not present

## 2023-02-15 DIAGNOSIS — E1169 Type 2 diabetes mellitus with other specified complication: Secondary | ICD-10-CM | POA: Diagnosis not present

## 2023-02-15 DIAGNOSIS — J1089 Influenza due to other identified influenza virus with other manifestations: Secondary | ICD-10-CM | POA: Insufficient documentation

## 2023-02-15 DIAGNOSIS — G4733 Obstructive sleep apnea (adult) (pediatric): Secondary | ICD-10-CM | POA: Insufficient documentation

## 2023-02-15 DIAGNOSIS — Z859 Personal history of malignant neoplasm, unspecified: Secondary | ICD-10-CM | POA: Insufficient documentation

## 2023-02-15 DIAGNOSIS — Z79899 Other long term (current) drug therapy: Secondary | ICD-10-CM | POA: Diagnosis not present

## 2023-02-15 DIAGNOSIS — Z7901 Long term (current) use of anticoagulants: Secondary | ICD-10-CM | POA: Insufficient documentation

## 2023-02-15 DIAGNOSIS — J101 Influenza due to other identified influenza virus with other respiratory manifestations: Secondary | ICD-10-CM | POA: Diagnosis present

## 2023-02-15 DIAGNOSIS — Z1152 Encounter for screening for COVID-19: Secondary | ICD-10-CM | POA: Insufficient documentation

## 2023-02-15 DIAGNOSIS — N179 Acute kidney failure, unspecified: Secondary | ICD-10-CM | POA: Diagnosis not present

## 2023-02-15 DIAGNOSIS — E663 Overweight: Secondary | ICD-10-CM | POA: Diagnosis present

## 2023-02-15 DIAGNOSIS — Z7982 Long term (current) use of aspirin: Secondary | ICD-10-CM | POA: Insufficient documentation

## 2023-02-15 DIAGNOSIS — N1831 Chronic kidney disease, stage 3a: Secondary | ICD-10-CM

## 2023-02-15 DIAGNOSIS — I129 Hypertensive chronic kidney disease with stage 1 through stage 4 chronic kidney disease, or unspecified chronic kidney disease: Secondary | ICD-10-CM | POA: Diagnosis not present

## 2023-02-15 DIAGNOSIS — E669 Obesity, unspecified: Secondary | ICD-10-CM | POA: Insufficient documentation

## 2023-02-15 DIAGNOSIS — W19XXXA Unspecified fall, initial encounter: Secondary | ICD-10-CM | POA: Diagnosis not present

## 2023-02-15 DIAGNOSIS — E860 Dehydration: Secondary | ICD-10-CM | POA: Diagnosis not present

## 2023-02-15 DIAGNOSIS — N189 Chronic kidney disease, unspecified: Secondary | ICD-10-CM

## 2023-02-15 DIAGNOSIS — M5136 Other intervertebral disc degeneration, lumbar region: Secondary | ICD-10-CM | POA: Diagnosis not present

## 2023-02-15 DIAGNOSIS — Z6830 Body mass index (BMI) 30.0-30.9, adult: Secondary | ICD-10-CM | POA: Insufficient documentation

## 2023-02-15 DIAGNOSIS — Z86718 Personal history of other venous thrombosis and embolism: Secondary | ICD-10-CM | POA: Diagnosis not present

## 2023-02-15 DIAGNOSIS — Z0389 Encounter for observation for other suspected diseases and conditions ruled out: Secondary | ICD-10-CM | POA: Diagnosis not present

## 2023-02-15 DIAGNOSIS — I152 Hypertension secondary to endocrine disorders: Secondary | ICD-10-CM | POA: Diagnosis present

## 2023-02-15 DIAGNOSIS — E1122 Type 2 diabetes mellitus with diabetic chronic kidney disease: Secondary | ICD-10-CM | POA: Diagnosis not present

## 2023-02-15 DIAGNOSIS — K7689 Other specified diseases of liver: Secondary | ICD-10-CM | POA: Diagnosis not present

## 2023-02-15 DIAGNOSIS — T1490XA Injury, unspecified, initial encounter: Secondary | ICD-10-CM | POA: Diagnosis not present

## 2023-02-15 DIAGNOSIS — S0990XA Unspecified injury of head, initial encounter: Secondary | ICD-10-CM | POA: Diagnosis not present

## 2023-02-15 DIAGNOSIS — R112 Nausea with vomiting, unspecified: Secondary | ICD-10-CM | POA: Diagnosis not present

## 2023-02-15 DIAGNOSIS — M4316 Spondylolisthesis, lumbar region: Secondary | ICD-10-CM | POA: Diagnosis not present

## 2023-02-15 DIAGNOSIS — R42 Dizziness and giddiness: Secondary | ICD-10-CM | POA: Diagnosis not present

## 2023-02-15 LAB — RESP PANEL BY RT-PCR (RSV, FLU A&B, COVID)  RVPGX2
Influenza A by PCR: POSITIVE — AB
Influenza B by PCR: NEGATIVE
Resp Syncytial Virus by PCR: NEGATIVE
SARS Coronavirus 2 by RT PCR: NEGATIVE

## 2023-02-15 LAB — COMPREHENSIVE METABOLIC PANEL
ALT: 14 U/L (ref 0–44)
AST: 29 U/L (ref 15–41)
Albumin: 3.7 g/dL (ref 3.5–5.0)
Alkaline Phosphatase: 61 U/L (ref 38–126)
Anion gap: 15 (ref 5–15)
BUN: 24 mg/dL — ABNORMAL HIGH (ref 8–23)
CO2: 24 mmol/L (ref 22–32)
Calcium: 8.8 mg/dL — ABNORMAL LOW (ref 8.9–10.3)
Chloride: 101 mmol/L (ref 98–111)
Creatinine, Ser: 1.44 mg/dL — ABNORMAL HIGH (ref 0.44–1.00)
GFR, Estimated: 36 mL/min — ABNORMAL LOW (ref >60)
Glucose, Bld: 117 mg/dL — ABNORMAL HIGH (ref 70–99)
Potassium: 2.8 mmol/L — ABNORMAL LOW (ref 3.5–5.1)
Sodium: 140 mmol/L (ref 135–145)
Total Bilirubin: 0.7 mg/dL (ref 0.3–1.2)
Total Protein: 7.2 g/dL (ref 6.5–8.1)

## 2023-02-15 LAB — MAGNESIUM: Magnesium: 1.9 mg/dL (ref 1.7–2.4)

## 2023-02-15 LAB — CBC
HCT: 42.2 % (ref 36.0–46.0)
Hemoglobin: 13.6 g/dL (ref 12.0–15.0)
MCH: 27.3 pg (ref 26.0–34.0)
MCHC: 32.2 g/dL (ref 30.0–36.0)
MCV: 84.6 fL (ref 80.0–100.0)
Platelets: 121 10*3/uL — ABNORMAL LOW (ref 150–400)
RBC: 4.99 MIL/uL (ref 3.87–5.11)
RDW: 14.8 % (ref 11.5–15.5)
WBC: 3.8 10*3/uL — ABNORMAL LOW (ref 4.0–10.5)
nRBC: 0 % (ref 0.0–0.2)

## 2023-02-15 LAB — I-STAT CHEM 8, ED
BUN: 25 mg/dL — ABNORMAL HIGH (ref 8–23)
Calcium, Ion: 1.1 mmol/L — ABNORMAL LOW (ref 1.15–1.40)
Chloride: 101 mmol/L (ref 98–111)
Creatinine, Ser: 1.5 mg/dL — ABNORMAL HIGH (ref 0.44–1.00)
Glucose, Bld: 112 mg/dL — ABNORMAL HIGH (ref 70–99)
HCT: 42 % (ref 36.0–46.0)
Hemoglobin: 14.3 g/dL (ref 12.0–15.0)
Potassium: 2.9 mmol/L — ABNORMAL LOW (ref 3.5–5.1)
Sodium: 140 mmol/L (ref 135–145)
TCO2: 27 mmol/L (ref 22–32)

## 2023-02-15 LAB — PROTIME-INR
INR: 1.2 (ref 0.8–1.2)
Prothrombin Time: 15.4 seconds — ABNORMAL HIGH (ref 11.4–15.2)

## 2023-02-15 LAB — CBG MONITORING, ED: Glucose-Capillary: 112 mg/dL — ABNORMAL HIGH (ref 70–99)

## 2023-02-15 LAB — CDS SEROLOGY

## 2023-02-15 LAB — SAMPLE TO BLOOD BANK

## 2023-02-15 LAB — LACTIC ACID, PLASMA: Lactic Acid, Venous: 1.5 mmol/L (ref 0.5–1.9)

## 2023-02-15 LAB — ETHANOL: Alcohol, Ethyl (B): <10 mg/dL (ref <10)

## 2023-02-15 MED ORDER — SODIUM CHLORIDE 0.9 % IV SOLN: INTRAVENOUS | Status: DC

## 2023-02-15 MED ORDER — SODIUM CHLORIDE 0.9 % IV BOLUS
1000.0000 mL | Freq: Once | INTRAVENOUS | Status: AC
  Administered 2023-02-15: 1000 mL via INTRAVENOUS

## 2023-02-15 MED ORDER — OSELTAMIVIR PHOSPHATE 30 MG PO CAPS
30.0000 mg | ORAL_CAPSULE | Freq: Every day | ORAL | Status: DC
  Administered 2023-02-16: 30 mg via ORAL
  Filled 2023-02-15 (×3): qty 1

## 2023-02-15 MED ORDER — POTASSIUM CHLORIDE CRYS ER 20 MEQ PO TBCR: 40.0000 meq | EXTENDED_RELEASE_TABLET | Freq: Once | ORAL | Status: DC

## 2023-02-15 MED ORDER — ONDANSETRON HCL 4 MG/2ML IJ SOLN
4.0000 mg | Freq: Once | INTRAMUSCULAR | Status: AC
  Administered 2023-02-15: 4 mg via INTRAVENOUS
  Filled 2023-02-15: qty 2

## 2023-02-15 MED ORDER — POTASSIUM CHLORIDE 10 MEQ/100ML IV SOLN
10.0000 meq | INTRAVENOUS | Status: AC
  Administered 2023-02-15 – 2023-02-16 (×3): 10 meq via INTRAVENOUS
  Filled 2023-02-15 (×3): qty 100

## 2023-02-15 NOTE — H&P (Incomplete)
Anna Stone [id number] DOB: 1941-09-08 DOA: 02/15/2023     PCP: Marco Collie, MD   Outpatient Specialists: * NONE CARDS: * Dr. NEphrology: *  Dr. NEurology *   Dr. Pulmonary *  Dr.  Oncology * Dr. Fabienne Bruns* Dr.  Sadie Haber, [facility]) No care team member to display Urology Dr. *  Patient arrived to ER on 02/15/23 at 2139 Referred by Attending Godfrey Pick, MD   Patient coming from:    home Lives alone,   *** With family    Chief Complaint:   Chief Complaint  Patient presents with  . Fall  . Loss of Consciousness    HPI: Anna Stone is a 82 y.o. female with medical history significant of history of DVT on Xarelto, diabetes, hypertension, CKD, HLD    Presented with   fall Patient had a syncopal episode at home with a fall she has been feeling weak for the past 1 week with nausea and vomiting and cough Started recently on antibiotics when she was walking to the bathroom using a walker she fell down hitting her head and left knee Reports she hit the back of her head against the floor no chest pain or shortness of breath associated this Patient is on Xarelto history of DVT and cancer   Had some cold symptoms and that is why she ws given abx The NVD was even before than    Regarding pertinent Chronic problems:     Hyperlipidemia - on statins crestor Lipid Panel  No results found for: "CHOL", "TRIG", "HDL", "CHOLHDL", "VLDL", "LDLCALC", "LDLDIRECT", "LABVLDL"   HTN on NOrvasc, cardura, HCTZ      DM 2 -  Lab Results  Component Value Date   HGBA1C 7.2 (H) 09/01/2020   PO meds only,     obesity-   BMI Readings from Last 1 Encounters:  02/15/23 30.24 kg/m     *** Asthma -well *** controlled on home inhalers/ nebs f                        ***last no prior***admission  ***                       No ***history of intubation  *** COPD - not **followed by pulmonology *** not  on baseline oxygen  *L,    *** OSA -on nocturnal oxygen, *CPAP,  *noncompliant with CPAP     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       While in ER:   Found to have AKI and potassium down to 2.8    Ordered  CT HEAD neck   NON acute CT lumbar and thoracic spine unremarkable  CXR neg Left knee and pelvis unremarkable    CT chest -  nonacute,  evidence of infiltrate  Following Medications were ordered in ER: Medications  sodium chloride 0.9 % bolus 1,000 mL (1,000 mLs Intravenous New Bag/Given 02/15/23 2207)    And  0.9 %  sodium chloride infusion ( Intravenous New Bag/Given 02/15/23 2246)  potassium chloride SA (KLOR-CON M) CR tablet 40 mEq (40 mEq Oral Not Given 02/15/23 2235)  potassium chloride 10 mEq in 100 mL IVPB (10 mEq Intravenous New Bag/Given 02/15/23 2246)  ondansetron (ZOFRAN) injection 4 mg (4 mg Intravenous Given 02/15/23 2207)       ED Triage Vitals  Enc Vitals Group     BP 02/15/23 2150 130/76  Pulse Rate 02/15/23 2153 67     Resp 02/15/23 2153 14     Temp 02/15/23 2153 97.7 F (36.5 C)     Temp src --      SpO2 02/15/23 2153 99 %     Weight 02/15/23 2154 165 lb 5.5 oz (75 kg)     Height 02/15/23 2154 '5\' 2"'$  (1.575 m)     Head Circumference --      Peak Flow --      Pain Score --      Pain Loc --      Pain Edu? --      Excl. in [place]? --   TMAX(24)@     _________________________________________ Significant initial  Findings: Abnormal Labs Reviewed  COMPREHENSIVE METABOLIC PANEL - Abnormal; Notable for the following components:      Result Value   Potassium 2.8 (*)    Glucose, Bld 117 (*)    BUN 24 (*)    Creatinine, Ser 1.44 (*)    Calcium 8.8 (*)    GFR, Estimated 36 (*)    All other components within normal limits  CBC - Abnormal; Notable for the following components:   WBC 3.8 (*)    Platelets 121 (*)    All other components within normal limits  PROTIME-INR - Abnormal; Notable for the following components:   Prothrombin Time 15.4 (*)    All other components within normal limits  I-STAT CHEM 8,  ED - Abnormal; Notable for the following components:   Potassium 2.9 (*)    BUN 25 (*)    Creatinine, Ser 1.50 (*)    Glucose, Bld 112 (*)    Calcium, Ion 1.10 (*)    All other components within normal limits  CBG MONITORING, ED - Abnormal; Notable for the following components:   Glucose-Capillary 112 (*)    All other components within normal limits     _________________________ Troponin ***ordered ECG: Ordered Personally reviewed and interpreted by me showing: HR : 67 Rhythm: Sinus rhythm Low voltage, precordial leads Abnormal R-wave progression, early transition     The recent clinical data is shown below. Vitals:   02/15/23 2153 02/15/23 2154 02/15/23 2154 02/15/23 2253  BP:    138/62  Pulse:  64  70  Resp:  14  17  Temp: 97.7 F (36.5 C)     SpO2:  99%  94%  Weight:   75 kg   Height:   '5\' 2"'$  (1.575 m)     WBC     Component Value Date/Time   WBC 3.8 (L) 02/15/2023 2200   LYMPHSABS 0.5 (L) 09/03/2020 0102   MONOABS 0.3 09/03/2020 0102   EOSABS 0.1 09/03/2020 0102   BASOSABS 0.0 09/03/2020 0102    Lactic Acid, Venous    Component Value Date/Time   LATICACIDVEN 1.5 02/15/2023 2200     Procalcitonin *** Ordered      UA  ordered ***   Urine analysis:    Component Value Date/Time   COLORURINE YELLOW 09/18/2020 1137   APPEARANCEUR HAZY (A) 09/18/2020 1137   LABSPEC 1.012 09/18/2020 1137   PHURINE 5.0 09/18/2020 1137   GLUCOSEU NEGATIVE 09/18/2020 1137   HGBUR NEGATIVE 09/18/2020 1137   [place] 09/18/2020 1137   [place] 09/18/2020 1137   PROTEINUR NEGATIVE 09/18/2020 1137   NITRITE NEGATIVE 09/18/2020 1137   LEUKOCYTESUR NEGATIVE 09/18/2020 1137    Results for orders placed or performed during the hospital encounter of 02/15/23  Resp panel by  RT-PCR (RSV, Flu A&B, Covid) Anterior Nasal Swab     Status: Abnormal   Collection Time: 02/15/23 10:39 PM   Specimen: Anterior Nasal Swab  Result Value Ref Range Status   SARS  Coronavirus 2 by RT PCR NEGATIVE NEGATIVE Final   Influenza A by PCR POSITIVE (A) NEGATIVE Final   Influenza B by PCR NEGATIVE NEGATIVE Final         Resp Syncytial Virus by PCR NEGATIVE NEGATIVE Final          _______________________________________________ Hospitalist was called for admission for syncope hypokalemia  The following Work up has been ordered so far:  Orders Placed This Encounter  Procedures  . Resp panel by RT-PCR (RSV, Flu A&B, Covid) Anterior Nasal Swab  . DG Chest Port 1 View  . DG Pelvis Portable  . CT HEAD WO CONTRAST  . CT CERVICAL SPINE WO CONTRAST  . DG Knee Left Port  . CT CHEST ABDOMEN PELVIS WO CONTRAST  . CT L-SPINE NO CHARGE  . CT T-SPINE NO CHARGE  . Comprehensive metabolic panel  . CBC  . Ethanol  . Urinalysis, Routine w reflex microscopic -Urine, Clean Catch  . Lactic acid, plasma  . Protime-INR  . CDS serology  . Magnesium  . Diet NPO time specified  . Cardiac monitoring  . Measure blood pressure  . Initiate Carrier Fluid Protocol  . Consult to hospitalist  . Pulse oximetry, continuous  . I-Stat Chem 8, ED  . CBG monitoring, ED  . EKG 12-Lead  . Sample to Blood Bank     OTHER Significant initial  Findings:  labs showing:    Recent Labs  Lab 02/15/23 2200 02/15/23 2213  NA 140 140  K 2.8* 2.9*  CO2 24  --   GLUCOSE 117* 112*  BUN 24* 25*  CREATININE 1.44* 1.50*  CALCIUM 8.8*  --     Cr  * stable,  Up from baseline see below Lab Results  Component Value Date   CREATININE 1.50 (H) 02/15/2023   CREATININE 1.44 (H) 02/15/2023   CREATININE 0.92 09/25/2020    Recent Labs  Lab 02/15/23 2200  AST 29  ALT 14  ALKPHOS 61  BILITOT 0.7  PROT 7.2  ALBUMIN 3.7   Lab Results  Component Value Date   CALCIUM 8.8 (L) 02/15/2023          Plt: Lab Results  Component Value Date   PLT 121 (L) 02/15/2023       COVID-19 Labs  No results for input(s): "DDIMER", "FERRITIN", "LDH", "CRP" in the last 72 hours.  Lab  Results  Component Value Date   SARSCOV2NAA NEGATIVE 09/26/2020   [place] NEGATIVE 09/17/2020   [place] NEGATIVE 08/31/2020     Arterial ***Venous  Blood Gas result:  pH *** pCO2 ***; pO2 ***;     %O2 Sat ***.  ABG    Component Value Date/Time   TCO2 27 02/15/2023 2213         Recent Labs  Lab 02/15/23 2200 02/15/23 2213  WBC 3.8*  --   HGB 13.6 14.3  HCT 42.2 42.0  MCV 84.6  --   PLT 121*  --     HG/HCT * stable,  Down *Up from baseline see below    Component Value Date/Time   HGB 14.3 02/15/2023 2213   HCT 42.0 02/15/2023 2213   MCV 84.6 02/15/2023 2200      No results for input(s): "LIPASE", "AMYLASE" in the last 168 hours. No  results for input(s): "AMMONIA" in the last 168 hours.    Cardiac Panel (last 3 results) No results for input(s): "CKTOTAL", "CKMB", "TROPONINI", "RELINDX" in the last 72 hours.  .car BNP (last 3 results) No results for input(s): "BNP" in the last 8760 hours.    DM  labs:  HbA1C: No results for input(s): "HGBA1C" in the last 8760 hours.     CBG (last 3)  Recent Labs    02/15/23 2159  GLUCAP 112*          Cultures:    Component Value Date/Time   SDES BLOOD LEFT ANTECUBITAL 09/22/2020 1357   SPECREQUEST  09/22/2020 1357    BOTTLES DRAWN AEROBIC AND ANAEROBIC Blood Culture adequate volume   CULT  09/22/2020 1357    NO GROWTH 5 DAYS Performed at [facility], [place] [address], [place], [place] [address]    REPTSTATUS 09/27/2020 FINAL 09/22/2020 1357     Radiological Exams on Admission: CT T-SPINE NO CHARGE  Result Date: 02/15/2023 CLINICAL DATA:  Trauma EXAM: CT THORACIC SPINE WITHOUT CONTRAST TECHNIQUE: Multidetector CT images of the thoracic were obtained using the standard protocol without intravenous contrast. RADIATION DOSE REDUCTION: This exam was performed according to the departmental dose-optimization program which includes automated exposure control, adjustment of the mA and/or kV according to  patient size and/or use of iterative reconstruction technique. COMPARISON:  None Available. FINDINGS: Alignment: Normal. Vertebrae: No acute fracture or focal pathologic process. Paraspinal and other soft tissues: Negative. Disc levels: Early anterior spurring in the mid to lower thoracic spine. No disc herniation. IMPRESSION: No acute bony abnormality. Electronically Signed   By: Rolm Baptise M.D.   On: 02/15/2023 23:19   CT L-SPINE NO CHARGE  Result Date: 02/15/2023 CLINICAL DATA:  Trauma EXAM: CT LUMBAR SPINE WITHOUT CONTRAST TECHNIQUE: Multidetector CT imaging of the lumbar spine was performed without intravenous contrast administration. Multiplanar CT image reconstructions were also generated. RADIATION DOSE REDUCTION: This exam was performed according to the departmental dose-optimization program which includes automated exposure control, adjustment of the mA and/or kV according to patient size and/or use of iterative reconstruction technique. COMPARISON:  None Available. FINDINGS: Segmentation: 5 lumbar type vertebrae. Alignment: Slight anterolisthesis of L4 on L5 and L5 on S1 related to facet disease. Vertebrae: No acute fracture or focal pathologic process. Paraspinal and other soft tissues: Negative. Disc levels: No visible disc herniation. Disc spaces maintained. Degenerative facet disease in the lower lumbar spine. IMPRESSION: No acute bony abnormality. Electronically Signed   By: Rolm Baptise M.D.   On: 02/15/2023 23:18   CT CHEST ABDOMEN PELVIS WO CONTRAST  Result Date: 02/15/2023 CLINICAL DATA:  Polytrauma, blunt EXAM: CT CHEST, ABDOMEN AND PELVIS WITHOUT CONTRAST TECHNIQUE: Multidetector CT imaging of the chest, abdomen and pelvis was performed following the standard protocol without IV contrast. RADIATION DOSE REDUCTION: This exam was performed according to the departmental dose-optimization program which includes automated exposure control, adjustment of the mA and/or kV according to patient  size and/or use of iterative reconstruction technique. COMPARISON:  None Available. FINDINGS: CT CHEST FINDINGS Cardiovascular: Heart is normal size. Moderate coronary artery and aortic calcifications. No aneurysm. Mediastinum/Nodes: No mediastinal, hilar, or axillary adenopathy. Trachea and esophagus are unremarkable. Thyroid unremarkable. Lungs/Pleura: No confluent opacities, effusions or pneumothorax. Musculoskeletal: Chest wall soft tissues are unremarkable. No acute bony abnormality. CT ABDOMEN PELVIS FINDINGS Hepatobiliary: 2 cm low-density lesion in the left hepatic lobe, likely cyst. 3 cm exophytic low-density lesion in the inferior right hepatic lobe, also  likely cysts. Prior cholecystectomy. No evidence of a hepatic injury or perihepatic hematoma. Pancreas: No focal abnormality or ductal dilatation. Spleen: No splenic injury or perisplenic hematoma. Adrenals/Urinary Tract: No adrenal hemorrhage or renal injury identified. Bladder is unremarkable. Stomach/Bowel: Stomach, large and small bowel grossly unremarkable. Vascular/Lymphatic: Diffuse aortic atherosclerosis. No evidence of aneurysm or adenopathy. Reproductive: Prior hysterectomy.  No adnexal masses. Other: No free fluid or free air. Musculoskeletal: Degenerative facet disease in the lower lumbar spine. No acute bony abnormality. IMPRESSION: No acute findings or evidence of significant traumatic injury in the chest, abdomen or pelvis. Aortic atherosclerosis, coronary artery disease. Electronically Signed   By: Rolm Baptise M.D.   On: 02/15/2023 23:16   CT CERVICAL SPINE WO CONTRAST  Result Date: 02/15/2023 CLINICAL DATA:  Polytrauma, blunt EXAM: CT CERVICAL SPINE WITHOUT CONTRAST TECHNIQUE: Multidetector CT imaging of the cervical spine was performed without intravenous contrast. Multiplanar CT image reconstructions were also generated. RADIATION DOSE REDUCTION: This exam was performed according to the departmental dose-optimization program which  includes automated exposure control, adjustment of the mA and/or kV according to patient size and/or use of iterative reconstruction technique. COMPARISON:  None Available. FINDINGS: Alignment: Normal Skull base and vertebrae: No acute fracture. No primary bone lesion or focal pathologic process. Soft tissues and spinal canal: No prevertebral fluid or swelling. No visible canal hematoma. Disc levels: Diffuse degenerative disc disease and facet disease. No disc herniation. Upper chest: No acute findings Other: None IMPRESSION: Degenerative disc and facet disease. No acute bony abnormality Electronically Signed   By: Rolm Baptise M.D.   On: 02/15/2023 23:13   CT HEAD WO CONTRAST  Result Date: 02/15/2023 CLINICAL DATA:  Head trauma, moderate-severe EXAM: CT HEAD WITHOUT CONTRAST TECHNIQUE: Contiguous axial images were obtained from the base of the skull through the vertex without intravenous contrast. RADIATION DOSE REDUCTION: This exam was performed according to the departmental dose-optimization program which includes automated exposure control, adjustment of the mA and/or kV according to patient size and/or use of iterative reconstruction technique. COMPARISON:  10/11/2020 FINDINGS: Brain: There is atrophy and chronic small vessel disease changes. No acute intracranial abnormality. Specifically, no hemorrhage, hydrocephalus, mass lesion, acute infarction, or significant intracranial injury. Vascular: No hyperdense vessel or unexpected calcification. Skull: No acute calvarial abnormality. Sinuses/Orbits: No acute findings Other: None IMPRESSION: Atrophy, chronic microvascular disease. No acute intracranial abnormality. Electronically Signed   By: Rolm Baptise M.D.   On: 02/15/2023 23:12   DG Pelvis Portable  Result Date: 02/15/2023 CLINICAL DATA:  Trauma. EXAM: PORTABLE PELVIS 1-2 VIEWS COMPARISON:  None Available. FINDINGS: There is no acute fracture or dislocation. The bones are osteopenic. Degenerative  changes of the hips and visualized lower lumbar spine. The soft tissues are unremarkable. IMPRESSION: No acute fracture or dislocation. Electronically Signed   By: Anner Crete M.D.   On: 02/15/2023 22:32   DG Knee Left Port  Result Date: 02/15/2023 CLINICAL DATA:  Recent fall with knee pain, initial encounter EXAM: PORTABLE LEFT KNEE - 2 VIEW COMPARISON:  08/16/2017 FINDINGS: Tricompartmental degenerative changes are noted. No acute fracture or dislocation is seen. No soft tissue abnormality is noted. IMPRESSION: Degenerative change without acute abnormality. Electronically Signed   By: Inez Catalina M.D.   On: 02/15/2023 22:30   DG Chest Port 1 View  Result Date: 02/15/2023 CLINICAL DATA:  Trauma EXAM: PORTABLE CHEST 1 VIEW COMPARISON:  05/01/2022 FINDINGS: Heart and mediastinal contours are within normal limits. No focal opacities or effusions. No acute bony abnormality. IMPRESSION:  No active cardiopulmonary disease. Electronically Signed   By: Rolm Baptise M.D.   On: 02/15/2023 22:29   _______________________________________________________________________________________________________ Latest  Blood pressure 138/62, pulse 70, temperature 97.7 F (36.5 C), resp. rate 17, height '5\' 2"'$  (1.575 m), weight 75 kg, SpO2 94 %.   Vitals  labs and radiology finding personally reviewed  Review of Systems:    Pertinent positives include: ***  Constitutional:  No weight loss, night sweats, Fevers, chills, fatigue, weight loss  HEENT:  No headaches, Difficulty swallowing,Tooth/dental problems,Sore throat,  No sneezing, itching, ear ache, nasal congestion, post nasal drip,  Cardio-vascular:  No chest pain, Orthopnea, PND, anasarca, dizziness, palpitations.no Bilateral lower extremity swelling  GI:  No heartburn, indigestion, abdominal pain, nausea, vomiting, diarrhea, change in bowel habits, loss of appetite, melena, blood in stool, hematemesis Resp:  no shortness of breath at rest. No dyspnea on  exertion, No excess mucus, no productive cough, No non-productive cough, No coughing up of blood.No change in color of mucus.No wheezing. Skin:  no rash or lesions. No jaundice GU:  no dysuria, change in color of urine, no urgency or frequency. No straining to urinate.  No flank pain.  Musculoskeletal:  No joint pain or no joint swelling. No decreased range of motion. No back pain.  Psych:  No change in mood or affect. No depression or anxiety. No memory loss.  Neuro: no localizing neurological complaints, no tingling, no weakness, no double vision, no gait abnormality, no slurred speech, no confusion  All systems reviewed and apart from [place] all are negative _______________________________________________________________________________________________ Past Medical History:   Past Medical History:  Diagnosis Date  . Cancer ([place])   . Chronic kidney disease    stage 3  . Depression   . Diabetes mellitus without complication ([place])   . Difficulty breathing   . GERD (gastroesophageal reflux disease)   . Hypertension   . IBS (irritable bowel syndrome)   . Muscle pain   . Palpitation   . PONV (postoperative nausea and vomiting)   . Swelling       Past Surgical History:  Procedure Laterality Date  . ABDOMINAL HYSTERECTOMY    . ANKLE FRACTURE SURGERY Right   . BREAST SURGERY     right  . CHOLECYSTECTOMY    . COLONOSCOPY     Dr Odie Sera in [place], > 10 yrs prior to 2021.    Marland Kitchen DECOMPRESSIVE LUMBAR LAMINECTOMY LEVEL 1 Right 10/30/2016   Procedure: RIGHT L5-S1 DECOMPRESSION, EXCISION OF CYST LEVEL 1;  Surgeon: Melina Schools, MD;  Location: [facility];  Service: Orthopedics;  Laterality: Right;  . OVARY SURGERY      Social History:  Ambulatory *** independently cane, walker  wheelchair bound, bed bound     reports that she has never smoked. She has never used smokeless tobacco. She reports that she does not drink alcohol and does not use drugs.     Family  History: *** No family history on file. ______________________________________________________________________________________________ Allergies: Allergies  Allergen Reactions  . Sulfa Antibiotics Itching and Rash  . Adhesive [Tape] Dermatitis    Blisters the skin     Prior to Admission medications   Medication Sig Start Date End Date Taking? Authorizing Provider  amLODipine (NORVASC) 5 MG tablet Take 1 tablet by mouth daily.    [provider]  aspirin 81 MG EC tablet Take 1 tablet by mouth daily.    [provider]  bupivacaine, PF, (MARCAINE) 0.25 % SOLN injection Inject into the skin. 01/24/22  [provider]  cephALEXin (KEFLEX) 500 MG capsule Take 1 capsule (500 mg total) by mouth 3 (three) times daily. 05/30/22   Marzetta Board, DPM  dexamethasone (DECADRON) 4 MG tablet Take 6 mg by mouth daily. 05/03/22   [provider]  doxazosin (CARDURA) 1 MG tablet Take 1 mg by mouth at bedtime. 08/24/20   [provider]  feeding supplement, ENSURE ENLIVE, (ENSURE ENLIVE) LIQD Take 237 mLs by mouth 2 (two) times daily between meals. 09/26/20   Regalado, Belkys A, MD  fluticasone (FLONASE) 50 MCG/ACT nasal spray Place 2 sprays into both nostrils daily as needed for allergies.  08/07/20   [provider]  furosemide (LASIX) 20 MG tablet Take 20 mg by mouth daily.    [provider]  gabapentin (NEURONTIN) 600 MG tablet Take 600 mg by mouth 2 (two) times daily.     [provider]  hydrochlorothiazide (HYDRODIURIL) 25 MG tablet Take 1 tablet by mouth daily.    [provider]  JARDIANCE 25 MG TABS tablet Take by mouth. 02/04/22   [provider]  losartan (COZAAR) 50 MG tablet Take by mouth. 05/11/21   [provider]  mirtazapine (REMERON) 15 MG tablet Take by mouth. 05/04/21   [provider]  nystatin (MYCOSTATIN) 100000 UNIT/ML suspension Take 5 mLs (500,000 Units total) by mouth 4  (four) times daily. 09/26/20   Regalado, Belkys A, MD  omeprazole (PRILOSEC) 20 MG capsule Take 40 mg by mouth daily.     [provider]  ondansetron (ZOFRAN) 4 MG tablet Take 1 tablet (4 mg total) by mouth every 6 (six) hours as needed for nausea. 09/26/20   Regalado, Belkys A, MD  pantoprazole (PROTONIX) 40 MG tablet TAKE 1 TABLET BY MOUTH ONCE DAILY, DISCONTINUE OMEPRAZOLE 04/21/21   [provider]  pioglitazone (ACTOS) 15 MG tablet Take 1 tablet by mouth daily.    [provider]  potassium chloride (MICRO-K) 10 MEQ CR capsule Take 10 mEq by mouth daily. 04/01/22   [provider]  rivaroxaban (XARELTO) 20 MG TABS tablet Take 1 tablet (20 mg total) by mouth daily with supper. 10/17/20   Regalado, Belkys A, MD  rOPINIRole (REQUIP) 2 MG tablet Take by mouth.    [provider]  rosuvastatin (CRESTOR) 5 MG tablet Take 5 mg by mouth at bedtime.     [provider]  triamcinolone acetonide (TRIESENCE) 40 MG/ML SUSP Inject into the articular space. 01/24/22   [provider]    ___________________________________________________________________________________________________ Physical Exam:    02/15/2023   10:53 PM 02/15/2023    9:54 PM 02/15/2023    9:53 PM  Vitals with BMI  Height  '5\' 2"'$    Weight  165 lbs 6 oz   BMI  [id number]   Systolic 0000000    Diastolic 62    Pulse 70 64 67     1. General:  in No ***Acute distress***increased work of breathing ***complaining of severe pain****agitated * Chronically ill *well *cachectic *toxic acutely ill -appearing 2. Psychological: Alert and *** Oriented 3. Head/ENT:   Moist *** Dry Mucous Membranes                          Head Non traumatic, neck supple                          Normal *** Poor Dentition 4. SKIN: normal *** decreased  Skin turgor,  Skin clean Dry and intact no rash 5. Heart: Regular rate and rhythm no*** Murmur, no Rub or gallop 6. Lungs: ***Clear to auscultation bilaterally, no  wheezes or crackles   7. Abdomen: Soft, ***non-tender, Non distended *** obese ***bowel sounds present 8. Lower extremities: no clubbing, cyanosis, no ***edema 9. Neurologically Grossly intact, moving all 4 extremities equally *** strength 5 out of 5 in all 4 extremities cranial nerves II through XII intact 10. MSK: Normal range of motion    Chart has been reviewed  ______________________________________________________________________________________________  Assessment/Plan  ***  Admitted for *** There are no diagnoses linked to this encounter.   Present on Admission: **None**     No problem-specific Assessment & Plan notes found for this encounter.    Other plan as per orders.  DVT prophylaxis:  SCD *** Lovenox       Code Status:    Code Status: Prior FULL CODE *** DNR/DNI ***comfort care as per patient ***family  I had personally discussed CODE STATUS with patient and family* I had spent *min discussing goals of care and CODE STATUS    Family Communication:   Family not at  Bedside  plan of care was discussed on the phone with *** Son, Daughter, Wife, Husband, Sister, Brother , father, mother  Disposition Plan:   *** likely will need placement for rehabilitation                          Back to current facility when stable                            To home once workup is complete and patient is stable  ***Following barriers for discharge:                            Electrolytes corrected                               Anemia corrected                             Pain controlled with PO medications                               Afebrile, white count improving able to transition to PO antibiotics                             Will need to be able to tolerate PO                            Will likely need home health, home O2, set up                           Will need consultants to evaluate patient prior to discharge  ****EXPECT DC tomorrow                     ***Would benefit from PT/OT eval prior to DC  Ordered  Swallow eval - SLP ordered                   Diabetes care coordinator                   Transition of care consulted                   Nutrition    consulted                  Wound care  consulted                   Palliative care    consulted                   Behavioral health  consulted                    Consults called: ***    Admission status:  ED Disposition     None        Obs***  ***  inpatient     I Expect 2 midnight stay secondary to severity of patient's current illness need for inpatient interventions justified by the following: ***hemodynamic instability despite optimal treatment (tachycardia *hypotension * tachypnea *hypoxia, hypercapnia) * Severe lab/radiological/exam abnormalities including:     and extensive comorbidities including: *substance abuse  *Chronic pain *DM2  * CHF * CAD  * COPD/asthma *Morbid Obesity * CKD *dementia *liver disease *history of stroke with residual deficits *  malignancy, * sickle cell disease  History of amputation Chronic anticoagulation  That are currently affecting medical management.   I expect  patient to be hospitalized for 2 midnights requiring inpatient medical care.  Patient is at high risk for adverse outcome (such as loss of life or disability) if not treated.  Indication for inpatient stay as follows:  Severe change from baseline regarding mental status Hemodynamic instability despite maximal medical therapy,  ongoing suicidal ideations,  severe pain requiring acute inpatient management,  inability to maintain oral hydration   persistent chest pain despite medical management Need for operative/procedural  intervention New or worsening hypoxia   Need for IV antibiotics, IV fluids, IV rate controling medications, IV antihypertensives, IV pain medications, IV anticoagulation, need for biPAP    Level of care   *** tele  For  12H 24H     medical floor       progressive tele indefinitely please discontinue once patient no longer qualifies COVID-19 Labs    Lab Results  Component Value Date   Enigma NEGATIVE 09/26/2020     Precautions: admitted as *** Covid Negative  ***asymptomatic screening protocol****PUI *** covid positive No active isolations ***If Covid PCR is negative  - please DC precautions - would need additional investigation given very high risk for false native test result    Critical***  Patient is critically ill due to  hemodynamic instability * respiratory failure *severe sepsis* ongoing chest pain*  They are at high risk for life/limb threatening clinical deterioration requiring frequent reassessment and modifications of care.  Services provided include examination of the patient, review of relevant ancillary tests, prescription of lifesaving therapies, review of medications and prophylactic therapy.  Total critical care time excluding separately billable procedures: 60*  Minutes.    Skyley Grandmaison 02/15/2023, 11:30 PM ***  [facility]     after 2 AM please page floor coverage PA If 7AM-7PM, please contact the day team taking care of the patient  using [email or web address]   Patient was evaluated in the context of the global COVID-19 pandemic, which necessitated consideration that the patient might be at risk for infection with the SARS-CoV-2 virus that causes COVID-19. Institutional protocols and algorithms that pertain to the evaluation of patients at risk for COVID-19 are in a state of rapid change based on information released by regulatory bodies including the CDC and federal and state organizations. These policies and algorithms were followed during the patient's care.

## 2023-02-15 NOTE — Subjective & Objective (Signed)
Patient had a syncopal episode at home with a fall she has been feeling weak for the past 1 week with nausea and vomiting and cough Started recently on antibiotics when she was walking to the bathroom using a walker she fell down hitting her head and left knee Reports she hit the back of her head against the floor no chest pain or shortness of breath associated this Patient is on Xarelto history of DVT and cancer

## 2023-02-15 NOTE — ED Provider Notes (Signed)
[facility] DEPARTMENT AT [facility] Provider Note   CSN: [id number] Arrival date & time: 02/15/23  2139     History  Chief Complaint  Patient presents with   Fall   Loss of Consciousness    Anna Stone is a 82 y.o. female.  The history is provided by the patient, the EMS personnel and medical records. No language interpreter was used.  Fall  Loss of Consciousness    82 year old female significant history of diabetes, hypertension, history of DVT currently on Xarelto, history of cancer brought here via EMS as a level 2 trauma due to fall on blood thinner.  History obtained through patient and through EMS.  For the past 3 days patient has had nausea vomiting diarrhea as well as flulike symptoms.  States she was giving antibiotic for respiratory infection for which she has been taking for the past 2 days.  She endorsed feeling very weak today and while walking around the house she fell to the ground had a syncopal episode hitting the back of her head against the floor and her walker fall on top of her.  She was needing assistance to get up.  She endorsed pain to the back of her head as well as pain to her left knee.  She did felt weak prior to syncope.  She denies any chest pain or shortness of breath no abdominal pain no urinary symptoms.  Home Medications Prior to Admission medications   Medication Sig Start Date End Date Taking? Authorizing Provider  amLODipine (NORVASC) 5 MG tablet Take 1 tablet by mouth daily.    [provider]  aspirin 81 MG EC tablet Take 1 tablet by mouth daily.    [provider]  bupivacaine, PF, (MARCAINE) 0.25 % SOLN injection Inject into the skin. 01/24/22   [provider]  cephALEXin (KEFLEX) 500 MG capsule Take 1 capsule (500 mg total) by mouth 3 (three) times daily. 05/30/22   Freddie Breech, DPM  dexamethasone (DECADRON) 4 MG tablet Take 6 mg by mouth daily. 05/03/22   [provider]  doxazosin (CARDURA) 1 MG tablet Take 1 mg by mouth at bedtime. 08/24/20   [provider]  feeding supplement, ENSURE ENLIVE, (ENSURE ENLIVE) LIQD Take 237 mLs by mouth 2 (two) times daily between meals. 09/26/20   Regalado, Belkys A, MD  fluticasone (FLONASE) 50 MCG/ACT nasal spray Place 2 sprays into both nostrils daily as needed for allergies.  08/07/20   [provider]  furosemide (LASIX) 20 MG tablet Take 20 mg by mouth daily.    [provider]  gabapentin (NEURONTIN) 600 MG tablet Take 600 mg by mouth 2 (two) times daily.     [provider]  hydrochlorothiazide (HYDRODIURIL) 25 MG tablet Take 1 tablet by mouth daily.    [provider]  JARDIANCE 25 MG TABS tablet Take by mouth. 02/04/22   [provider]  losartan (COZAAR) 50 MG tablet Take by mouth. 05/11/21   [provider]  mirtazapine (REMERON) 15 MG tablet Take by mouth. 05/04/21   [provider]  nystatin (MYCOSTATIN) 100000 UNIT/ML suspension Take 5 mLs (500,000 Units total) by mouth 4 (four) times daily. 09/26/20   Regalado, Belkys A, MD  omeprazole (PRILOSEC) 20 MG capsule Take 40 mg by mouth daily.     [provider]  ondansetron (ZOFRAN) 4 MG tablet Take 1 tablet (4 mg total) by mouth every 6 (six) hours as needed for  nausea. 09/26/20   Regalado, Belkys A, MD  pantoprazole (PROTONIX) 40 MG tablet TAKE 1 TABLET BY MOUTH ONCE DAILY, DISCONTINUE OMEPRAZOLE 04/21/21   [provider]  pioglitazone (ACTOS) 15 MG tablet Take 1 tablet by mouth daily.    [provider]  potassium chloride (MICRO-K) 10 MEQ CR capsule Take 10 mEq by mouth daily. 04/01/22   [provider]  rivaroxaban (XARELTO) 20 MG TABS tablet Take 1 tablet (20 mg total) by mouth daily with supper. 10/17/20   Regalado, Belkys A, MD  rOPINIRole (REQUIP) 2 MG tablet Take by mouth.    [provider]  rosuvastatin (CRESTOR) 5 MG tablet Take 5 mg  by mouth at bedtime.     [provider]  triamcinolone acetonide (TRIESENCE) 40 MG/ML SUSP Inject into the articular space. 01/24/22   [provider]      Allergies    Sulfa antibiotics and Adhesive [tape]    Review of Systems   Review of Systems  Cardiovascular:  Positive for syncope.  All other systems reviewed and are negative.   Physical Exam Updated Vital Signs BP 130/76 Comment: Manual  Temp 97.7 F (36.5 C)   Ht 5\' 2"  (1.575 m)   Wt 75 kg   BMI 30.24 kg/m  Physical Exam Vitals and nursing note reviewed.  Constitutional:      General: She is not in acute distress.    Appearance: She is well-developed. She is obese.  HENT:     Head: Normocephalic and atraumatic.     Comments: Tenderness to occipital scalp without any bruising crepitus or swelling noted. Eyes:     Conjunctiva/sclera: Conjunctivae normal.  Neck:     Comments: No midline spine tenderness. Cardiovascular:     Rate and Rhythm: Normal rate and regular rhythm.     Pulses: Normal pulses.     Heart sounds: Normal heart sounds.  Pulmonary:     Effort: Pulmonary effort is normal.     Breath sounds: No wheezing, rhonchi or rales.  Chest:     Chest wall: No tenderness.  Abdominal:     Palpations: Abdomen is soft.     Tenderness: There is no abdominal tenderness.  Musculoskeletal:        General: Tenderness (Left knee with abrasion to anterior knee.  Mildly tender but normal flexion/extension) present.     Cervical back: Normal range of motion and neck supple. No tenderness.  Skin:    Findings: No rash.  Neurological:     Mental Status: She is alert and oriented to person, place, and time.  Psychiatric:        Mood and Affect: Mood normal.     ED Results / Procedures / Treatments   Labs (all labs ordered are listed, but only abnormal results are displayed) Labs Reviewed  COMPREHENSIVE METABOLIC PANEL - Abnormal; Notable for the following components:      Result Value    Potassium 2.8 (*)    Glucose, Bld 117 (*)    BUN 24 (*)    Creatinine, Ser 1.44 (*)    Calcium 8.8 (*)    GFR, Estimated 36 (*)    All other components within normal limits  CBC - Abnormal; Notable for the following components:   WBC 3.8 (*)    Platelets 121 (*)    All other components within normal limits  PROTIME-INR - Abnormal; Notable for the following components:   Prothrombin Time 15.4 (*)    All other components within  normal limits  I-STAT CHEM 8, ED - Abnormal; Notable for the following components:   Potassium 2.9 (*)    BUN 25 (*)    Creatinine, Ser 1.50 (*)    Glucose, Bld 112 (*)    Calcium, Ion 1.10 (*)    All other components within normal limits  CBG MONITORING, ED - Abnormal; Notable for the following components:   Glucose-Capillary 112 (*)    All other components within normal limits  RESP PANEL BY RT-PCR (RSV, FLU A&B, COVID)  [id number]  ETHANOL  LACTIC ACID, PLASMA  CDS SEROLOGY  URINALYSIS, ROUTINE W REFLEX MICROSCOPIC  MAGNESIUM  SAMPLE TO BLOOD BANK    EKG None  Date: 02/15/2023  Rate: 67  Rhythm: normal sinus rhythm  QRS Axis: normal  Intervals: normal  ST/T Wave abnormalities: normal  Conduction Disutrbances: none  Narrative Interpretation:   Old EKG Reviewed: No significant changes noted    Radiology CT T-SPINE NO CHARGE  Result Date: 02/15/2023 CLINICAL DATA:  Trauma EXAM: CT THORACIC SPINE WITHOUT CONTRAST TECHNIQUE: Multidetector CT images of the thoracic were obtained using the standard protocol without intravenous contrast. RADIATION DOSE REDUCTION: This exam was performed according to the departmental dose-optimization program which includes automated exposure control, adjustment of the mA and/or kV according to patient size and/or use of iterative reconstruction technique. COMPARISON:  None Available. FINDINGS: Alignment: Normal. Vertebrae: No acute fracture or focal pathologic process. Paraspinal and other soft tissues: Negative. Disc  levels: Early anterior spurring in the mid to lower thoracic spine. No disc herniation. IMPRESSION: No acute bony abnormality. Electronically Signed   By: Charlett Nose M.D.   On: 02/15/2023 23:19   CT L-SPINE NO CHARGE  Result Date: 02/15/2023 CLINICAL DATA:  Trauma EXAM: CT LUMBAR SPINE WITHOUT CONTRAST TECHNIQUE: Multidetector CT imaging of the lumbar spine was performed without intravenous contrast administration. Multiplanar CT image reconstructions were also generated. RADIATION DOSE REDUCTION: This exam was performed according to the departmental dose-optimization program which includes automated exposure control, adjustment of the mA and/or kV according to patient size and/or use of iterative reconstruction technique. COMPARISON:  None Available. FINDINGS: Segmentation: 5 lumbar type vertebrae. Alignment: Slight anterolisthesis of L4 on L5 and L5 on S1 related to facet disease. Vertebrae: No acute fracture or focal pathologic process. Paraspinal and other soft tissues: Negative. Disc levels: No visible disc herniation. Disc spaces maintained. Degenerative facet disease in the lower lumbar spine. IMPRESSION: No acute bony abnormality. Electronically Signed   By: Charlett Nose M.D.   On: 02/15/2023 23:18   CT CHEST ABDOMEN PELVIS WO CONTRAST  Result Date: 02/15/2023 CLINICAL DATA:  Polytrauma, blunt EXAM: CT CHEST, ABDOMEN AND PELVIS WITHOUT CONTRAST TECHNIQUE: Multidetector CT imaging of the chest, abdomen and pelvis was performed following the standard protocol without IV contrast. RADIATION DOSE REDUCTION: This exam was performed according to the departmental dose-optimization program which includes automated exposure control, adjustment of the mA and/or kV according to patient size and/or use of iterative reconstruction technique. COMPARISON:  None Available. FINDINGS: CT CHEST FINDINGS Cardiovascular: Heart is normal size. Moderate coronary artery and aortic calcifications. No aneurysm.  Mediastinum/Nodes: No mediastinal, hilar, or axillary adenopathy. Trachea and esophagus are unremarkable. Thyroid unremarkable. Lungs/Pleura: No confluent opacities, effusions or pneumothorax. Musculoskeletal: Chest wall soft tissues are unremarkable. No acute bony abnormality. CT ABDOMEN PELVIS FINDINGS Hepatobiliary: 2 cm low-density lesion in the left hepatic lobe, likely cyst. 3 cm exophytic low-density lesion in the inferior right hepatic lobe, also likely cysts. Prior cholecystectomy. No  evidence of a hepatic injury or perihepatic hematoma. Pancreas: No focal abnormality or ductal dilatation. Spleen: No splenic injury or perisplenic hematoma. Adrenals/Urinary Tract: No adrenal hemorrhage or renal injury identified. Bladder is unremarkable. Stomach/Bowel: Stomach, large and small bowel grossly unremarkable. Vascular/Lymphatic: Diffuse aortic atherosclerosis. No evidence of aneurysm or adenopathy. Reproductive: Prior hysterectomy.  No adnexal masses. Other: No free fluid or free air. Musculoskeletal: Degenerative facet disease in the lower lumbar spine. No acute bony abnormality. IMPRESSION: No acute findings or evidence of significant traumatic injury in the chest, abdomen or pelvis. Aortic atherosclerosis, coronary artery disease. Electronically Signed   By: Charlett Nose M.D.   On: 02/15/2023 23:16   CT CERVICAL SPINE WO CONTRAST  Result Date: 02/15/2023 CLINICAL DATA:  Polytrauma, blunt EXAM: CT CERVICAL SPINE WITHOUT CONTRAST TECHNIQUE: Multidetector CT imaging of the cervical spine was performed without intravenous contrast. Multiplanar CT image reconstructions were also generated. RADIATION DOSE REDUCTION: This exam was performed according to the departmental dose-optimization program which includes automated exposure control, adjustment of the mA and/or kV according to patient size and/or use of iterative reconstruction technique. COMPARISON:  None Available. FINDINGS: Alignment: Normal Skull base and  vertebrae: No acute fracture. No primary bone lesion or focal pathologic process. Soft tissues and spinal canal: No prevertebral fluid or swelling. No visible canal hematoma. Disc levels: Diffuse degenerative disc disease and facet disease. No disc herniation. Upper chest: No acute findings Other: None IMPRESSION: Degenerative disc and facet disease. No acute bony abnormality Electronically Signed   By: Charlett Nose M.D.   On: 02/15/2023 23:13   CT HEAD WO CONTRAST  Result Date: 02/15/2023 CLINICAL DATA:  Head trauma, moderate-severe EXAM: CT HEAD WITHOUT CONTRAST TECHNIQUE: Contiguous axial images were obtained from the base of the skull through the vertex without intravenous contrast. RADIATION DOSE REDUCTION: This exam was performed according to the departmental dose-optimization program which includes automated exposure control, adjustment of the mA and/or kV according to patient size and/or use of iterative reconstruction technique. COMPARISON:  10/11/2020 FINDINGS: Brain: There is atrophy and chronic small vessel disease changes. No acute intracranial abnormality. Specifically, no hemorrhage, hydrocephalus, mass lesion, acute infarction, or significant intracranial injury. Vascular: No hyperdense vessel or unexpected calcification. Skull: No acute calvarial abnormality. Sinuses/Orbits: No acute findings Other: None IMPRESSION: Atrophy, chronic microvascular disease. No acute intracranial abnormality. Electronically Signed   By: Charlett Nose M.D.   On: 02/15/2023 23:12   DG Pelvis Portable  Result Date: 02/15/2023 CLINICAL DATA:  Trauma. EXAM: PORTABLE PELVIS 1-2 VIEWS COMPARISON:  None Available. FINDINGS: There is no acute fracture or dislocation. The bones are osteopenic. Degenerative changes of the hips and visualized lower lumbar spine. The soft tissues are unremarkable. IMPRESSION: No acute fracture or dislocation. Electronically Signed   By: Elgie Collard M.D.   On: 02/15/2023 22:32   DG Knee  Left Port  Result Date: 02/15/2023 CLINICAL DATA:  Recent fall with knee pain, initial encounter EXAM: PORTABLE LEFT KNEE - 2 VIEW COMPARISON:  08/16/2017 FINDINGS: Tricompartmental degenerative changes are noted. No acute fracture or dislocation is seen. No soft tissue abnormality is noted. IMPRESSION: Degenerative change without acute abnormality. Electronically Signed   By: Alcide Clever M.D.   On: 02/15/2023 22:30   DG Chest Port 1 View  Result Date: 02/15/2023 CLINICAL DATA:  Trauma EXAM: PORTABLE CHEST 1 VIEW COMPARISON:  05/01/2022 FINDINGS: Heart and mediastinal contours are within normal limits. No focal opacities or effusions. No acute bony abnormality. IMPRESSION: No active cardiopulmonary disease. Electronically  Signed   By: Charlett Nose M.D.   On: 02/15/2023 22:29    Procedures Procedures    Medications Ordered in ED Medications  sodium chloride 0.9 % bolus 1,000 mL (1,000 mLs Intravenous New Bag/Given 02/15/23 2207)    And  0.9 %  sodium chloride infusion ( Intravenous New Bag/Given 02/15/23 2246)  potassium chloride SA (KLOR-CON M) CR tablet 40 mEq (40 mEq Oral Not Given 02/15/23 2235)  potassium chloride 10 mEq in 100 mL IVPB (10 mEq Intravenous New Bag/Given 02/15/23 2246)  ondansetron (ZOFRAN) injection 4 mg (4 mg Intravenous Given 02/15/23 2207)    ED Course/ Medical Decision Making/ A&P                             Medical Decision Making Amount and/or Complexity of Data Reviewed Labs: ordered. Radiology: ordered.  Risk Prescription drug management.   BP 130/76 Comment: Manual  Pulse 64   Temp 97.7 F (36.5 C)   Resp 14   Ht 5\' 2"  (1.575 m)   Wt 75 kg   SpO2 99%   BMI 30.24 kg/m   73:70 PM  82 year old female significant history of diabetes, hypertension, history of DVT currently on Xarelto, history of cancer brought here via EMS as a level 2 trauma due to fall on blood thinner.  History obtained through patient and through EMS.  For the past 3 days patient has  had nausea vomiting diarrhea as well as flulike symptoms.  States she was giving antibiotic for respiratory infection for which she has been taking for the past 2 days.  She endorsed feeling very weak today and while walking around the house she fell to the ground had a syncopal episode hitting the back of her head against the floor and her walker fall on top of her.  She was needing assistance to get up.  She endorsed pain to the back of her head as well as pain to her left knee.  She did felt weak prior to syncope.  She denies any chest pain or shortness of breath no abdominal pain no urinary symptoms.  On exam, patient is laying bed appears slightly uncomfortable but nontoxic in appearance.  Head with some mild tenderness to her occipital scalp without bruising.  She also has some abrasion to her left knee with normal flexion and extension.  She does not have any significant chest abdomen pelvis tenderness and no significant midline spine tenderness.  She is mentating appropriately and is alert and oriented x 4.  She is globally weak but able to move all 4 extremities.  Vital signs reviewed and overall reassuring.  Patient mention she was started on antibiotic for a respiratory infection a few days ago.  She has been on antibiotic for the past 2 days.  She is unable to recall which antibiotic it is.  She also has nausea vomit diarrhea during this episode.  I do have low suspicion for C. difficile causing her diarrhea.  -Labs ordered, independently viewed and interpreted by me.  Labs remarkable for positive influenza A.  K+ 2.9, supplementation given.  Cr 1.44 which is worse than baseline and reflect AKI.  IVF given. Flu positive -The patient was maintained on a cardiac monitor.  I personally viewed and interpreted the cardiac monitored which showed an underlying rhythm of: NSR -Imaging independently viewed and interpreted by me and I agree with radiologist's interpretation.  Result remarkable for CT of  head/cspine/chest/abd/pelvis  without acute changes, xray L knee normal -This patient presents to the ED for concern of weakness, this involves an extensive number of treatment options, and is a complaint that carries with it a high risk of complications and morbidity.  The differential diagnosis includes dehydration, covid, flu, rsv, cardiac arrhythmia, anemia, acs -Co morbidities that complicate the patient evaluation includes DM, GERD, CA -Treatment includes IVF, antiemetic -Reevaluation of the patient after these medicines showed that the patient improved -PCP office notes or outside notes reviewed -Discussion with specialist [facility] DR. Doutova who agrees to admit pt -Escalation to admission/observation considered: patient is agreeable with admission for management of flu and dehydration.           Final Clinical Impression(s) / ED Diagnoses Final diagnoses:  Syncope and collapse  Dehydration  Influenza A    Rx / DC Orders ED Discharge Orders     None         Fayrene Helper, PA-C 02/15/23 2352    Gloris Manchester, MD 02/16/23 [phone]

## 2023-02-15 NOTE — H&P (Addendum)
Anna Stone [id number] DOB: 1941/12/08 DOA: 02/15/2023     PCP: Marco Collie, MD      Patient arrived to ER on 02/15/23 at 2139 Referred by Attending Godfrey Pick, MD   Patient coming from:    home Lives  With family    Chief Complaint:   Chief Complaint  Patient presents with   Fall   Loss of Consciousness    HPI: Anna Stone is a 82 y.o. female with medical history significant of history of DVT on Xarelto, diabetes, hypertension, CKD, HLD    Presented with   fall Patient had a syncopal episode at home with a fall she has been feeling weak for the past 1 week with nausea and vomiting and cough Started recently on antibiotics when she was walking to the bathroom using a walker she fell down hitting her head and left knee Reports she hit the back of her head against the floor no chest pain or shortness of breath associated this Patient is on Xarelto history of DVT and cancer   Had some cold symptoms and that is why she ws given abx The NVD was even before than Diarrhea now improved after taking immodium  Does not smoke or drink   Regarding pertinent Chronic problems:     Hyperlipidemia - on statins crestor Lipid Panel  No results found for: "CHOL", "TRIG", "HDL", "CHOLHDL", "VLDL", "LDLCALC", "LDLDIRECT", "LABVLDL"   HTN on NOrvasc, cardura, HCTZ      DM 2 -  Lab Results  Component Value Date   HGBA1C 7.2 (H) 09/01/2020   PO meds only,     obesity-   BMI Readings from Last 1 Encounters:  02/15/23 30.24 kg/m      OSA -on nocturnal oxygen,       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     While in ER:   Found to have AKI and potassium down to 2.8   Ordered  CT HEAD neck   NON acute CT lumbar and thoracic spine unremarkable  CXR neg Left knee and pelvis unremarkable    CT chest -  nonacute,  evidence of infiltrate  Following Medications were ordered in ER: Medications  sodium chloride 0.9 % bolus  1,000 mL (1,000 mLs Intravenous New Bag/Given 02/15/23 2207)    And  0.9 %  sodium chloride infusion ( Intravenous New Bag/Given 02/15/23 2246)  potassium chloride SA (KLOR-CON M) CR tablet 40 mEq (40 mEq Oral Not Given 02/15/23 2235)  potassium chloride 10 mEq in 100 mL IVPB (10 mEq Intravenous New Bag/Given 02/15/23 2246)  ondansetron (ZOFRAN) injection 4 mg (4 mg Intravenous Given 02/15/23 2207)       ED Triage Vitals  Enc Vitals Group     BP 02/15/23 2150 130/76     Pulse Rate 02/15/23 2153 67     Resp 02/15/23 2153 14     Temp 02/15/23 2153 97.7 F (36.5 C)     Temp src --      SpO2 02/15/23 2153 99 %     Weight 02/15/23 2154 165 lb 5.5 oz (75 kg)     Height 02/15/23 2154 '5\' 2"'$  (1.575 m)     Head Circumference --      Peak Flow --      Pain Score --      Pain Loc --      Pain Edu? --      Excl. in [place]? --  [id number]     _________________________________________ Significant initial  Findings: Abnormal Labs Reviewed  COMPREHENSIVE METABOLIC PANEL - Abnormal; Notable for the following components:      Result Value   Potassium 2.8 (*)    Glucose, Bld 117 (*)    BUN 24 (*)    Creatinine, Ser 1.44 (*)    Calcium 8.8 (*)    GFR, Estimated 36 (*)    All other components within normal limits  CBC - Abnormal; Notable for the following components:   WBC 3.8 (*)    Platelets 121 (*)    All other components within normal limits  PROTIME-INR - Abnormal; Notable for the following components:   Prothrombin Time 15.4 (*)    All other components within normal limits  I-STAT CHEM 8, ED - Abnormal; Notable for the following components:   Potassium 2.9 (*)    BUN 25 (*)    Creatinine, Ser 1.50 (*)    Glucose, Bld 112 (*)    Calcium, Ion 1.10 (*)    All other components within normal limits  CBG MONITORING, ED - Abnormal; Notable for the following components:   Glucose-Capillary 112 (*)    All other components within normal limits     _________________________ Troponin  ordered ECG:  Ordered Personally reviewed and interpreted by me showing: HR : 67 Rhythm: Sinus rhythm Low voltage, precordial leads Abnormal R-wave progression, early transition     The recent clinical data is shown below. Vitals:   02/15/23 2153 02/15/23 2154 02/15/23 2154 02/15/23 2253  BP:    138/62  Pulse:  64  70  Resp:  14  17  Temp: 97.7 F (36.5 C)     SpO2:  99%  94%  Weight:   75 kg   Height:   '5\' 2"'$  (1.575 m)     WBC     Component Value Date/Time   WBC 3.8 (L) 02/15/2023 2200   LYMPHSABS 0.5 (L) 09/03/2020 0102   MONOABS 0.3 09/03/2020 0102   EOSABS 0.1 09/03/2020 0102   BASOSABS 0.0 09/03/2020 0102    Lactic Acid, Venous    Component Value Date/Time   LATICACIDVEN 1.5 02/15/2023 2200      UA  ordered      Results for orders placed or performed during the hospital encounter of 02/15/23  Resp panel by RT-PCR (RSV, Flu A&B, Covid) Anterior Nasal Swab     Status: Abnormal   Collection Time: 02/15/23 10:39 PM   Specimen: Anterior Nasal Swab  Result Value Ref Range Status   SARS Coronavirus 2 by RT PCR NEGATIVE NEGATIVE Final   Influenza A by PCR POSITIVE (A) NEGATIVE Final   Influenza B by PCR NEGATIVE NEGATIVE Final         Resp Syncytial Virus by PCR NEGATIVE NEGATIVE Final          _______________________________________________ Hospitalist was called for admission for syncope hypokalemia  The following Work up has been ordered so far:  Orders Placed This Encounter  Procedures   Resp panel by RT-PCR (RSV, Flu A&B, Covid) Anterior Nasal Swab   DG Chest Port 1 View   DG Pelvis Portable   CT HEAD WO CONTRAST   CT CERVICAL SPINE WO CONTRAST   DG Knee Left Port   CT CHEST ABDOMEN PELVIS WO CONTRAST   CT L-SPINE NO CHARGE   CT T-SPINE NO CHARGE   Comprehensive metabolic panel   CBC   Ethanol   Urinalysis, Routine w reflex microscopic -Urine,  Clean Catch   Lactic acid, plasma   Protime-INR   CDS serology   Magnesium   Diet NPO time specified   Cardiac  monitoring   Measure blood pressure   Initiate Carrier Fluid Protocol   Consult to hospitalist   Pulse oximetry, continuous   I-Stat Chem 8, ED   CBG monitoring, ED   EKG 12-Lead   Sample to Blood Bank     OTHER Significant initial  Findings:  labs showing:    Recent Labs  Lab 02/15/23 2200 02/15/23 2213  NA 140 140  K 2.8* 2.9*  CO2 24  --   GLUCOSE 117* 112*  BUN 24* 25*  CREATININE 1.44* 1.50*  CALCIUM 8.8*  --     Cr   Up from baseline see below Lab Results  Component Value Date   CREATININE 1.50 (H) 02/15/2023   CREATININE 1.44 (H) 02/15/2023   CREATININE 0.92 09/25/2020    Recent Labs  Lab 02/15/23 2200  AST 29  ALT 14  ALKPHOS 61  BILITOT 0.7  PROT 7.2  ALBUMIN 3.7   Lab Results  Component Value Date   CALCIUM 8.8 (L) 02/15/2023    Plt: Lab Results  Component Value Date   PLT 121 (L) 02/15/2023    COVID-19 Labs  No results for input(s): "DDIMER", "FERRITIN", "LDH", "CRP" in the last 72 hours.  Lab Results  Component Value Date   SARSCOV2NAA NEGATIVE 09/26/2020   [place] NEGATIVE 09/17/2020   [place] NEGATIVE 08/31/2020        Recent Labs  Lab 02/15/23 2200 02/15/23 2213  WBC 3.8*  --   HGB 13.6 14.3  HCT 42.2 42.0  MCV 84.6  --   PLT 121*  --     HG/HCT  stable,     Component Value Date/Time   HGB 14.3 02/15/2023 2213   HCT 42.0 02/15/2023 2213   MCV 84.6 02/15/2023 2200     Cardiac Panel (last 3 results) No results for input(s): "CKTOTAL", "CKMB", "TROPONINI", "RELINDX" in the last 72 hours.     DM  labs:  HbA1C: No results for input(s): "HGBA1C" in the last 8760 hours.     CBG (last 3)  Recent Labs    02/15/23 2159  GLUCAP 112*    Cultures:    Component Value Date/Time   SDES BLOOD LEFT ANTECUBITAL 09/22/2020 1357   SPECREQUEST  09/22/2020 1357    BOTTLES DRAWN AEROBIC AND ANAEROBIC Blood Culture adequate volume   CULT  09/22/2020 1357    NO GROWTH 5 DAYS Performed at [facility],  [place] [address] [place], [place] [address]    REPTSTATUS 09/27/2020 FINAL 09/22/2020 1357     Radiological Exams on Admission: CT T-SPINE NO CHARGE  Result Date: 02/15/2023 CLINICAL DATA:  Trauma EXAM: CT THORACIC SPINE WITHOUT CONTRAST TECHNIQUE: Multidetector CT images of the thoracic were obtained using the standard protocol without intravenous contrast. RADIATION DOSE REDUCTION: This exam was performed according to the departmental dose-optimization program which includes automated exposure control, adjustment of the mA and/or kV according to patient size and/or use of iterative reconstruction technique. COMPARISON:  None Available. FINDINGS: Alignment: Normal. Vertebrae: No acute fracture or focal pathologic process. Paraspinal and other soft tissues: Negative. Disc levels: Early anterior spurring in the mid to lower thoracic spine. No disc herniation. IMPRESSION: No acute bony abnormality. Electronically Signed   By: Rolm Baptise M.D.   On: 02/15/2023 23:19   CT L-SPINE NO CHARGE  Result Date: 02/15/2023  CLINICAL DATA:  Trauma EXAM: CT LUMBAR SPINE WITHOUT CONTRAST TECHNIQUE: Multidetector CT imaging of the lumbar spine was performed without intravenous contrast administration. Multiplanar CT image reconstructions were also generated. RADIATION DOSE REDUCTION: This exam was performed according to the departmental dose-optimization program which includes automated exposure control, adjustment of the mA and/or kV according to patient size and/or use of iterative reconstruction technique. COMPARISON:  None Available. FINDINGS: Segmentation: 5 lumbar type vertebrae. Alignment: Slight anterolisthesis of L4 on L5 and L5 on S1 related to facet disease. Vertebrae: No acute fracture or focal pathologic process. Paraspinal and other soft tissues: Negative. Disc levels: No visible disc herniation. Disc spaces maintained. Degenerative facet disease in the lower lumbar spine. IMPRESSION: No acute bony abnormality.  Electronically Signed   By: Rolm Baptise M.D.   On: 02/15/2023 23:18   CT CHEST ABDOMEN PELVIS WO CONTRAST  Result Date: 02/15/2023 CLINICAL DATA:  Polytrauma, blunt EXAM: CT CHEST, ABDOMEN AND PELVIS WITHOUT CONTRAST TECHNIQUE: Multidetector CT imaging of the chest, abdomen and pelvis was performed following the standard protocol without IV contrast. RADIATION DOSE REDUCTION: This exam was performed according to the departmental dose-optimization program which includes automated exposure control, adjustment of the mA and/or kV according to patient size and/or use of iterative reconstruction technique. COMPARISON:  None Available. FINDINGS: CT CHEST FINDINGS Cardiovascular: Heart is normal size. Moderate coronary artery and aortic calcifications. No aneurysm. Mediastinum/Nodes: No mediastinal, hilar, or axillary adenopathy. Trachea and esophagus are unremarkable. Thyroid unremarkable. Lungs/Pleura: No confluent opacities, effusions or pneumothorax. Musculoskeletal: Chest wall soft tissues are unremarkable. No acute bony abnormality. CT ABDOMEN PELVIS FINDINGS Hepatobiliary: 2 cm low-density lesion in the left hepatic lobe, likely cyst. 3 cm exophytic low-density lesion in the inferior right hepatic lobe, also likely cysts. Prior cholecystectomy. No evidence of a hepatic injury or perihepatic hematoma. Pancreas: No focal abnormality or ductal dilatation. Spleen: No splenic injury or perisplenic hematoma. Adrenals/Urinary Tract: No adrenal hemorrhage or renal injury identified. Bladder is unremarkable. Stomach/Bowel: Stomach, large and small bowel grossly unremarkable. Vascular/Lymphatic: Diffuse aortic atherosclerosis. No evidence of aneurysm or adenopathy. Reproductive: Prior hysterectomy.  No adnexal masses. Other: No free fluid or free air. Musculoskeletal: Degenerative facet disease in the lower lumbar spine. No acute bony abnormality. IMPRESSION: No acute findings or evidence of significant traumatic injury  in the chest, abdomen or pelvis. Aortic atherosclerosis, coronary artery disease. Electronically Signed   By: Rolm Baptise M.D.   On: 02/15/2023 23:16   CT CERVICAL SPINE WO CONTRAST  Result Date: 02/15/2023 CLINICAL DATA:  Polytrauma, blunt EXAM: CT CERVICAL SPINE WITHOUT CONTRAST TECHNIQUE: Multidetector CT imaging of the cervical spine was performed without intravenous contrast. Multiplanar CT image reconstructions were also generated. RADIATION DOSE REDUCTION: This exam was performed according to the departmental dose-optimization program which includes automated exposure control, adjustment of the mA and/or kV according to patient size and/or use of iterative reconstruction technique. COMPARISON:  None Available. FINDINGS: Alignment: Normal Skull base and vertebrae: No acute fracture. No primary bone lesion or focal pathologic process. Soft tissues and spinal canal: No prevertebral fluid or swelling. No visible canal hematoma. Disc levels: Diffuse degenerative disc disease and facet disease. No disc herniation. Upper chest: No acute findings Other: None IMPRESSION: Degenerative disc and facet disease. No acute bony abnormality Electronically Signed   By: Rolm Baptise M.D.   On: 02/15/2023 23:13   CT HEAD WO CONTRAST  Result Date: 02/15/2023 CLINICAL DATA:  Head trauma, moderate-severe EXAM: CT HEAD WITHOUT CONTRAST TECHNIQUE: Contiguous axial images  were obtained from the base of the skull through the vertex without intravenous contrast. RADIATION DOSE REDUCTION: This exam was performed according to the departmental dose-optimization program which includes automated exposure control, adjustment of the mA and/or kV according to patient size and/or use of iterative reconstruction technique. COMPARISON:  10/11/2020 FINDINGS: Brain: There is atrophy and chronic small vessel disease changes. No acute intracranial abnormality. Specifically, no hemorrhage, hydrocephalus, mass lesion, acute infarction, or  significant intracranial injury. Vascular: No hyperdense vessel or unexpected calcification. Skull: No acute calvarial abnormality. Sinuses/Orbits: No acute findings Other: None IMPRESSION: Atrophy, chronic microvascular disease. No acute intracranial abnormality. Electronically Signed   By: Rolm Baptise M.D.   On: 02/15/2023 23:12   DG Pelvis Portable  Result Date: 02/15/2023 CLINICAL DATA:  Trauma. EXAM: PORTABLE PELVIS 1-2 VIEWS COMPARISON:  None Available. FINDINGS: There is no acute fracture or dislocation. The bones are osteopenic. Degenerative changes of the hips and visualized lower lumbar spine. The soft tissues are unremarkable. IMPRESSION: No acute fracture or dislocation. Electronically Signed   By: Anner Crete M.D.   On: 02/15/2023 22:32   DG Knee Left Port  Result Date: 02/15/2023 CLINICAL DATA:  Recent fall with knee pain, initial encounter EXAM: PORTABLE LEFT KNEE - 2 VIEW COMPARISON:  08/16/2017 FINDINGS: Tricompartmental degenerative changes are noted. No acute fracture or dislocation is seen. No soft tissue abnormality is noted. IMPRESSION: Degenerative change without acute abnormality. Electronically Signed   By: Inez Catalina M.D.   On: 02/15/2023 22:30   DG Chest Port 1 View  Result Date: 02/15/2023 CLINICAL DATA:  Trauma EXAM: PORTABLE CHEST 1 VIEW COMPARISON:  05/01/2022 FINDINGS: Heart and mediastinal contours are within normal limits. No focal opacities or effusions. No acute bony abnormality. IMPRESSION: No active cardiopulmonary disease. Electronically Signed   By: Rolm Baptise M.D.   On: 02/15/2023 22:29   _______________________________________________________________________________________________________ Latest  Blood pressure 138/62, pulse 70, temperature 97.7 F (36.5 C), resp. rate 17, height '5\' 2"'$  (1.575 m), weight 75 kg, SpO2 94 %.   Vitals  labs and radiology finding personally reviewed  Review of Systems:    Pertinent positives include:  chills,  fatigue,Sore throat,  post nasal drip,  Constitutional:  No weight loss, night sweats, Fevers,  weight loss  HEENT:  No headaches, Difficulty swallowing,Tooth/dental problems, No sneezing, itching, ear ache, nasal congestion,  Cardio-vascular:  No chest pain, Orthopnea, PND, anasarca, dizziness, palpitations.no Bilateral lower extremity swelling  GI:  No heartburn, indigestion, abdominal pain, nausea, vomiting, diarrhea, change in bowel habits, loss of appetite, melena, blood in stool, hematemesis Resp:  no shortness of breath at rest. No dyspnea on exertion, No excess mucus, no productive cough, No non-productive cough, No coughing up of blood.No change in color of mucus.No wheezing. Skin:  no rash or lesions. No jaundice GU:  no dysuria, change in color of urine, no urgency or frequency. No straining to urinate.  No flank pain.  Musculoskeletal:  No joint pain or no joint swelling. No decreased range of motion. No back pain.  Psych:  No change in mood or affect. No depression or anxiety. No memory loss.  Neuro: no localizing neurological complaints, no tingling, no weakness, no double vision, no gait abnormality, no slurred speech, no confusion  All systems reviewed and apart from [place] all are negative _______________________________________________________________________________________________ Past Medical History:   Past Medical History:  Diagnosis Date   Cancer ([place])    Chronic kidney disease    stage 3   Depression  Diabetes mellitus without complication (HCC)    Difficulty breathing    GERD (gastroesophageal reflux disease)    Hypertension    IBS (irritable bowel syndrome)    Muscle pain    Palpitation    PONV (postoperative nausea and vomiting)    Swelling      Past Surgical History:  Procedure Laterality Date   ABDOMINAL HYSTERECTOMY     ANKLE FRACTURE SURGERY Right    BREAST SURGERY     right   CHOLECYSTECTOMY     COLONOSCOPY     Dr Odie Sera in  [place], > 10 yrs prior to 2021.     DECOMPRESSIVE LUMBAR LAMINECTOMY LEVEL 1 Right 10/30/2016   Procedure: RIGHT L5-S1 DECOMPRESSION, EXCISION OF CYST LEVEL 1;  Surgeon: Melina Schools, MD;  Location: [facility];  Service: Orthopedics;  Laterality: Right;   OVARY SURGERY      Social History:  Ambulatory   walker      reports that she has never smoked. She has never used smokeless tobacco. She reports that she does not drink alcohol and does not use drugs.    Family History: Non contributory ______________________________________________________________________________________________ Allergies: Allergies  Allergen Reactions   Sulfa Antibiotics Itching and Rash   Adhesive [Tape] Dermatitis    Blisters the skin     Prior to Admission medications   Medication Sig Start Date End Date Taking? Authorizing Provider  amLODipine (NORVASC) 5 MG tablet Take 1 tablet by mouth daily.    [provider]  aspirin 81 MG EC tablet Take 1 tablet by mouth daily.    [provider]  bupivacaine, PF, (MARCAINE) 0.25 % SOLN injection Inject into the skin. 01/24/22   [provider]  cephALEXin (KEFLEX) 500 MG capsule Take 1 capsule (500 mg total) by mouth 3 (three) times daily. 05/30/22   Marzetta Board, DPM  dexamethasone (DECADRON) 4 MG tablet Take 6 mg by mouth daily. 05/03/22   [provider]  doxazosin (CARDURA) 1 MG tablet Take 1 mg by mouth at bedtime. 08/24/20   [provider]  feeding supplement, ENSURE ENLIVE, (ENSURE ENLIVE) LIQD Take 237 mLs by mouth 2 (two) times daily between meals. 09/26/20   Regalado, Belkys A, MD  fluticasone (FLONASE) 50 MCG/ACT nasal spray Place 2 sprays into both nostrils daily as needed for allergies.  08/07/20   [provider]  furosemide (LASIX) 20 MG tablet Take 20 mg by mouth daily.    [provider]  gabapentin (NEURONTIN) 600 MG tablet Take 600 mg by mouth 2 (two) times daily.     [provider]  hydrochlorothiazide (HYDRODIURIL) 25 MG tablet Take 1 tablet by mouth daily.    [provider]  JARDIANCE 25 MG TABS tablet Take by mouth. 02/04/22   [provider]  losartan (COZAAR) 50 MG tablet Take by mouth. 05/11/21   [provider]  mirtazapine (REMERON) 15 MG tablet Take by mouth. 05/04/21   [provider]  nystatin (MYCOSTATIN) 100000 UNIT/ML suspension Take 5 mLs (500,000 Units total) by mouth 4 (four) times daily. 09/26/20   Regalado, Belkys A, MD  omeprazole (PRILOSEC) 20 MG capsule Take 40 mg by mouth daily.     [provider]  ondansetron (ZOFRAN) 4 MG tablet Take 1 tablet (4 mg total) by mouth every 6 (six) hours as needed for nausea. 09/26/20   Regalado, Belkys A, MD  pantoprazole (PROTONIX) 40 MG tablet TAKE 1 TABLET BY MOUTH ONCE DAILY, DISCONTINUE OMEPRAZOLE 04/21/21  [provider]  pioglitazone (ACTOS) 15 MG tablet Take 1 tablet by mouth daily.    [provider]  potassium chloride (MICRO-K) 10 MEQ CR capsule Take 10 mEq by mouth daily. 04/01/22   [provider]  rivaroxaban (XARELTO) 20 MG TABS tablet Take 1 tablet (20 mg total) by mouth daily with supper. 10/17/20   Regalado, Belkys A, MD  rOPINIRole (REQUIP) 2 MG tablet Take by mouth.    [provider]  rosuvastatin (CRESTOR) 5 MG tablet Take 5 mg by mouth at bedtime.     [provider]  triamcinolone acetonide (TRIESENCE) 40 MG/ML SUSP Inject into the articular space. 01/24/22   [provider]    ___________________________________________________________________________________________________ Physical Exam:    02/15/2023   10:53 PM 02/15/2023    9:54 PM 02/15/2023    9:53 PM  Vitals with BMI  Height  '5\' 2"'$    Weight  165 lbs 6 oz   BMI  [id number]   Systolic 0000000    Diastolic 62    Pulse 70 64 67     1. General:  in No  Acute distress   Chronically ill  -appearing 2. Psychological: Alert and    Oriented 3. Head/ENT:   Dry Mucous Membranes                          Head Non traumatic, neck supple                         Poor Dentition 4. SKIN: decreased Skin turgor,  Skin clean Dry and intact no rash 5. Heart: Regular rate and rhythm no  Murmur, no Rub or gallop 6. Lungs:   no wheezes or crackles   7. Abdomen: Soft,  non-tender, Non distended   obese  bowel sounds present 8. Lower extremities: no clubbing, cyanosis, no  edema 9. Neurologically Grossly intact, moving all 4 extremities equally  10. MSK: Normal range of motion    Chart has been reviewed  ______________________________________________________________________________________________  Assessment/Plan 82 y.o. female with medical history significant of history of DVT on Xarelto, diabetes, hypertension, CKD, HLD  Admitted for dehydration syncope influenza A   Present on Admission:  Syncope  Type 2 diabetes mellitus with stage 3 chronic kidney disease ([place])  Hypertension associated with diabetes ([place])  Acute kidney injury superimposed on CKD ([place])  Hyperlipidemia associated with type 2 diabetes mellitus ([place])  Diarrhea of presumed infectious origin  Hypokalemia  Debility  Influenza A     Syncope In the setting of dehydration and recent influenza infection, Rehydrate check orthostatics in a.m. Obtain echogram Carotid Doppler   Monitor on telemetry Cycle cardiac enzymes  Type 2 diabetes mellitus with stage 3 chronic kidney disease ([place]) Hold home medications order sliding scale  Hypertension associated with diabetes (HCC) Restart Norvasc at 5 mg a day  Acute kidney injury superimposed on CKD (HCC) Obtain electrolytes rehydrate check orthostatics prior to discharge  Hyperlipidemia associated with type 2 diabetes mellitus (HCC) Continue Crestor 5 mg p.o. daily  Diarrhea of presumed infectious origin Associated with recent viral illness.  Will obtain gastric panel given antibiotic exposure will check  positive  Hypokalemia Replace check magnesium and phosphate level replace electrolytes as needed  Debility Likely secondary to dehydration will rehydrate order PT OT evaluation prior to discharge for safety  Influenza A Supportive treatment continue Tamiflu for now 30 mg p.o. twice daily pharmacy to adjust  History of DVT (deep vein thrombosis) Continue eliquis 2.5 mg po bid    Other plan as per orders.  DVT prophylaxis: eliquis    Code Status:    Code Status: Prior FULL CODE  as per patient   I had personally discussed CODE STATUS with patient   Family Communication:   Family not at  Bedside    Disposition Plan:     likely will need placement for rehabilitation                            Following barriers for discharge:                            Electrolytes corrected                                                     Would benefit from PT/OT eval prior to DC  Ordered                     Consults called: none  Admission status:  ED Disposition     ED Disposition  Admit   Condition  --   Canute: [place] [100100]  Level of Care: Telemetry Cardiac [103]  May place patient in observation at [facility] or [place] if equivalent level of care is available:: No  Covid Evaluation: Confirmed COVID Negative  Diagnosis: Syncope [206001]  Admitting Physician: Toy Baker [[id number]]  Attending Physician: Toy Baker [[id number]]           Obs      Level of care     tele  For 24H        Lab Results  Component Value Date   [place] 02/15/2023     Precautions: admitted as   Covid Negative but influenza positive Tavaughn Silguero 02/16/2023, 12:31 AM     [facility]     after 2 AM please page floor coverage PA If 7AM-7PM, please contact the day team taking care of the patient using [email or web address]   Patient was evaluated in the context of the global COVID-19 pandemic, which necessitated  consideration that the patient might be at risk for infection with the SARS-CoV-2 virus that causes COVID-19. Institutional protocols and algorithms that pertain to the evaluation of patients at risk for COVID-19 are in a state of rapid change based on information released by regulatory bodies including the CDC and federal and state organizations. These policies and algorithms were followed during the patient's care.

## 2023-02-15 NOTE — ED Triage Notes (Addendum)
Pt arrived via [facility] for syncopal episode with fall from home. Pt reports she has been feeling weak, N/V, cough x3 days ago and started on antibiotics. Pt reports she was walking with walker from bathroom, became weak and fell to hardwood floor. Pt reports head pain and left knee pain. Upon arrival, it was noted that pt was on blood thinners, upgraded to level 2.  BP 120/60 HR 63 SPO2 94% RA CBG 98

## 2023-02-15 NOTE — ED Notes (Signed)
ED TO INPATIENT HANDOFF REPORT  ED Nurse Name and Phone #: [place] RN [phone]  Pt is alert and oriented x4, feeling unwell (weak, nausea, vomiting) for last 3 days, started on antibiotics outpatient. Pt fell went ambulating from restroom using walker, does endorse head pain, but no injury noted. Pt tested positive for the flu. On room air.   S Name/Age/Gender Anna Stone 82 y.o. female Room/Bed: RESUSC/RESUSC  Code Status   Code Status: Full Code  Home/SNF/Other Home Patient oriented to: self, place, time, and situation Is this baseline? Yes   Triage Complete: Triage complete  Chief Complaint Syncope [R55]  Triage Note Pt arrived via [facility] for syncopal episode with fall from home. Pt reports she has been feeling weak, N/V, cough x3 days ago and started on antibiotics. Pt reports she was walking with walker from bathroom, became weak and fell to hardwood floor. Pt reports head pain and left knee pain. Upon arrival, it was noted that pt was on blood thinners, upgraded to level 2.  BP 120/60 HR 63 SPO2 94% RA CBG 98   Allergies Allergies  Allergen Reactions   Sulfa Antibiotics Itching and Rash   Adhesive [Tape] Dermatitis    Blisters the skin    Level of Care/Admitting Diagnosis ED Disposition     ED Disposition  Admit   Condition  --   Comment  Hospital Area: [facility] [[id number]]  Level of Care: Telemetry Cardiac [[id number]]  May place patient in observation at [facility] or [place] if equivalent level of care is available:: No  Covid Evaluation: Confirmed COVID Negative  Diagnosis: Syncope [206001]  Admitting Physician: Toy Baker [[id number]]  Attending Physician: Toy Baker [[id number]]          B Medical/Surgery History Past Medical History:  Diagnosis Date   Cancer ([place])    Chronic kidney disease    stage 3   Depression    Diabetes mellitus without complication ([place])    Difficulty breathing    GERD  (gastroesophageal reflux disease)    Hypertension    IBS (irritable bowel syndrome)    Muscle pain    Palpitation    PONV (postoperative nausea and vomiting)    Swelling    Past Surgical History:  Procedure Laterality Date   ABDOMINAL HYSTERECTOMY     ANKLE FRACTURE SURGERY Right    BREAST SURGERY     right   CHOLECYSTECTOMY     COLONOSCOPY     Dr Odie Sera in [place], > 10 yrs prior to 2021.     DECOMPRESSIVE LUMBAR LAMINECTOMY LEVEL 1 Right 10/30/2016   Procedure: RIGHT L5-S1 DECOMPRESSION, EXCISION OF CYST LEVEL 1;  Surgeon: Melina Schools, MD;  Location: [facility];  Service: Orthopedics;  Laterality: Right;   OVARY SURGERY       A IV Location/Drains/Wounds Patient Lines/Drains/Airways Status     Active Line/Drains/Airways     Name Placement date Placement time Site Days   Peripheral IV 02/15/23 20 G Left Antecubital 02/15/23  2204  Antecubital  less than 1   Incision (Closed) 10/30/16 Back Other (Comment) 10/30/16  1356  -- 2299   Wound / Incision (Open or Dehisced) 09/21/20 (MASD) Moisture Associated Skin Damage Buttocks Right;Left;Bilateral Red Moisture associated skin damage bilateral inner buttocks 09/21/20  1800  Buttocks  877            Intake/Output Last 24 hours No intake or output data in the 24 hours ending 02/15/23 2353  Labs/Imaging Results  for orders placed or performed during the hospital encounter of 02/15/23 (from the past 48 hour(s))  CBG monitoring, ED     Status: Abnormal   Collection Time: 02/15/23  9:59 PM  Result Value Ref Range   Glucose-Capillary 112 (H) 70 - 99 mg/dL    Comment: Glucose reference range applies only to samples taken after fasting for at least 8 hours.  Comprehensive metabolic panel     Status: Abnormal   Collection Time: 02/15/23 10:00 PM  Result Value Ref Range   Sodium 140 135 - 145 mmol/L   Potassium 2.8 (L) 3.5 - 5.1 mmol/L   Chloride 101 98 - 111 mmol/L   CO2 24 22 - 32 mmol/L   Glucose, Bld 117 (H) 70 - 99 mg/dL     Comment: Glucose reference range applies only to samples taken after fasting for at least 8 hours.   BUN 24 (H) 8 - 23 mg/dL   Creatinine, Ser 1.44 (H) 0.44 - 1.00 mg/dL   Calcium 8.8 (L) 8.9 - 10.3 mg/dL   Total Protein 7.2 6.5 - 8.1 g/dL   Albumin 3.7 3.5 - 5.0 g/dL   AST 29 15 - 41 U/L   ALT 14 0 - 44 U/L   Alkaline Phosphatase 61 38 - 126 U/L   Total Bilirubin 0.7 0.3 - 1.2 mg/dL   GFR, Estimated 36 (L) >60 mL/min    Comment: (NOTE) Calculated using the CKD-EPI Creatinine Equation (2021)    Anion gap 15 5 - 15    Comment: Performed at [facility] [address], [place], [place] [address]  CBC     Status: Abnormal   Collection Time: 02/15/23 10:00 PM  Result Value Ref Range   WBC 3.8 (L) 4.0 - 10.5 K/uL   RBC 4.99 3.87 - 5.11 MIL/uL   Hemoglobin 13.6 12.0 - 15.0 g/dL   HCT 42.2 36.0 - 46.0 %   MCV 84.6 80.0 - 100.0 fL   MCH 27.3 26.0 - 34.0 pg   MCHC 32.2 30.0 - 36.0 g/dL   RDW 14.8 11.5 - 15.5 %   Platelets 121 (L) 150 - 400 K/uL   nRBC 0.0 0.0 - 0.2 %    Comment: Performed at [facility], [place] [address], [place], [place] [address]  Ethanol     Status: None   Collection Time: 02/15/23 10:00 PM  Result Value Ref Range   Alcohol, Ethyl (B) <10 <10 mg/dL    Comment: (NOTE) Lowest detectable limit for serum alcohol is 10 mg/dL.  For medical purposes only. Performed at [facility], [place] [address], [place], [place] [address]   Lactic acid, plasma     Status: None   Collection Time: 02/15/23 10:00 PM  Result Value Ref Range   Lactic Acid, Venous 1.5 0.5 - 1.9 mmol/L    Comment: Performed at [facility] [address], [place], [place] [address]  Protime-INR     Status: Abnormal   Collection Time: 02/15/23 10:00 PM  Result Value Ref Range   Prothrombin Time 15.4 (H) 11.4 - 15.2 seconds   INR 1.2 0.8 - 1.2    Comment: (NOTE) INR goal varies based on device and disease states. Performed at [facility], [place] [address]  [place],  [address]   Sample to Blood Bank     Status: None   Collection Time: 02/15/23 10:00 PM  Result Value Ref Range   Blood Bank Specimen  SAMPLE AVAILABLE FOR TESTING    Sample Expiration      02/18/2023,2359 Performed at [facility], [place] [address], [place], [place] [address]   CDS serology     Status: None   Collection Time: 02/15/23 10:00 PM  Result Value Ref Range   CDS serology specimen      SPECIMEN WILL BE HELD FOR 14 DAYS IF TESTING IS REQUIRED    Comment: Performed at [facility], [place] [address], [place], [place] [address]  Magnesium     Status: None   Collection Time: 02/15/23 10:00 PM  Result Value Ref Range   Magnesium 1.9 1.7 - 2.4 mg/dL    Comment: Performed at [facility], [place] [address] [place], [place] [address]  I-Stat Chem 8, ED     Status: Abnormal   Collection Time: 02/15/23 10:13 PM  Result Value Ref Range   Sodium 140 135 - 145 mmol/L   Potassium 2.9 (L) 3.5 - 5.1 mmol/L   Chloride 101 98 - 111 mmol/L   BUN 25 (H) 8 - 23 mg/dL   Creatinine, Ser 1.50 (H) 0.44 - 1.00 mg/dL   Glucose, Bld 112 (H) 70 - 99 mg/dL    Comment: Glucose reference range applies only to samples taken after fasting for at least 8 hours.   Calcium, Ion 1.10 (L) 1.15 - 1.40 mmol/L   TCO2 27 22 - 32 mmol/L   Hemoglobin 14.3 12.0 - 15.0 g/dL   HCT 42.0 36.0 - 46.0 %  Resp panel by RT-PCR (RSV, Flu A&B, Covid) Anterior Nasal Swab     Status: Abnormal   Collection Time: 02/15/23 10:39 PM   Specimen: Anterior Nasal Swab  Result Value Ref Range   SARS Coronavirus 2 by RT PCR NEGATIVE NEGATIVE   Influenza A by PCR POSITIVE (A) NEGATIVE   Influenza B by PCR NEGATIVE NEGATIVE    Comment: (NOTE) The Xpert Xpress SARS-CoV-2/FLU/RSV plus assay is intended as an aid in the diagnosis of influenza from Nasopharyngeal swab specimens and should not be used as a sole basis for treatment. Nasal washings and aspirates are unacceptable for Xpert Xpress  SARS-CoV-2/FLU/RSV testing.  Fact Sheet for Patients: EntrepreneurPulse.com.au  Fact Sheet for Healthcare Providers: IncredibleEmployment.be  This test is not yet approved or cleared by the [place] FDA and has been authorized for detection and/or diagnosis of SARS-CoV-2 by FDA under an Emergency Use Authorization (EUA). This EUA will remain in effect (meaning this test can be used) for the duration of the COVID-19 declaration under Section 564(b)(1) of the Act, 21 U.S.C. section 360bbb-3(b)(1), unless the authorization is terminated or revoked.     Resp Syncytial Virus by PCR NEGATIVE NEGATIVE    Comment: (NOTE) Fact Sheet for Patients: EntrepreneurPulse.com.au  Fact Sheet for Healthcare Providers: IncredibleEmployment.be  This test is not yet approved or cleared by the [place] FDA and has been authorized for detection and/or diagnosis of SARS-CoV-2 by FDA under an Emergency Use Authorization (EUA). This EUA will remain in effect (meaning this test can be used) for the duration of the COVID-19 declaration under Section 564(b)(1) of the Act, 21 U.S.C. section 360bbb-3(b)(1), unless the authorization is terminated or revoked.  Performed at [facility], [place] [address], [place],  [address]    CT T-SPINE NO CHARGE  Result Date: 02/15/2023 CLINICAL DATA:  Trauma EXAM: CT THORACIC SPINE WITHOUT CONTRAST TECHNIQUE: Multidetector CT images of the thoracic were obtained using the standard protocol without  intravenous contrast. RADIATION DOSE REDUCTION: This exam was performed according to the departmental dose-optimization program which includes automated exposure control, adjustment of the mA and/or kV according to patient size and/or use of iterative reconstruction technique. COMPARISON:  None Available. FINDINGS: Alignment: Normal. Vertebrae: No acute fracture or focal pathologic process.  Paraspinal and other soft tissues: Negative. Disc levels: Early anterior spurring in the mid to lower thoracic spine. No disc herniation. IMPRESSION: No acute bony abnormality. Electronically Signed   By: Rolm Baptise M.D.   On: 02/15/2023 23:19   CT L-SPINE NO CHARGE  Result Date: 02/15/2023 CLINICAL DATA:  Trauma EXAM: CT LUMBAR SPINE WITHOUT CONTRAST TECHNIQUE: Multidetector CT imaging of the lumbar spine was performed without intravenous contrast administration. Multiplanar CT image reconstructions were also generated. RADIATION DOSE REDUCTION: This exam was performed according to the departmental dose-optimization program which includes automated exposure control, adjustment of the mA and/or kV according to patient size and/or use of iterative reconstruction technique. COMPARISON:  None Available. FINDINGS: Segmentation: 5 lumbar type vertebrae. Alignment: Slight anterolisthesis of L4 on L5 and L5 on S1 related to facet disease. Vertebrae: No acute fracture or focal pathologic process. Paraspinal and other soft tissues: Negative. Disc levels: No visible disc herniation. Disc spaces maintained. Degenerative facet disease in the lower lumbar spine. IMPRESSION: No acute bony abnormality. Electronically Signed   By: Rolm Baptise M.D.   On: 02/15/2023 23:18   CT CHEST ABDOMEN PELVIS WO CONTRAST  Result Date: 02/15/2023 CLINICAL DATA:  Polytrauma, blunt EXAM: CT CHEST, ABDOMEN AND PELVIS WITHOUT CONTRAST TECHNIQUE: Multidetector CT imaging of the chest, abdomen and pelvis was performed following the standard protocol without IV contrast. RADIATION DOSE REDUCTION: This exam was performed according to the departmental dose-optimization program which includes automated exposure control, adjustment of the mA and/or kV according to patient size and/or use of iterative reconstruction technique. COMPARISON:  None Available. FINDINGS: CT CHEST FINDINGS Cardiovascular: Heart is normal size. Moderate coronary artery and  aortic calcifications. No aneurysm. Mediastinum/Nodes: No mediastinal, hilar, or axillary adenopathy. Trachea and esophagus are unremarkable. Thyroid unremarkable. Lungs/Pleura: No confluent opacities, effusions or pneumothorax. Musculoskeletal: Chest wall soft tissues are unremarkable. No acute bony abnormality. CT ABDOMEN PELVIS FINDINGS Hepatobiliary: 2 cm low-density lesion in the left hepatic lobe, likely cyst. 3 cm exophytic low-density lesion in the inferior right hepatic lobe, also likely cysts. Prior cholecystectomy. No evidence of a hepatic injury or perihepatic hematoma. Pancreas: No focal abnormality or ductal dilatation. Spleen: No splenic injury or perisplenic hematoma. Adrenals/Urinary Tract: No adrenal hemorrhage or renal injury identified. Bladder is unremarkable. Stomach/Bowel: Stomach, large and small bowel grossly unremarkable. Vascular/Lymphatic: Diffuse aortic atherosclerosis. No evidence of aneurysm or adenopathy. Reproductive: Prior hysterectomy.  No adnexal masses. Other: No free fluid or free air. Musculoskeletal: Degenerative facet disease in the lower lumbar spine. No acute bony abnormality. IMPRESSION: No acute findings or evidence of significant traumatic injury in the chest, abdomen or pelvis. Aortic atherosclerosis, coronary artery disease. Electronically Signed   By: Rolm Baptise M.D.   On: 02/15/2023 23:16   CT CERVICAL SPINE WO CONTRAST  Result Date: 02/15/2023 CLINICAL DATA:  Polytrauma, blunt EXAM: CT CERVICAL SPINE WITHOUT CONTRAST TECHNIQUE: Multidetector CT imaging of the cervical spine was performed without intravenous contrast. Multiplanar CT image reconstructions were also generated. RADIATION DOSE REDUCTION: This exam was performed according to the departmental dose-optimization program which includes automated exposure control, adjustment of the mA and/or kV according to patient size and/or use of iterative reconstruction technique. COMPARISON:  None  Available.  FINDINGS: Alignment: Normal Skull base and vertebrae: No acute fracture. No primary bone lesion or focal pathologic process. Soft tissues and spinal canal: No prevertebral fluid or swelling. No visible canal hematoma. Disc levels: Diffuse degenerative disc disease and facet disease. No disc herniation. Upper chest: No acute findings Other: None IMPRESSION: Degenerative disc and facet disease. No acute bony abnormality Electronically Signed   By: Rolm Baptise M.D.   On: 02/15/2023 23:13   CT HEAD WO CONTRAST  Result Date: 02/15/2023 CLINICAL DATA:  Head trauma, moderate-severe EXAM: CT HEAD WITHOUT CONTRAST TECHNIQUE: Contiguous axial images were obtained from the base of the skull through the vertex without intravenous contrast. RADIATION DOSE REDUCTION: This exam was performed according to the departmental dose-optimization program which includes automated exposure control, adjustment of the mA and/or kV according to patient size and/or use of iterative reconstruction technique. COMPARISON:  10/11/2020 FINDINGS: Brain: There is atrophy and chronic small vessel disease changes. No acute intracranial abnormality. Specifically, no hemorrhage, hydrocephalus, mass lesion, acute infarction, or significant intracranial injury. Vascular: No hyperdense vessel or unexpected calcification. Skull: No acute calvarial abnormality. Sinuses/Orbits: No acute findings Other: None IMPRESSION: Atrophy, chronic microvascular disease. No acute intracranial abnormality. Electronically Signed   By: Rolm Baptise M.D.   On: 02/15/2023 23:12   DG Pelvis Portable  Result Date: 02/15/2023 CLINICAL DATA:  Trauma. EXAM: PORTABLE PELVIS 1-2 VIEWS COMPARISON:  None Available. FINDINGS: There is no acute fracture or dislocation. The bones are osteopenic. Degenerative changes of the hips and visualized lower lumbar spine. The soft tissues are unremarkable. IMPRESSION: No acute fracture or dislocation. Electronically Signed   By: Anner Crete M.D.   On: 02/15/2023 22:32   DG Knee Left Port  Result Date: 02/15/2023 CLINICAL DATA:  Recent fall with knee pain, initial encounter EXAM: PORTABLE LEFT KNEE - 2 VIEW COMPARISON:  08/16/2017 FINDINGS: Tricompartmental degenerative changes are noted. No acute fracture or dislocation is seen. No soft tissue abnormality is noted. IMPRESSION: Degenerative change without acute abnormality. Electronically Signed   By: Inez Catalina M.D.   On: 02/15/2023 22:30   DG Chest Port 1 View  Result Date: 02/15/2023 CLINICAL DATA:  Trauma EXAM: PORTABLE CHEST 1 VIEW COMPARISON:  05/01/2022 FINDINGS: Heart and mediastinal contours are within normal limits. No focal opacities or effusions. No acute bony abnormality. IMPRESSION: No active cardiopulmonary disease. Electronically Signed   By: Rolm Baptise M.D.   On: 02/15/2023 22:29    Pending Labs Unresulted Labs (From admission, onward)     Start     Ordered   02/16/23 0500  Prealbumin  Tomorrow morning,   R        02/15/23 2343   02/15/23 2344  Procalcitonin  Add-on,   AD       References:    Procalcitonin Lower Respiratory Tract Infection AND Sepsis Procalcitonin Algorithm   02/15/23 2343   02/15/23 2344  CK  Add-on,   AD        02/15/23 2343   02/15/23 2344  Phosphorus  Add-on,   AD        02/15/23 2343   02/15/23 2344  Sodium, urine, random  Once,   R        02/15/23 2343   02/15/23 2344  Creatinine, urine, random  Once,   R        02/15/23 2343   02/15/23 2344  Osmolality  Add-on,   AD        02/15/23 2343  02/15/23 2344  Osmolality, urine  Once,   R        02/15/23 2343   02/15/23 2344  TSH  Add-on,   AD        02/15/23 2343   02/15/23 2154  Urinalysis, Routine w reflex microscopic -Urine, Clean Catch  (Trauma Panel)  Once,   URGENT       Question:  Specimen Source  Answer:  Urine, Clean Catch   02/15/23 2155   Signed and Held  Comprehensive metabolic panel  Tomorrow morning,   R       Question:  Release to patient  Answer:   Immediate   Signed and Held   Signed and Held  CBC  Tomorrow morning,   R       Question:  Release to patient  Answer:  Immediate   Signed and Held   Signed and Held  Magnesium  Tomorrow morning,   R        Signed and Held   Signed and Held  Phosphorus  Tomorrow morning,   R        Signed and Held            Vitals/Pain Today's Vitals   02/15/23 2154 02/15/23 2154 02/15/23 2253 02/15/23 2330  BP:   138/62 136/68  Pulse: 64  70 68  Resp: '14  17 16  '$ Temp:      SpO2: 99%  94% 96%  Weight:  75 kg    Height:  '5\' 2"'$  (1.575 m)      Isolation Precautions Droplet precaution  Medications Medications  sodium chloride 0.9 % bolus 1,000 mL (0 mLs Intravenous Stopped 02/15/23 2340)    And  0.9 %  sodium chloride infusion ( Intravenous New Bag/Given 02/15/23 2246)  potassium chloride SA (KLOR-CON M) CR tablet 40 mEq (40 mEq Oral Not Given 02/15/23 2235)  potassium chloride 10 mEq in 100 mL IVPB (10 mEq Intravenous New Bag/Given 02/15/23 2246)  oseltamivir (TAMIFLU) capsule 30 mg (has no administration in time range)  ondansetron (ZOFRAN) injection 4 mg (4 mg Intravenous Given 02/15/23 2207)    Mobility walks with device     Focused Assessments    R Recommendations: See Admitting Provider Note  Report given to:

## 2023-02-15 NOTE — Progress Notes (Signed)
Orthopedic Tech Progress Note Patient Details:  Anna Stone December 19, 1940 [id number]  Patient ID: Anna Stone, female   DOB: [birth year], 82 y.o.   MRN: [id number] I attended trauma page. Karolee Stamps 02/15/2023, 11:30 PM

## 2023-02-16 ENCOUNTER — Observation Stay (HOSPITAL_COMMUNITY): Payer: Medicare Other

## 2023-02-16 ENCOUNTER — Other Ambulatory Visit: Payer: Self-pay

## 2023-02-16 ENCOUNTER — Observation Stay (HOSPITAL_BASED_OUTPATIENT_CLINIC_OR_DEPARTMENT_OTHER): Payer: Medicare Other

## 2023-02-16 ENCOUNTER — Encounter (HOSPITAL_COMMUNITY): Payer: Self-pay | Admitting: Internal Medicine

## 2023-02-16 DIAGNOSIS — N189 Chronic kidney disease, unspecified: Secondary | ICD-10-CM | POA: Diagnosis not present

## 2023-02-16 DIAGNOSIS — R55 Syncope and collapse: Secondary | ICD-10-CM | POA: Diagnosis not present

## 2023-02-16 DIAGNOSIS — Z86711 Personal history of pulmonary embolism: Secondary | ICD-10-CM | POA: Diagnosis not present

## 2023-02-16 DIAGNOSIS — Z86718 Personal history of other venous thrombosis and embolism: Secondary | ICD-10-CM | POA: Diagnosis not present

## 2023-02-16 DIAGNOSIS — Z1152 Encounter for screening for COVID-19: Secondary | ICD-10-CM | POA: Diagnosis not present

## 2023-02-16 DIAGNOSIS — E876 Hypokalemia: Secondary | ICD-10-CM | POA: Diagnosis not present

## 2023-02-16 DIAGNOSIS — E663 Overweight: Secondary | ICD-10-CM | POA: Diagnosis present

## 2023-02-16 DIAGNOSIS — G4733 Obstructive sleep apnea (adult) (pediatric): Secondary | ICD-10-CM | POA: Diagnosis not present

## 2023-02-16 DIAGNOSIS — E669 Obesity, unspecified: Secondary | ICD-10-CM | POA: Diagnosis not present

## 2023-02-16 DIAGNOSIS — E785 Hyperlipidemia, unspecified: Secondary | ICD-10-CM | POA: Diagnosis not present

## 2023-02-16 DIAGNOSIS — E1122 Type 2 diabetes mellitus with diabetic chronic kidney disease: Secondary | ICD-10-CM | POA: Diagnosis not present

## 2023-02-16 DIAGNOSIS — N179 Acute kidney failure, unspecified: Secondary | ICD-10-CM | POA: Diagnosis not present

## 2023-02-16 DIAGNOSIS — E860 Dehydration: Secondary | ICD-10-CM | POA: Diagnosis not present

## 2023-02-16 DIAGNOSIS — J1089 Influenza due to other identified influenza virus with other manifestations: Secondary | ICD-10-CM | POA: Diagnosis not present

## 2023-02-16 DIAGNOSIS — Z6830 Body mass index (BMI) 30.0-30.9, adult: Secondary | ICD-10-CM | POA: Diagnosis not present

## 2023-02-16 DIAGNOSIS — J101 Influenza due to other identified influenza virus with other respiratory manifestations: Secondary | ICD-10-CM | POA: Diagnosis present

## 2023-02-16 DIAGNOSIS — Z7901 Long term (current) use of anticoagulants: Secondary | ICD-10-CM | POA: Diagnosis not present

## 2023-02-16 DIAGNOSIS — R197 Diarrhea, unspecified: Secondary | ICD-10-CM | POA: Diagnosis not present

## 2023-02-16 LAB — CK: Total CK: 129 U/L (ref 38–234)

## 2023-02-16 LAB — COMPREHENSIVE METABOLIC PANEL
ALT: 11 U/L (ref 0–44)
AST: 21 U/L (ref 15–41)
Albumin: 2.8 g/dL — ABNORMAL LOW (ref 3.5–5.0)
Alkaline Phosphatase: 48 U/L (ref 38–126)
Anion gap: 8 (ref 5–15)
BUN: 20 mg/dL (ref 8–23)
CO2: 24 mmol/L (ref 22–32)
Calcium: 7.7 mg/dL — ABNORMAL LOW (ref 8.9–10.3)
Chloride: 107 mmol/L (ref 98–111)
Creatinine, Ser: 1.16 mg/dL — ABNORMAL HIGH (ref 0.44–1.00)
GFR, Estimated: 46 mL/min — ABNORMAL LOW (ref >60)
Glucose, Bld: 100 mg/dL — ABNORMAL HIGH (ref 70–99)
Potassium: 3.6 mmol/L (ref 3.5–5.1)
Sodium: 139 mmol/L (ref 135–145)
Total Bilirubin: 0.6 mg/dL (ref 0.3–1.2)
Total Protein: 5.8 g/dL — ABNORMAL LOW (ref 6.5–8.1)

## 2023-02-16 LAB — CBC
HCT: 34.9 % — ABNORMAL LOW (ref 36.0–46.0)
Hemoglobin: 11.4 g/dL — ABNORMAL LOW (ref 12.0–15.0)
MCH: 27.5 pg (ref 26.0–34.0)
MCHC: 32.7 g/dL (ref 30.0–36.0)
MCV: 84.3 fL (ref 80.0–100.0)
Platelets: 97 10*3/uL — ABNORMAL LOW (ref 150–400)
RBC: 4.14 MIL/uL (ref 3.87–5.11)
RDW: 14.7 % (ref 11.5–15.5)
WBC: 2.7 10*3/uL — ABNORMAL LOW (ref 4.0–10.5)
nRBC: 0 % (ref 0.0–0.2)

## 2023-02-16 LAB — ECHOCARDIOGRAM COMPLETE
Area-P 1/2: 2.8 cm2
Height: 62 in
P 1/2 time: 534 msec
S' Lateral: 3 cm
Weight: 2345.69 oz

## 2023-02-16 LAB — PROCALCITONIN: Procalcitonin: <0.1 ng/mL

## 2023-02-16 LAB — OSMOLALITY: Osmolality: 303 mOsm/kg — ABNORMAL HIGH (ref 275–295)

## 2023-02-16 LAB — GLUCOSE, CAPILLARY
Glucose-Capillary: 108 mg/dL — ABNORMAL HIGH (ref 70–99)
Glucose-Capillary: 116 mg/dL — ABNORMAL HIGH (ref 70–99)
Glucose-Capillary: 83 mg/dL (ref 70–99)

## 2023-02-16 LAB — MAGNESIUM: Magnesium: 1.6 mg/dL — ABNORMAL LOW (ref 1.7–2.4)

## 2023-02-16 LAB — TROPONIN I (HIGH SENSITIVITY): Troponin I (High Sensitivity): 11 ng/L (ref <18)

## 2023-02-16 LAB — PREALBUMIN: Prealbumin: 12 mg/dL — ABNORMAL LOW (ref 18–38)

## 2023-02-16 LAB — TSH: TSH: 5.581 u[IU]/mL — ABNORMAL HIGH (ref 0.350–4.500)

## 2023-02-16 LAB — PHOSPHORUS: Phosphorus: 3.8 mg/dL (ref 2.5–4.6)

## 2023-02-16 MED ORDER — SODIUM CHLORIDE 0.9 % IV SOLN: INTRAVENOUS | Status: AC

## 2023-02-16 MED ORDER — MIRTAZAPINE 15 MG PO TABS
15.0000 mg | ORAL_TABLET | Freq: Every day | ORAL | Status: DC
  Filled 2023-02-16 (×2): qty 1

## 2023-02-16 MED ORDER — INSULIN ASPART 100 UNIT/ML IJ SOLN: 0.0000 [IU] | INTRAMUSCULAR | Status: DC

## 2023-02-16 MED ORDER — GABAPENTIN 300 MG PO CAPS
600.0000 mg | ORAL_CAPSULE | Freq: Two times a day (BID) | ORAL | Status: DC
  Administered 2023-02-16: 600 mg via ORAL
  Filled 2023-02-16: qty 2

## 2023-02-16 MED ORDER — PNEUMOCOCCAL 20-VAL CONJ VACC 0.5 ML IM SUSY: 0.5000 mL | PREFILLED_SYRINGE | INTRAMUSCULAR | Status: DC

## 2023-02-16 MED ORDER — FUROSEMIDE 10 MG/ML IJ SOLN: 20.0000 mg | Freq: Two times a day (BID) | INTRAMUSCULAR | Status: DC

## 2023-02-16 MED ORDER — ALBUTEROL SULFATE (2.5 MG/3ML) 0.083% IN NEBU: 2.5000 mg | INHALATION_SOLUTION | RESPIRATORY_TRACT | Status: DC | PRN

## 2023-02-16 MED ORDER — RIVAROXABAN 10 MG PO TABS: 20.0000 mg | ORAL_TABLET | Freq: Every day | ORAL | Status: DC

## 2023-02-16 MED ORDER — MAGNESIUM SULFATE 2 GM/50ML IV SOLN
2.0000 g | Freq: Once | INTRAVENOUS | Status: AC
  Administered 2023-02-16: 2 g via INTRAVENOUS
  Filled 2023-02-16: qty 50

## 2023-02-16 MED ORDER — ROSUVASTATIN CALCIUM 5 MG PO TABS: 5.0000 mg | ORAL_TABLET | Freq: Every day | ORAL | Status: DC

## 2023-02-16 MED ORDER — ACETAMINOPHEN 650 MG RE SUPP: 650.0000 mg | Freq: Four times a day (QID) | RECTAL | Status: DC | PRN

## 2023-02-16 MED ORDER — SALINE SPRAY 0.65 % NA SOLN
1.0000 | NASAL | Status: DC | PRN
  Administered 2023-02-16: 1 via NASAL
  Filled 2023-02-16: qty 44

## 2023-02-16 MED ORDER — ASPIRIN 81 MG PO TBEC: 81.0000 mg | DELAYED_RELEASE_TABLET | Freq: Every day | ORAL | Status: DC

## 2023-02-16 MED ORDER — APIXABAN 2.5 MG PO TABS
2.5000 mg | ORAL_TABLET | Freq: Two times a day (BID) | ORAL | Status: DC
  Administered 2023-02-16: 2.5 mg via ORAL
  Filled 2023-02-16: qty 1

## 2023-02-16 MED ORDER — AMLODIPINE BESYLATE 5 MG PO TABS
5.0000 mg | ORAL_TABLET | Freq: Every day | ORAL | Status: DC
  Administered 2023-02-16: 5 mg via ORAL
  Filled 2023-02-16: qty 1

## 2023-02-16 MED ORDER — HYDROCODONE-ACETAMINOPHEN 5-325 MG PO TABS: 1.0000 | ORAL_TABLET | ORAL | Status: DC | PRN

## 2023-02-16 MED ORDER — OSELTAMIVIR PHOSPHATE 30 MG PO CAPS: 30.0000 mg | ORAL_CAPSULE | Freq: Every day | ORAL | 0 refills | Status: DC

## 2023-02-16 MED ORDER — GUAIFENESIN ER 600 MG PO TB12
600.0000 mg | ORAL_TABLET | Freq: Two times a day (BID) | ORAL | Status: DC
  Administered 2023-02-16 (×2): 600 mg via ORAL
  Filled 2023-02-16 (×2): qty 1

## 2023-02-16 MED ORDER — ACETAMINOPHEN 325 MG PO TABS: 650.0000 mg | ORAL_TABLET | Freq: Four times a day (QID) | ORAL | Status: DC | PRN

## 2023-02-16 MED ORDER — ROPINIROLE HCL 0.5 MG PO TABS: 2.0000 mg | ORAL_TABLET | Freq: Every day | ORAL | Status: DC

## 2023-02-16 MED ORDER — PERFLUTREN LIPID MICROSPHERE
1.0000 mL | INTRAVENOUS | Status: DC | PRN
  Administered 2023-02-16: 4 mL via INTRAVENOUS

## 2023-02-16 MED ORDER — PANTOPRAZOLE SODIUM 40 MG PO TBEC
40.0000 mg | DELAYED_RELEASE_TABLET | Freq: Every day | ORAL | Status: DC
  Administered 2023-02-16: 40 mg via ORAL
  Filled 2023-02-16: qty 1

## 2023-02-16 NOTE — Assessment & Plan Note (Signed)
Continue eliquis 2.5 mg po bid

## 2023-02-16 NOTE — Assessment & Plan Note (Addendum)
In the setting of dehydration and recent influenza infection, Rehydrate check orthostatics in a.m. Obtain echogram Carotid Doppler   Monitor on telemetry Cycle cardiac enzymes

## 2023-02-16 NOTE — Assessment & Plan Note (Signed)
CBG stable

## 2023-02-16 NOTE — Assessment & Plan Note (Addendum)
Supportive treatment continue Tamiflu for now 30 mg p.o. twice daily pharmacy to adjust

## 2023-02-16 NOTE — Progress Notes (Signed)
ANTICOAGULATION CONSULT NOTE - Initial Consult  Pharmacy Consult for Eliquis Indication:  h/o DVT  Labs: Recent Labs    02/15/23 2200 02/15/23 2213  HGB 13.6 14.3  HCT 42.2 42.0  PLT 121*  --   LABPROT 15.4*  --   INR 1.2  --   CREATININE 1.44* 1.50*    Estimated Creatinine Clearance: 26.5 mL/min (A) (by C-G formula based on SCr of 1.5 mg/dL (H)).   Medical History: Past Medical History:  Diagnosis Date   Cancer ([place])    Chronic kidney disease    stage 3   Depression    Diabetes mellitus without complication ([facility])    Difficulty breathing    GERD (gastroesophageal reflux disease)    Hypertension    IBS (irritable bowel syndrome)    Muscle pain    Palpitation    PONV (postoperative nausea and vomiting)    Swelling     Medications:  Medications Prior to Admission  Medication Sig Dispense Refill Last Dose   amLODipine (NORVASC) 5 MG tablet Take 1 tablet by mouth daily.   02/15/2023   Calcium Carbonate-Vit D-Min (CALCIUM 600+D PLUS MINERALS) 600-400 MG-UNIT TABS Take 1 tablet by mouth daily.   02/15/2023 at am   cholecalciferol 25 MCG (1000 UT) tablet Take 1,000 Units by mouth daily.   02/15/2023 at am   ELIQUIS 2.5 MG TABS tablet Take 2.5 mg by mouth 2 (two) times daily.   02/15/2023 at 1800   furosemide (LASIX) 20 MG tablet Take 20 mg by mouth daily.   02/15/2023 at am   gabapentin (NEURONTIN) 600 MG tablet Take 600 mg by mouth 2 (two) times daily.    02/15/2023 at pm   JARDIANCE 25 MG TABS tablet Take 25 mg by mouth daily.   02/15/2023 at am   losartan (COZAAR) 50 MG tablet Take 50 mg by mouth daily.   02/15/2023 at am   mirtazapine (REMERON) 15 MG tablet Take 15 mg by mouth at bedtime.   02/15/2023 at pm   oseltamivir (TAMIFLU) 30 MG capsule Take 30 mg by mouth 2 (two) times daily.   02/15/2023 at am   pantoprazole (PROTONIX) 40 MG tablet Take 40 mg by mouth daily.   02/15/2023 at am   rOPINIRole (REQUIP) 2 MG tablet Take 2 mg by mouth at bedtime.   02/15/2023 at pm   rosuvastatin  (CRESTOR) 5 MG tablet Take 5 mg by mouth at bedtime.    02/15/2023 at pm   VENTOLIN HFA 108 (90 Base) MCG/ACT inhaler Inhale 1 puff into the lungs every 6 (six) hours as needed for wheezing or shortness of breath.   02/15/2023   aspirin 81 MG EC tablet Take 1 tablet by mouth daily. (Patient not taking: Reported on 02/16/2023)   Not Taking   omeprazole (PRILOSEC) 20 MG capsule Take 40 mg by mouth daily.  (Patient not taking: Reported on 02/16/2023)   Not Taking   rivaroxaban (XARELTO) 20 MG TABS tablet Take 1 tablet (20 mg total) by mouth daily with supper. (Patient not taking: Reported on 02/16/2023) 30 tablet 3 Not Taking    Assessment: 82yo female admitted for evaluation of syncope, to continue Eliquis for h/o DVT.  Plan:  Continue Eliquis 2.'5mg'$  BID.  Wynona Neat, PharmD, BCPS  02/16/2023,12:40 AM

## 2023-02-16 NOTE — Evaluation (Signed)
Occupational Therapy Evaluation Patient Details Name: Anna Stone MRN: [id number] DOB: 1940/12/20 Today's Date: 02/16/2023   History of Present Illness 82 yo female s/p fall 02/15/23 striking head and L Knee  with a weak long nausea vomit cough. Positive for Flu PMH DVT on Xarelto, DM, HTN, CKD, HLD, CA, IBS   Clinical Impression   PT admitted with flu. Pt currently with functional limitiations due to the deficits listed below (see OT problem list). Pt at baseline is indep with all care. Pt currently demonstrates decreased balance and activity tolerance. Pt is agreeable to continued therapy. Pt does express that her spouse is unable to physically (A) her and he will want her to d/c home.  Pt will benefit from skilled OT to increase their independence and safety with adls and balance to allow discharge SNF.       Recommendations for follow up therapy are one component of a multi-disciplinary discharge planning process, led by the attending physician.  Recommendations may be updated based on patient status, additional functional criteria and insurance authorization.   Follow Up Recommendations  Skilled nursing-short term rehab (<3 hours/day) (likely to decline due to spouse wants patient home)     Assistance Recommended at Discharge Set up Supervision/Assistance  Patient can return home with the following A little help with walking and/or transfers;A lot of help with bathing/dressing/bathroom    Functional Status Assessment  Patient has had a recent decline in their functional status and demonstrates the ability to make significant improvements in function in a reasonable and predictable amount of time.  Equipment Recommendations  BSC/3in1;Other (comment) (RW)    Recommendations for Other Services       Precautions / Restrictions Precautions Precautions: Fall Precaution Comments: orthostatic bp checked and pt elevated BP noted      Mobility Bed Mobility Overal bed  mobility: Needs Assistance Bed Mobility: Rolling, Supine to Sit Rolling: Min guard   Supine to sit: Min assist     General bed mobility comments: pt needs (A) to elevate trunk from a bed 19 degrees    Transfers Overall transfer level: Needs assistance Equipment used: Rolling walker (2 wheels) Transfers: Sit to/from Stand, Bed to chair/wheelchair/BSC Sit to Stand: Mod assist     Step pivot transfers: +2 safety/equipment, Min assist     General transfer comment: assist to power up and stabilize balance. Pt incontinent of urine upon standing. Step pivot transfer with RW bed to recliner.      Balance Overall balance assessment: Needs assistance Sitting-balance support: No upper extremity supported, Feet supported Sitting balance-Leahy Scale: Fair     Standing balance support: During functional activity, Reliant on assistive device for balance, Bilateral upper extremity supported Standing balance-Leahy Scale: Poor                             ADL either performed or assessed with clinical judgement   ADL Overall ADL's : Needs assistance/impaired Eating/Feeding: Modified independent;Sitting   Grooming: Wash/dry hands;Sitting   Upper Body Bathing: Modified independent;Sitting   Lower Body Bathing: Moderate assistance;Sit to/from stand   Upper Body Dressing : Modified independent;Sitting   Lower Body Dressing: Maximal assistance;Sit to/from stand Lower Body Dressing Details (indicate cue type and reason): pt required (A) with don doff underwear, pt was unable to don socks with use of trash can to elevate like patient does at home for socks on stool. Toilet Transfer: +2 for safety/equipment;Moderate assistance;Stand-pivot   Toileting-  Clothing Manipulation and Hygiene: Moderate assistance         General ADL Comments: pt progressed from bed to [facility] and then to chair.     Vision Baseline Vision/History: 1 Wears glasses (reading only)       Perception      Praxis      Pertinent Vitals/Pain Pain Assessment Pain Assessment: No/denies pain     Hand Dominance Right   Extremity/Trunk Assessment Upper Extremity Assessment Upper Extremity Assessment: Generalized weakness   Lower Extremity Assessment Lower Extremity Assessment: Generalized weakness   Cervical / Trunk Assessment Cervical / Trunk Assessment:  (chronic back pain)   Communication Communication Communication: No difficulties   Cognition Arousal/Alertness: Awake/alert Behavior During Therapy: WFL for tasks assessed/performed Overall Cognitive Status: Within Functional Limits for tasks assessed                                       General Comments  VSS RA    Exercises     Shoulder Instructions      Home Living Family/patient expects to be discharged to:: Private residence Living Arrangements: Spouse/significant other Available Help at Discharge: Family;Available 24 hours/day Type of Home: House Home Access: Ramped entrance     Home Layout: Two level;Able to live on main level with bedroom/bathroom;Full bath on main level     Bathroom Shower/Tub: Occupational psychologist: Handicapped height Bathroom Accessibility: Yes How Accessible: Accessible via wheelchair;Accessible via walker Home Equipment: [place]  (2 wheels);Rollator (4 wheels);Wheelchair - manual;BSC/3in1;Grab bars - tub/shower;Grab bars - toilet;Hand held shower head;Adaptive equipment Adaptive Equipment: Reacher;Long-handled shoe horn;Long-handled sponge        Prior Functioning/Environment Prior Level of Function : Driving;Independent/Modified Independent             Mobility Comments: walk rollator but does walk without DME in the house, walks in community no dme, uses grocery cart in grocery store ADLs Comments: normally does all bathing/ dressing indep, requires prop LE on a stool to get socks on. pt reports wear slip on shoes.         OT Problem List: Decreased strength;Decreased activity tolerance;Impaired balance (sitting and/or standing);Decreased safety awareness;Decreased knowledge of use of DME or AE;Decreased knowledge of precautions      OT Treatment/Interventions: Self-care/ADL training;Therapeutic exercise;Energy conservation;DME and/or AE instruction;Manual therapy;Modalities;Therapeutic activities;Patient/family education;Balance training    OT Goals(Current goals can be found in the care plan section) Acute Rehab OT Goals Patient Stated Goal: to get better "i just feel so weak" OT Goal Formulation: With patient Time For Goal Achievement: 03/02/23 Potential to Achieve Goals: Good  OT Frequency: Min 2X/week    Co-evaluation PT/OT/SLP Co-Evaluation/Treatment: Yes Reason for Co-Treatment: For patient/therapist safety;To address functional/ADL transfers PT goals addressed during session: Mobility/safety with mobility;Balance;Proper use of DME OT goals addressed during session: ADL's and self-care;Proper use of Adaptive equipment and DME;Strengthening/ROM      AM-PAC OT "6 Clicks" Daily Activity     Outcome Measure Help from another person eating meals?: A Little Help from another person taking care of personal grooming?: A Little Help from another person toileting, which includes using toliet, bedpan, or urinal?: A Little Help from another person bathing (including washing, rinsing, drying)?: A Little Help from another person to put on and taking off regular upper body clothing?: A Little Help from another person to put on and taking off regular lower body  clothing?: A Lot 6 Click Score: 17   End of Session Equipment Utilized During Treatment: Rolling walker (2 wheels) Nurse Communication: Mobility status;Precautions  Activity Tolerance: Patient tolerated treatment well Patient left: in chair;with call bell/phone within reach;with chair alarm set  OT Visit Diagnosis: Unsteadiness on feet  (R26.81)                Time: [id number] OT Time Calculation (min): 37 min Charges:  OT General Charges $OT Visit: 1 Visit OT Evaluation $OT Eval Moderate Complexity: 1 Mod   Brynn, OTR/L  Acute Rehabilitation Services Office: [phone] .   Jeri Modena 02/16/2023, 11:24 AM

## 2023-02-16 NOTE — Assessment & Plan Note (Signed)
Secondary to flu.  Resolved.  Patient tolerating p.o. without any diarrhea.

## 2023-02-16 NOTE — Hospital Course (Signed)
82 year old female with past medical history of DVT on Xarelto, diabetes mellitus, hypertension who presented to the emergency room on 3/2 after a syncopal event at home.  Patient has been sick for the past week with nausea/vomiting/diarrhea and cough and had been treated outpatient with antibiotics for bronchitis.  In the emergency room, patient with acute kidney injury and creatinine of 1.44 as well as potassium 2.8.  She also tested positive for influenza A.  Patient received potassium supplementation as well as IV fluids.

## 2023-02-16 NOTE — Assessment & Plan Note (Signed)
Meets criteria BMI greater than 25 

## 2023-02-16 NOTE — Assessment & Plan Note (Signed)
Secondary to orthostatic hypotension brought on by dehydration brought on by flu.  Patient now able to keep down p.o.  Blood pressures improved with IV fluids.  Seen by PT and OT recommended skilled nursing, but patient declined, but receptive to home health PT and OT which we set up

## 2023-02-16 NOTE — Assessment & Plan Note (Signed)
Continue Crestor 5 mg p.o. daily

## 2023-02-16 NOTE — Evaluation (Signed)
Physical Therapy Evaluation Patient Details Name: Anna Stone MRN: [id number] DOB: 11-Mar-1941 Today's Date: 02/16/2023  History of Present Illness  82 yo female s/p fall 02/15/23 striking head and L knee with a week long nausea vomit cough. Positive for Flu. PMH: DVT on Xarelto, DM, HTN, CKD, HLD, CA, IBS   Clinical Impression  Pt admitted with above diagnosis. PTA pt lived at home with her husband, mod I mobility with rollator. Pt currently with functional limitations due to the deficits listed below (see PT Problem List). On eval, pt required min assist bed mobility, mod assist sit to stand, and min assist +2 safety SPT with RW. Deficits noted in strength, balance, and activity tolerance. Pt will benefit from skilled PT to increase their independence and safety with mobility to allow discharge to the venue listed below.  Pt reports husband unable to provide physical assist. Recommending ST SNF at this time. Pending LOS, pt demonstrates good potential for progress to home with home health with further acute PT/OT intervention.         Recommendations for follow up therapy are one component of a multi-disciplinary discharge planning process, led by the attending physician.  Recommendations may be updated based on patient status, additional functional criteria and insurance authorization.  Follow Up Recommendations Skilled nursing-short term rehab (<3 hours/day) Can patient physically be transported by private vehicle: Yes    Assistance Recommended at Discharge Frequent or constant Supervision/Assistance  Patient can return home with the following  A lot of help with bathing/dressing/bathroom;Assistance with cooking/housework;Assist for transportation;A little help with walking and/or transfers;Help with stairs or ramp for entrance    Equipment Recommendations None recommended by PT  Recommendations for Other Services       Functional Status Assessment Patient has had a recent  decline in their functional status and demonstrates the ability to make significant improvements in function in a reasonable and predictable amount of time.     Precautions / Restrictions Precautions Precautions: Fall Precaution Comments: orthostatic bp checked and pt elevated BP noted      Mobility  Bed Mobility Overal bed mobility: Needs Assistance Bed Mobility: Rolling, Supine to Sit Rolling: Min guard   Supine to sit: Min assist     General bed mobility comments: +rail, increased time,assist to elevate trunk    Transfers Overall transfer level: Needs assistance Equipment used: Rolling walker (2 wheels) Transfers: Sit to/from Stand, Bed to chair/wheelchair/BSC Sit to Stand: Mod assist   Step pivot transfers: +2 safety/equipment, Min assist       General transfer comment: assist to power up and stabilize balance. Pt incontinent of urine upon standing. Step pivot transfer with RW bed to recliner.    Ambulation/Gait               General Gait Details: unable to progress due to fatigue  Stairs            Wheelchair Mobility    Modified Rankin (Stroke Patients Only)       Balance Overall balance assessment: Needs assistance Sitting-balance support: No upper extremity supported, Feet supported Sitting balance-Leahy Scale: Fair     Standing balance support: During functional activity, Reliant on assistive device for balance, Bilateral upper extremity supported Standing balance-Leahy Scale: Poor                               Pertinent Vitals/Pain Pain Assessment Pain Assessment: No/denies pain  Home Living Family/patient expects to be discharged to:: Private residence Living Arrangements: Spouse/significant other Available Help at Discharge: Family;Available 24 hours/day Type of Home: House Home Access: Ramped entrance       Home Layout: Two level;Able to live on main level with bedroom/bathroom;Full bath on main level Home  Equipment: Shower seat - built Medical sales representative (2 wheels);Rollator (4 wheels);Wheelchair - manual;BSC/3in1;Grab bars - tub/shower;Grab bars - toilet;Hand held shower head;Adaptive equipment      Prior Function Prior Level of Function : Driving;Independent/Modified Independent             Mobility Comments: amb with rollator vs no AD. Uses cart for stability in grocery store. ADLs Comments: normally does all bathing/ dressing indep, requires prop LE on a stool to get socks on. pt reports wear slip on shoes.     Hand Dominance   Dominant Hand: Right    Extremity/Trunk Assessment   Upper Extremity Assessment Upper Extremity Assessment: Generalized weakness    Lower Extremity Assessment Lower Extremity Assessment: Generalized weakness    Cervical / Trunk Assessment Cervical / Trunk Assessment: Other exceptions Cervical / Trunk Exceptions: chronic back pain  Communication   Communication: No difficulties  Cognition Arousal/Alertness: Awake/alert Behavior During Therapy: WFL for tasks assessed/performed Overall Cognitive Status: Within Functional Limits for tasks assessed                                          General Comments General comments (skin integrity, edema, etc.): VSS on RA. Pt stating "I feel as weak as a kitten."    Exercises     Assessment/Plan    PT Assessment Patient needs continued PT services  PT Problem List Decreased strength;Decreased balance;Decreased mobility;Decreased activity tolerance       PT Treatment Interventions Gait training;Functional mobility training;Balance training;Therapeutic activities;Patient/family education;Therapeutic exercise    PT Goals (Current goals can be found in the Care Plan section)  Acute Rehab PT Goals Patient Stated Goal: get stronger PT Goal Formulation: With patient Time For Goal Achievement: 03/02/23 Potential to Achieve Goals: Good    Frequency Min 3X/week     Co-evaluation  PT/OT/SLP Co-Evaluation/Treatment: Yes Reason for Co-Treatment: For patient/therapist safety;To address functional/ADL transfers PT goals addressed during session: Mobility/safety with mobility;Balance;Proper use of DME OT goals addressed during session: ADL's and self-care;Proper use of Adaptive equipment and DME;Strengthening/ROM       AM-PAC PT "6 Clicks" Mobility  Outcome Measure Help needed turning from your back to your side while in a flat bed without using bedrails?: A Little Help needed moving from lying on your back to sitting on the side of a flat bed without using bedrails?: A Lot Help needed moving to and from a bed to a chair (including a wheelchair)?: A Lot Help needed standing up from a chair using your arms (e.g., wheelchair or bedside chair)?: A Little Help needed to walk in hospital room?: A Lot Help needed climbing 3-5 steps with a railing? : Total 6 Click Score: 13    End of Session Equipment Utilized During Treatment: Gait belt Activity Tolerance: Patient tolerated treatment well Patient left: in chair;with call bell/phone within reach;with chair alarm set Nurse Communication: Mobility status PT Visit Diagnosis: Muscle weakness (generalized) (M62.81);Difficulty in walking, not elsewhere classified (R26.2)    Time: [id number] PT Time Calculation (min) (ACUTE ONLY): 32 min   Charges:   PT Evaluation $PT Eval Moderate  Complexity: 1 Mod          Gloriann Loan., PT  Office # [phone]   Lorriane Shire 02/16/2023, 10:41 AM

## 2023-02-16 NOTE — Assessment & Plan Note (Signed)
Patient already had been on Tamiflu, she will continue this

## 2023-02-16 NOTE — TOC Transition Note (Signed)
Transition of Care [facility]) - CM/SW Discharge Note   Patient Details  Name: Anna Stone MRN: [id number] Date of Birth: [birth year]  [facility] of [facility]) CM/SW Contact:  Zenon Mayo, RN Phone Number: 02/16/2023, 2:37 PM   Clinical Narrative:    Patient is for dc home today, per MD sending patient home with [facility] services.  NCM spoke with patient to confirm, declined SNF , she states yes she does not want SNF.  Offered choice, she chose [facility], NCM made referral to [facility] with [facility] for [place], he is able to take referral. Soc will begin 24 to 48 hrs post dc.  Patient states her spouse will transport her home today. Also she state she has a BSC she does not need one.    Final next level of care: Home w Home Health Services Barriers to Discharge: No Barriers Identified   Patient Goals and CMS Choice CMS Medicare.gov Compare Post Acute Care list provided to:: Patient Choice offered to / list presented to : Patient  Discharge Placement                         Discharge Plan and Services Additional resources added to the After Visit Summary for                  DME Arranged: N/A DME Agency: NA       HH Arranged: PT, OT, Refused SNF [place] Agency: [place] Date [facility] Agency Contacted: 02/16/23 Time [place]: [id number] Representative spoke with at [place]: [place] Determinants of Health ([place]) Interventions SDOH Screenings   Food Insecurity: No Food Insecurity (02/16/2023)  Housing: Low Risk  (02/16/2023)  Transportation Needs: No Transportation Needs (02/16/2023)  Utilities: Not At Risk (02/16/2023)  Depression (PHQ2-9): Low Risk  (12/18/2020)  Tobacco Use: Low Risk  (02/16/2023)     Readmission Risk Interventions     No data to display

## 2023-02-16 NOTE — Plan of Care (Signed)

## 2023-02-16 NOTE — Assessment & Plan Note (Signed)
Associated with recent viral illness.  Will obtain gastric panel given antibiotic exposure will check positive

## 2023-02-16 NOTE — Assessment & Plan Note (Signed)
Replacing for Mg 1.5 today. Monitor Mg, replace as needed. 

## 2023-02-16 NOTE — Assessment & Plan Note (Signed)
Likely secondary to dehydration will rehydrate order PT OT evaluation prior to discharge for safety

## 2023-02-16 NOTE — Assessment & Plan Note (Signed)
Following fluid resuscitation, blood pressures started trending back upward.  Resumed on home medications

## 2023-02-16 NOTE — Plan of Care (Signed)
Problem: Education: Goal: Ability to describe self-care measures that may prevent or decrease complications (Diabetes Survival Skills Education) will improve 02/16/2023 1434 by Marvis Moeller, RN Outcome: Adequate for Discharge 02/16/2023 0808 by Marvis Moeller, RN Outcome: Progressing Goal: Individualized Educational Video(s) 02/16/2023 1434 by Marvis Moeller, RN Outcome: Adequate for Discharge 02/16/2023 0808 by Marvis Moeller, RN Outcome: Progressing   Problem: Coping: Goal: Ability to adjust to condition or change in health will improve 02/16/2023 1434 by Marvis Moeller, RN Outcome: Adequate for Discharge 02/16/2023 [id number] by Marvis Moeller, RN Outcome: Progressing   Problem: Fluid Volume: Goal: Ability to maintain a balanced intake and output will improve 02/16/2023 1434 by Marvis Moeller, RN Outcome: Adequate for Discharge 02/16/2023 0808 by Marvis Moeller, RN Outcome: Progressing   Problem: Health Behavior/Discharge Planning: Goal: Ability to identify and utilize available resources and services will improve 02/16/2023 1434 by Marvis Moeller, RN Outcome: Adequate for Discharge 02/16/2023 [id number] by Marvis Moeller, RN Outcome: Progressing Goal: Ability to manage health-related needs will improve 02/16/2023 1434 by Marvis Moeller, RN Outcome: Adequate for Discharge 02/16/2023 [id number] by Marvis Moeller, RN Outcome: Progressing   Problem: Metabolic: Goal: Ability to maintain appropriate glucose levels will improve 02/16/2023 1434 by Marvis Moeller, RN Outcome: Adequate for Discharge 02/16/2023 [id number] by Marvis Moeller, RN Outcome: Progressing   Problem: Nutritional: Goal: Maintenance of adequate nutrition will improve 02/16/2023 1434 by Marvis Moeller, RN Outcome: Adequate for Discharge 02/16/2023 [id number] by Marvis Moeller, RN Outcome: Progressing Goal: Progress toward achieving an optimal weight will improve 02/16/2023 1434 by Marvis Moeller, RN Outcome: Adequate for Discharge 02/16/2023 [id number] by Marvis Moeller, RN Outcome: Progressing   Problem: Skin Integrity: Goal: Risk for impaired skin integrity will decrease 02/16/2023 1434 by Marvis Moeller, RN Outcome: Adequate for Discharge 02/16/2023 [id number] by Marvis Moeller, RN Outcome: Progressing   Problem: Tissue Perfusion: Goal: Adequacy of tissue perfusion will improve 02/16/2023 1434 by Marvis Moeller, RN Outcome: Adequate for Discharge 02/16/2023 0808 by Marvis Moeller, RN Outcome: Progressing   Problem: Education: Goal: Knowledge of General Education information will improve Description: Including pain rating scale, medication(s)/side effects and non-pharmacologic comfort measures 02/16/2023 1434 by Marvis Moeller, RN Outcome: Adequate for Discharge 02/16/2023 [id number] by Marvis Moeller, RN Outcome: Progressing   Problem: Health Behavior/Discharge Planning: Goal: Ability to manage health-related needs will improve 02/16/2023 1434 by Marvis Moeller, RN Outcome: Adequate for Discharge 02/16/2023 [id number] by Marvis Moeller, RN Outcome: Progressing   Problem: Clinical Measurements: Goal: Ability to maintain clinical measurements within normal limits will improve 02/16/2023 1434 by Marvis Moeller, RN Outcome: Adequate for Discharge 02/16/2023 [id number] by Marvis Moeller, RN Outcome: Progressing Goal: Will remain free from infection 02/16/2023 1434 by Marvis Moeller, RN Outcome: Adequate for Discharge 02/16/2023 [id number] by Marvis Moeller, RN Outcome: Progressing Goal: Diagnostic test results will improve 02/16/2023 1434 by Marvis Moeller, RN Outcome: Adequate for Discharge 02/16/2023 [id number] by Marvis Moeller, RN Outcome: Progressing Goal: Respiratory complications will improve 02/16/2023 1434 by Marvis Moeller, RN Outcome: Adequate for Discharge 02/16/2023 [id number] by Marvis Moeller, RN Outcome: Progressing Goal: Cardiovascular  complication will be avoided 02/16/2023 1434 by Marvis Moeller, RN Outcome: Adequate for Discharge 02/16/2023 [id number] by Marvis Moeller, RN Outcome: Progressing   Problem: Activity: Goal: Risk for activity intolerance will decrease 02/16/2023 1434 by Marvis Moeller, RN Outcome: Adequate for Discharge 02/16/2023 [id number] by Marvis Moeller, RN Outcome: Progressing   Problem: Nutrition: Goal: Adequate nutrition will be maintained 02/16/2023 1434 by Marvis Moeller, RN Outcome: Adequate for Discharge 02/16/2023 [id number] by Marvis Moeller, RN Outcome: Progressing  Problem: Coping: Goal: Level of anxiety will decrease 02/16/2023 1434 by Marvis Moeller, RN Outcome: Adequate for Discharge 02/16/2023 [id number] by Marvis Moeller, RN Outcome: Progressing   Problem: Elimination: Goal: Will not experience complications related to bowel motility 02/16/2023 1434 by Marvis Moeller, RN Outcome: Adequate for Discharge 02/16/2023 [id number] by Marvis Moeller, RN Outcome: Progressing Goal: Will not experience complications related to urinary retention 02/16/2023 1434 by Marvis Moeller, RN Outcome: Adequate for Discharge 02/16/2023 [id number] by Marvis Moeller, RN Outcome: Progressing   Problem: Pain Managment: Goal: General experience of comfort will improve 02/16/2023 1434 by Marvis Moeller, RN Outcome: Adequate for Discharge 02/16/2023 [id number] by Marvis Moeller, RN Outcome: Progressing   Problem: Safety: Goal: Ability to remain free from injury will improve 02/16/2023 1434 by Marvis Moeller, RN Outcome: Adequate for Discharge 02/16/2023 [id number] by Marvis Moeller, RN Outcome: Progressing   Problem: Skin Integrity: Goal: Risk for impaired skin integrity will decrease 02/16/2023 1434 by Marvis Moeller, RN Outcome: Adequate for Discharge 02/16/2023 [id number] by Marvis Moeller, RN Outcome: Progressing

## 2023-02-16 NOTE — Assessment & Plan Note (Signed)
Replace check magnesium and phosphate level replace electrolytes as needed

## 2023-02-16 NOTE — Assessment & Plan Note (Signed)
Obtain electrolytes rehydrate check orthostatics prior to discharge

## 2023-02-16 NOTE — Assessment & Plan Note (Signed)
Hold home medications order sliding scale

## 2023-02-16 NOTE — Discharge Summary (Signed)
Physician Discharge Summary   Patient: Anna Stone MRN: [id number] DOB: 1941-10-10  Admit date:     02/15/2023  Discharge date: 02/16/23  Discharge Physician: Annita Brod   PCP: Marco Collie, MD   Recommendations at discharge:  Overweight Patient being discharged home with home health PT and OT Will finish course of Tamiflu  Discharge Diagnoses: Principal Problem:   Syncope Active Problems:   Influenza A   Hypertension associated with diabetes ([place])   Type 2 diabetes mellitus with stage 3 chronic kidney disease ([place])   Overweight (BMI 25.0-29.9)   Debility   Hyperlipidemia associated with type 2 diabetes mellitus ([place])   History of DVT (deep vein thrombosis)  Resolved Problems:   Acute kidney injury superimposed on CKD ([place])   Diarrhea of presumed infectious origin   Hypokalemia  Hospital Course: 82 year old female with past medical history of DVT on Xarelto, diabetes mellitus, hypertension who presented to the emergency room on 3/2 after a syncopal event at home.  Patient has been sick for the past week with nausea/vomiting/diarrhea and cough and had been treated outpatient with antibiotics for bronchitis.  In the emergency room, patient with acute kidney injury and creatinine of 1.44 as well as potassium 2.8.  She also tested positive for influenza A.  Patient received potassium supplementation as well as IV fluids.  Assessment and Plan: * Syncope Secondary to orthostatic hypotension brought on by dehydration brought on by flu.  Patient now able to keep down p.o.  Blood pressures improved with IV fluids.  Seen by PT and OT recommended skilled nursing, but patient declined, but receptive to home health PT and OT which we set up  Acute kidney injury superimposed on CKD (HCC)-resolved as of 02/16/2023 Resolved with IV fluids  Diarrhea of presumed infectious origin-resolved as of 02/16/2023 Secondary to flu.  Resolved.  Patient tolerating p.o. without any  diarrhea.  Influenza A Patient already had been on Tamiflu, she will continue this  Hypokalemia-resolved as of 02/16/2023 Replaced as needed  Hypertension associated with diabetes ([place]) Following fluid resuscitation, blood pressures started trending back upward.  Resumed on home medications  Type 2 diabetes mellitus with stage 3 chronic kidney disease (HCC) CBG stable  Overweight (BMI 25.0-29.9) Meets criteria BMI greater than 25         Consultants: None Procedures performed: Echocardiogram noting preserved ejection fraction and indeterminate diastolic function Disposition: Home with home health Diet recommendation:  Discharge Diet Orders (From admission, onward)     Start     Ordered   02/16/23 0000  Diet - low sodium heart healthy        02/16/23 1409           Low sodium DISCHARGE MEDICATION: Allergies as of 02/16/2023       Reactions   Sulfa Antibiotics Itching, Rash   Adhesive [tape] Dermatitis   Blisters the skin        Medication List     STOP taking these medications    aspirin EC 81 MG tablet   omeprazole 20 MG capsule Commonly known as: PRILOSEC   rivaroxaban 20 MG Tabs tablet Commonly known as: XARELTO       TAKE these medications    amLODipine 5 MG tablet Commonly known as: NORVASC Take 1 tablet by mouth daily.   Calcium 600+D Plus Minerals 600-400 MG-UNIT Tabs Take 1 tablet by mouth daily.   cholecalciferol 25 MCG (1000 UT) tablet Generic drug: Cholecalciferol Take 1,000 Units by mouth daily.  Eliquis 2.5 MG Tabs tablet Generic drug: apixaban Take 2.5 mg by mouth 2 (two) times daily.   furosemide 20 MG tablet Commonly known as: LASIX Take 20 mg by mouth daily.   gabapentin 600 MG tablet Commonly known as: NEURONTIN Take 600 mg by mouth 2 (two) times daily.   Jardiance 25 MG Tabs tablet Generic drug: empagliflozin Take 25 mg by mouth daily.   losartan 50 MG tablet Commonly known as: COZAAR Take 50 mg by mouth  daily.   mirtazapine 15 MG tablet Commonly known as: REMERON Take 15 mg by mouth at bedtime.   oseltamivir 30 MG capsule Commonly known as: TAMIFLU Take 30 mg by mouth 2 (two) times daily.   pantoprazole 40 MG tablet Commonly known as: PROTONIX Take 40 mg by mouth daily.   rOPINIRole 2 MG tablet Commonly known as: REQUIP Take 2 mg by mouth at bedtime.   rosuvastatin 5 MG tablet Commonly known as: CRESTOR Take 5 mg by mouth at bedtime.   Ventolin HFA 108 (90 Base) MCG/ACT inhaler Generic drug: albuterol Inhale 1 puff into the lungs every 6 (six) hours as needed for wheezing or shortness of breath.        Discharge Exam: Filed Weights   02/15/23 2154 02/16/23 0044  Weight: 75 kg 66.5 kg   General: Alert and oriented x 3, no acute distress Cardiovascular: Regular rate and rhythm, S1-S2  Condition at discharge: improving  The results of significant diagnostics from this hospitalization (including imaging, microbiology, ancillary and laboratory) are listed below for reference.   Imaging Studies: ECHOCARDIOGRAM COMPLETE  Result Date: 02/16/2023    ECHOCARDIOGRAM REPORT   Patient Name:   Anna Stone Date of Exam: 02/16/2023 Medical Rec #:  [id number]                    Height:       62.0 in Accession #:    [id number]                   Weight:       146.6 lb Date of Birth:  [birth year]                    BSA:          1.675 m Patient Age:    58 years                     BP:           172/66 mmHg Patient Gender: F                            HR:           67 bpm. Exam Location:  Inpatient Procedure: 2D Echo, Cardiac Doppler, Color Doppler and Strain Analysis Indications:    Syncope R55  History:        Patient has prior history of Echocardiogram examinations, most                 recent 09/19/2020. Risk Factors:Diabetes, Dyslipidemia and                 Hypertension.  Sonographer:    Luane School RDCS Referring Phys: [id number]  1. Left ventricular  ejection fraction, by estimation, is 65 to 70%. The left ventricle has normal function. The left ventricle has no regional wall motion abnormalities. There is  moderate asymmetric left ventricular hypertrophy of the basal-septal segment. Left ventricular diastolic parameters are indeterminate.  2. Right ventricular systolic function is normal. The right ventricular size is normal. There is mildly elevated pulmonary artery systolic pressure.  3. The mitral valve is normal in structure. Trivial mitral valve regurgitation.  4. The aortic valve is tricuspid. Aortic valve regurgitation is trivial. Aortic valve sclerosis/calcification is present, without any evidence of aortic stenosis. FINDINGS  Left Ventricle: Left ventricular ejection fraction, by estimation, is 65 to 70%. The left ventricle has normal function. The left ventricle has no regional wall motion abnormalities. Definity contrast agent was given IV to delineate the left ventricular  endocardial borders. The left ventricular internal cavity size was normal in size. There is moderate asymmetric left ventricular hypertrophy of the basal-septal segment. Left ventricular diastolic parameters are indeterminate. Right Ventricle: The right ventricular size is normal. No increase in right ventricular wall thickness. Right ventricular systolic function is normal. There is mildly elevated pulmonary artery systolic pressure. The tricuspid regurgitant velocity is 2.91  m/s, and with an assumed right atrial pressure of 3 mmHg, the estimated right ventricular systolic pressure is 123456 mmHg. Left Atrium: Left atrial size was normal in size. Right Atrium: Right atrial size was normal in size. Pericardium: There is no evidence of pericardial effusion. Mitral Valve: The mitral valve is normal in structure. Trivial mitral valve regurgitation. Tricuspid Valve: The tricuspid valve is normal in structure. Tricuspid valve regurgitation is mild. Aortic Valve: The aortic valve is  tricuspid. Aortic valve regurgitation is trivial. Aortic regurgitation PHT measures 534 msec. Aortic valve sclerosis/calcification is present, without any evidence of aortic stenosis. Pulmonic Valve: The pulmonic valve was not well visualized. Pulmonic valve regurgitation is trivial. Aorta: The aortic root and ascending aorta are structurally normal, with no evidence of dilitation. IAS/Shunts: The interatrial septum was not well visualized.  LEFT VENTRICLE PLAX 2D LVIDd:         4.20 cm   Diastology LVIDs:         3.00 cm   LV e' medial:    7.07 cm/s LV PW:         1.10 cm   LV E/e' medial:  15.7 LV IVS:        1.10 cm   LV e' lateral:   7.94 cm/s LVOT diam:     1.90 cm   LV E/e' lateral: 14.0 LV SV:         83 LV SV Index:   49 LVOT Area:     2.84 cm  RIGHT VENTRICLE             IVC RV S prime:     15.90 cm/s  IVC diam: 1.40 cm TAPSE (M-mode): 2.2 cm LEFT ATRIUM             Index        RIGHT ATRIUM           Index LA diam:        3.40 cm 2.03 cm/m   RA Area:     13.20 cm LA Vol (A2C):   49.1 ml 29.31 ml/m  RA Volume:   31.50 ml  18.80 ml/m LA Vol (A4C):   45.3 ml 27.04 ml/m LA Biplane Vol: 47.7 ml 28.47 ml/m  AORTIC VALVE LVOT Vmax:   128.00 cm/s LVOT Vmean:  87.500 cm/s LVOT VTI:    0.291 m AI PHT:      534 msec  AORTA Ao Root diam: 2.90  cm Ao Asc diam:  3.30 cm Ao Desc diam: 2.00 cm MITRAL VALVE                TRICUSPID VALVE MV Area (PHT): 2.80 cm     TR Peak grad:   33.9 mmHg MV Decel Time: 271 msec     TR Vmax:        291.00 cm/s MV E velocity: 111.00 cm/s MV A velocity: 134.00 cm/s  SHUNTS MV E/A ratio:  0.83         Systemic VTI:  0.29 m                             Systemic Diam: 1.90 cm Oswaldo Milian MD Electronically signed by Oswaldo Milian MD Signature Date/Time: 02/16/2023/1:37:46 PM    Final    VAS US CAROTID  Result Date: 02/16/2023 Carotid Arterial Duplex Study Patient Name:  [facility] Al[facility] Severance  Date of Exam:   02/16/2023 Medical Rec #: [id number]                      Accession #:    [id number] Date of Birth: 08-25-1941                     Patient Gender: F Patient Age:   48 years Exam Location:  [facility] Procedure:      VAS US CAROTID Referring Phys: Nyoka Lint DOUTOVA --------------------------------------------------------------------------------  Indications:       Syncope. Risk Factors:      Hypertension, hyperlipidemia, Diabetes, no history of                    smoking. Other Factors:     CKD. Comparison Study:  No previous exams Performing Technologist: Jody Hill RVT, RDMS  Examination Guidelines: A complete evaluation includes B-mode imaging, spectral Doppler, color Doppler, and power Doppler as needed of all accessible portions of each vessel. Bilateral testing is considered an integral part of a complete examination. Limited examinations for reoccurring indications may be performed as noted.  Right Carotid Findings: +----------+--------+--------+--------+------------------+------------------+           PSV cm/sEDV cm/sStenosisPlaque DescriptionComments           +----------+--------+--------+--------+------------------+------------------+ CCA Prox  95      15                                                   +----------+--------+--------+--------+------------------+------------------+ CCA Distal91      14                                intimal thickening +----------+--------+--------+--------+------------------+------------------+ ICA Prox  79      19                                                   +----------+--------+--------+--------+------------------+------------------+ ICA Distal93      25                                                   +----------+--------+--------+--------+------------------+------------------+  ECA       94                                                           +----------+--------+--------+--------+------------------+------------------+  +----------+--------+-------+----------------+-------------------+           PSV cm/sEDV cmsDescribe        Arm Pressure (mmHG) +----------+--------+-------+----------------+-------------------+ Subclavian102            Multiphasic, WNL                    +----------+--------+-------+----------------+-------------------+ +---------+--------+--+--------+--+---------+ VertebralPSV cm/s61EDV cm/s12Antegrade +---------+--------+--+--------+--+---------+  Left Carotid Findings: +----------+--------+--------+--------+-------------------+------------------+           PSV cm/sEDV cm/sStenosisPlaque Description Comments           +----------+--------+--------+--------+-------------------+------------------+ CCA Prox  85      17                                                    +----------+--------+--------+--------+-------------------+------------------+ CCA Distal102     21                                 intimal thickening +----------+--------+--------+--------+-------------------+------------------+ ICA Prox  96      26              calcific and smooth                   +----------+--------+--------+--------+-------------------+------------------+ ICA Distal80      16                                                    +----------+--------+--------+--------+-------------------+------------------+ ECA       130     0                                                     +----------+--------+--------+--------+-------------------+------------------+ +----------+--------+--------+----------------+-------------------+           PSV cm/sEDV cm/sDescribe        Arm Pressure (mmHG) +----------+--------+--------+----------------+-------------------+ Subclavian129             Multiphasic, WNL                    +----------+--------+--------+----------------+-------------------+ +---------+--------+--+--------+--+---------+ VertebralPSV cm/s47EDV  cm/s12Antegrade +---------+--------+--+--------+--+---------+   Summary: Right Carotid: The extracranial vessels were near-normal with only minimal wall                thickening or plaque. Left Carotid: The extracranial vessels were near-normal with only minimal wall               thickening or plaque. Vertebrals:  Bilateral vertebral arteries demonstrate antegrade flow. Subclavians: Normal flow hemodynamics were seen in bilateral subclavian              arteries. *See table(s) above for measurements  and observations.  Electronically signed by Monica Martinez MD on 02/16/2023 at 1:02:55 PM.    Final    CT T-SPINE NO CHARGE  Result Date: 02/15/2023 CLINICAL DATA:  Trauma EXAM: CT THORACIC SPINE WITHOUT CONTRAST TECHNIQUE: Multidetector CT images of the thoracic were obtained using the standard protocol without intravenous contrast. RADIATION DOSE REDUCTION: This exam was performed according to the departmental dose-optimization program which includes automated exposure control, adjustment of the mA and/or kV according to patient size and/or use of iterative reconstruction technique. COMPARISON:  None Available. FINDINGS: Alignment: Normal. Vertebrae: No acute fracture or focal pathologic process. Paraspinal and other soft tissues: Negative. Disc levels: Early anterior spurring in the mid to lower thoracic spine. No disc herniation. IMPRESSION: No acute bony abnormality. Electronically Signed   By: Rolm Baptise M.D.   On: 02/15/2023 23:19   CT L-SPINE NO CHARGE  Result Date: 02/15/2023 CLINICAL DATA:  Trauma EXAM: CT LUMBAR SPINE WITHOUT CONTRAST TECHNIQUE: Multidetector CT imaging of the lumbar spine was performed without intravenous contrast administration. Multiplanar CT image reconstructions were also generated. RADIATION DOSE REDUCTION: This exam was performed according to the departmental dose-optimization program which includes automated exposure control, adjustment of the mA and/or kV according to  patient size and/or use of iterative reconstruction technique. COMPARISON:  None Available. FINDINGS: Segmentation: 5 lumbar type vertebrae. Alignment: Slight anterolisthesis of L4 on L5 and L5 on S1 related to facet disease. Vertebrae: No acute fracture or focal pathologic process. Paraspinal and other soft tissues: Negative. Disc levels: No visible disc herniation. Disc spaces maintained. Degenerative facet disease in the lower lumbar spine. IMPRESSION: No acute bony abnormality. Electronically Signed   By: Rolm Baptise M.D.   On: 02/15/2023 23:18   CT CHEST ABDOMEN PELVIS WO CONTRAST  Result Date: 02/15/2023 CLINICAL DATA:  Polytrauma, blunt EXAM: CT CHEST, ABDOMEN AND PELVIS WITHOUT CONTRAST TECHNIQUE: Multidetector CT imaging of the chest, abdomen and pelvis was performed following the standard protocol without IV contrast. RADIATION DOSE REDUCTION: This exam was performed according to the departmental dose-optimization program which includes automated exposure control, adjustment of the mA and/or kV according to patient size and/or use of iterative reconstruction technique. COMPARISON:  None Available. FINDINGS: CT CHEST FINDINGS Cardiovascular: Heart is normal size. Moderate coronary artery and aortic calcifications. No aneurysm. Mediastinum/Nodes: No mediastinal, hilar, or axillary adenopathy. Trachea and esophagus are unremarkable. Thyroid unremarkable. Lungs/Pleura: No confluent opacities, effusions or pneumothorax. Musculoskeletal: Chest wall soft tissues are unremarkable. No acute bony abnormality. CT ABDOMEN PELVIS FINDINGS Hepatobiliary: 2 cm low-density lesion in the left hepatic lobe, likely cyst. 3 cm exophytic low-density lesion in the inferior right hepatic lobe, also likely cysts. Prior cholecystectomy. No evidence of a hepatic injury or perihepatic hematoma. Pancreas: No focal abnormality or ductal dilatation. Spleen: No splenic injury or perisplenic hematoma. Adrenals/Urinary Tract: No  adrenal hemorrhage or renal injury identified. Bladder is unremarkable. Stomach/Bowel: Stomach, large and small bowel grossly unremarkable. Vascular/Lymphatic: Diffuse aortic atherosclerosis. No evidence of aneurysm or adenopathy. Reproductive: Prior hysterectomy.  No adnexal masses. Other: No free fluid or free air. Musculoskeletal: Degenerative facet disease in the lower lumbar spine. No acute bony abnormality. IMPRESSION: No acute findings or evidence of significant traumatic injury in the chest, abdomen or pelvis. Aortic atherosclerosis, coronary artery disease. Electronically Signed   By: Rolm Baptise M.D.   On: 02/15/2023 23:16   CT CERVICAL SPINE WO CONTRAST  Result Date: 02/15/2023 CLINICAL DATA:  Polytrauma, blunt EXAM: CT CERVICAL SPINE WITHOUT CONTRAST TECHNIQUE: Multidetector CT imaging  of the cervical spine was performed without intravenous contrast. Multiplanar CT image reconstructions were also generated. RADIATION DOSE REDUCTION: This exam was performed according to the departmental dose-optimization program which includes automated exposure control, adjustment of the mA and/or kV according to patient size and/or use of iterative reconstruction technique. COMPARISON:  None Available. FINDINGS: Alignment: Normal Skull base and vertebrae: No acute fracture. No primary bone lesion or focal pathologic process. Soft tissues and spinal canal: No prevertebral fluid or swelling. No visible canal hematoma. Disc levels: Diffuse degenerative disc disease and facet disease. No disc herniation. Upper chest: No acute findings Other: None IMPRESSION: Degenerative disc and facet disease. No acute bony abnormality Electronically Signed   By: Rolm Baptise M.D.   On: 02/15/2023 23:13   CT HEAD WO CONTRAST  Result Date: 02/15/2023 CLINICAL DATA:  Head trauma, moderate-severe EXAM: CT HEAD WITHOUT CONTRAST TECHNIQUE: Contiguous axial images were obtained from the base of the skull through the vertex without  intravenous contrast. RADIATION DOSE REDUCTION: This exam was performed according to the departmental dose-optimization program which includes automated exposure control, adjustment of the mA and/or kV according to patient size and/or use of iterative reconstruction technique. COMPARISON:  10/11/2020 FINDINGS: Brain: There is atrophy and chronic small vessel disease changes. No acute intracranial abnormality. Specifically, no hemorrhage, hydrocephalus, mass lesion, acute infarction, or significant intracranial injury. Vascular: No hyperdense vessel or unexpected calcification. Skull: No acute calvarial abnormality. Sinuses/Orbits: No acute findings Other: None IMPRESSION: Atrophy, chronic microvascular disease. No acute intracranial abnormality. Electronically Signed   By: Rolm Baptise M.D.   On: 02/15/2023 23:12   DG Pelvis Portable  Result Date: 02/15/2023 CLINICAL DATA:  Trauma. EXAM: PORTABLE PELVIS 1-2 VIEWS COMPARISON:  None Available. FINDINGS: There is no acute fracture or dislocation. The bones are osteopenic. Degenerative changes of the hips and visualized lower lumbar spine. The soft tissues are unremarkable. IMPRESSION: No acute fracture or dislocation. Electronically Signed   By: Anner Crete M.D.   On: 02/15/2023 22:32   DG Knee Left Port  Result Date: 02/15/2023 CLINICAL DATA:  Recent fall with knee pain, initial encounter EXAM: PORTABLE LEFT KNEE - 2 VIEW COMPARISON:  08/16/2017 FINDINGS: Tricompartmental degenerative changes are noted. No acute fracture or dislocation is seen. No soft tissue abnormality is noted. IMPRESSION: Degenerative change without acute abnormality. Electronically Signed   By: Inez Catalina M.D.   On: 02/15/2023 22:30   DG Chest Port 1 View  Result Date: 02/15/2023 CLINICAL DATA:  Trauma EXAM: PORTABLE CHEST 1 VIEW COMPARISON:  05/01/2022 FINDINGS: Heart and mediastinal contours are within normal limits. No focal opacities or effusions. No acute bony abnormality.  IMPRESSION: No active cardiopulmonary disease. Electronically Signed   By: Rolm Baptise M.D.   On: 02/15/2023 22:29    Microbiology: Results for orders placed or performed during the hospital encounter of 02/15/23  Resp panel by RT-PCR (RSV, Flu A&B, Covid) Anterior Nasal Swab     Status: Abnormal   Collection Time: 02/15/23 10:39 PM   Specimen: Anterior Nasal Swab  Result Value Ref Range Status   SARS Coronavirus 2 by RT PCR NEGATIVE NEGATIVE Final   Influenza A by PCR POSITIVE (A) NEGATIVE Final   Influenza B by PCR NEGATIVE NEGATIVE Final    Comment: (NOTE) The Xpert Xpress SARS-CoV-2/FLU/RSV plus assay is intended as an aid in the diagnosis of influenza from Nasopharyngeal swab specimens and should not be used as a sole basis for treatment. Nasal washings and aspirates are unacceptable for Xpert  Xpress SARS-CoV-2/FLU/RSV testing.  Fact Sheet for Patients: EntrepreneurPulse.com.au  Fact Sheet for Healthcare Providers: IncredibleEmployment.be  This test is not yet approved or cleared by the [place] FDA and has been authorized for detection and/or diagnosis of SARS-CoV-2 by FDA under an Emergency Use Authorization (EUA). This EUA will remain in effect (meaning this test can be used) for the duration of the COVID-19 declaration under Section 564(b)(1) of the Act, 21 U.S.C. section 360bbb-3(b)(1), unless the authorization is terminated or revoked.     Resp Syncytial Virus by PCR NEGATIVE NEGATIVE Final    Comment: (NOTE) Fact Sheet for Patients: EntrepreneurPulse.com.au  Fact Sheet for Healthcare Providers: IncredibleEmployment.be  This test is not yet approved or cleared by the [place] FDA and has been authorized for detection and/or diagnosis of SARS-CoV-2 by FDA under an Emergency Use Authorization (EUA). This EUA will remain in effect (meaning this test can be used) for the duration of  the COVID-19 declaration under Section 564(b)(1) of the Act, 21 U.S.C. section 360bbb-3(b)(1), unless the authorization is terminated or revoked.  Performed at [facility], [place] [address] [place],  [address]     Labs: CBC: Recent Labs  Lab 02/15/23 2200 02/15/23 2213 02/16/23 0311  WBC 3.8*  --  2.7*  HGB 13.6 14.3 11.4*  HCT 42.2 42.0 34.9*  MCV 84.6  --  84.3  PLT 121*  --  97*   Basic Metabolic Panel: Recent Labs  Lab 02/15/23 2200 02/15/23 2213 02/16/23 0311  NA 140 140 139  K 2.8* 2.9* 3.6  CL 101 101 107  CO2 24  --  24  GLUCOSE 117* 112* 100*  BUN 24* 25* 20  CREATININE 1.44* 1.50* 1.16*  CALCIUM 8.8*  --  7.7*  MG 1.9  --  1.6*  PHOS  --   --  3.8   Liver Function Tests: Recent Labs  Lab 02/15/23 2200 02/16/23 0311  AST 29 21  ALT 14 11  ALKPHOS 61 48  BILITOT 0.7 0.6  PROT 7.2 5.8*  ALBUMIN 3.7 2.8*   CBG: Recent Labs  Lab 02/15/23 2159 02/16/23 0033 02/16/23 0816 02/16/23 1202  GLUCAP 112* 116* 83 108*    Discharge time spent: less than 30 minutes.  Signed: Annita Brod, MD [facility] 02/16/2023

## 2023-02-16 NOTE — Assessment & Plan Note (Addendum)
Restart Norvasc at 5 mg a day

## 2023-02-16 NOTE — Assessment & Plan Note (Signed)
Resolved with IV fluids

## 2023-02-17 LAB — HEMOGLOBIN A1C
Hgb A1c MFr Bld: 6.5 % — ABNORMAL HIGH (ref 4.8–5.6)
Mean Plasma Glucose: 140 mg/dL

## 2023-02-20 DIAGNOSIS — Z6825 Body mass index (BMI) 25.0-25.9, adult: Secondary | ICD-10-CM | POA: Diagnosis not present

## 2023-02-20 DIAGNOSIS — Z86718 Personal history of other venous thrombosis and embolism: Secondary | ICD-10-CM | POA: Diagnosis not present

## 2023-02-20 DIAGNOSIS — E785 Hyperlipidemia, unspecified: Secondary | ICD-10-CM | POA: Diagnosis not present

## 2023-02-20 DIAGNOSIS — J102 Influenza due to other identified influenza virus with gastrointestinal manifestations: Secondary | ICD-10-CM | POA: Diagnosis not present

## 2023-02-20 DIAGNOSIS — Z7984 Long term (current) use of oral hypoglycemic drugs: Secondary | ICD-10-CM | POA: Diagnosis not present

## 2023-02-20 DIAGNOSIS — N183 Chronic kidney disease, stage 3 unspecified: Secondary | ICD-10-CM | POA: Diagnosis not present

## 2023-02-20 DIAGNOSIS — E1159 Type 2 diabetes mellitus with other circulatory complications: Secondary | ICD-10-CM | POA: Diagnosis not present

## 2023-02-20 DIAGNOSIS — Z7901 Long term (current) use of anticoagulants: Secondary | ICD-10-CM | POA: Diagnosis not present

## 2023-02-20 DIAGNOSIS — I951 Orthostatic hypotension: Secondary | ICD-10-CM | POA: Diagnosis not present

## 2023-02-20 DIAGNOSIS — I152 Hypertension secondary to endocrine disorders: Secondary | ICD-10-CM | POA: Diagnosis not present

## 2023-02-20 DIAGNOSIS — E860 Dehydration: Secondary | ICD-10-CM | POA: Diagnosis not present

## 2023-02-20 DIAGNOSIS — Z9181 History of falling: Secondary | ICD-10-CM | POA: Diagnosis not present

## 2023-02-20 DIAGNOSIS — K589 Irritable bowel syndrome without diarrhea: Secondary | ICD-10-CM | POA: Diagnosis not present

## 2023-02-20 DIAGNOSIS — E1122 Type 2 diabetes mellitus with diabetic chronic kidney disease: Secondary | ICD-10-CM | POA: Diagnosis not present

## 2023-02-20 DIAGNOSIS — E663 Overweight: Secondary | ICD-10-CM | POA: Diagnosis not present

## 2023-02-21 DIAGNOSIS — E1122 Type 2 diabetes mellitus with diabetic chronic kidney disease: Secondary | ICD-10-CM | POA: Diagnosis not present

## 2023-02-21 DIAGNOSIS — I951 Orthostatic hypotension: Secondary | ICD-10-CM | POA: Diagnosis not present

## 2023-02-21 DIAGNOSIS — E1159 Type 2 diabetes mellitus with other circulatory complications: Secondary | ICD-10-CM | POA: Diagnosis not present

## 2023-02-21 DIAGNOSIS — E860 Dehydration: Secondary | ICD-10-CM | POA: Diagnosis not present

## 2023-02-21 DIAGNOSIS — N183 Chronic kidney disease, stage 3 unspecified: Secondary | ICD-10-CM | POA: Diagnosis not present

## 2023-02-21 DIAGNOSIS — J102 Influenza due to other identified influenza virus with gastrointestinal manifestations: Secondary | ICD-10-CM | POA: Diagnosis not present

## 2023-02-24 DIAGNOSIS — E876 Hypokalemia: Secondary | ICD-10-CM | POA: Diagnosis not present

## 2023-02-24 DIAGNOSIS — N1832 Chronic kidney disease, stage 3b: Secondary | ICD-10-CM | POA: Diagnosis not present

## 2023-02-24 DIAGNOSIS — R059 Cough, unspecified: Secondary | ICD-10-CM | POA: Diagnosis not present

## 2023-02-24 DIAGNOSIS — Z6827 Body mass index (BMI) 27.0-27.9, adult: Secondary | ICD-10-CM | POA: Diagnosis not present

## 2023-02-24 DIAGNOSIS — Z7689 Persons encountering health services in other specified circumstances: Secondary | ICD-10-CM | POA: Diagnosis not present

## 2023-02-25 DIAGNOSIS — E860 Dehydration: Secondary | ICD-10-CM | POA: Diagnosis not present

## 2023-02-25 DIAGNOSIS — N183 Chronic kidney disease, stage 3 unspecified: Secondary | ICD-10-CM | POA: Diagnosis not present

## 2023-02-25 DIAGNOSIS — J102 Influenza due to other identified influenza virus with gastrointestinal manifestations: Secondary | ICD-10-CM | POA: Diagnosis not present

## 2023-02-25 DIAGNOSIS — E1122 Type 2 diabetes mellitus with diabetic chronic kidney disease: Secondary | ICD-10-CM | POA: Diagnosis not present

## 2023-02-25 DIAGNOSIS — E1159 Type 2 diabetes mellitus with other circulatory complications: Secondary | ICD-10-CM | POA: Diagnosis not present

## 2023-02-25 DIAGNOSIS — I951 Orthostatic hypotension: Secondary | ICD-10-CM | POA: Diagnosis not present

## 2023-02-26 DIAGNOSIS — E860 Dehydration: Secondary | ICD-10-CM | POA: Diagnosis not present

## 2023-02-26 DIAGNOSIS — J102 Influenza due to other identified influenza virus with gastrointestinal manifestations: Secondary | ICD-10-CM | POA: Diagnosis not present

## 2023-02-26 DIAGNOSIS — E1159 Type 2 diabetes mellitus with other circulatory complications: Secondary | ICD-10-CM | POA: Diagnosis not present

## 2023-02-26 DIAGNOSIS — I951 Orthostatic hypotension: Secondary | ICD-10-CM | POA: Diagnosis not present

## 2023-02-26 DIAGNOSIS — E1122 Type 2 diabetes mellitus with diabetic chronic kidney disease: Secondary | ICD-10-CM | POA: Diagnosis not present

## 2023-02-26 DIAGNOSIS — N183 Chronic kidney disease, stage 3 unspecified: Secondary | ICD-10-CM | POA: Diagnosis not present

## 2023-02-27 DIAGNOSIS — J102 Influenza due to other identified influenza virus with gastrointestinal manifestations: Secondary | ICD-10-CM | POA: Diagnosis not present

## 2023-02-27 DIAGNOSIS — E860 Dehydration: Secondary | ICD-10-CM | POA: Diagnosis not present

## 2023-02-27 DIAGNOSIS — E1122 Type 2 diabetes mellitus with diabetic chronic kidney disease: Secondary | ICD-10-CM | POA: Diagnosis not present

## 2023-02-27 DIAGNOSIS — N183 Chronic kidney disease, stage 3 unspecified: Secondary | ICD-10-CM | POA: Diagnosis not present

## 2023-02-27 DIAGNOSIS — I951 Orthostatic hypotension: Secondary | ICD-10-CM | POA: Diagnosis not present

## 2023-02-27 DIAGNOSIS — E1159 Type 2 diabetes mellitus with other circulatory complications: Secondary | ICD-10-CM | POA: Diagnosis not present

## 2023-03-03 DIAGNOSIS — E1159 Type 2 diabetes mellitus with other circulatory complications: Secondary | ICD-10-CM | POA: Diagnosis not present

## 2023-03-03 DIAGNOSIS — E1122 Type 2 diabetes mellitus with diabetic chronic kidney disease: Secondary | ICD-10-CM | POA: Diagnosis not present

## 2023-03-03 DIAGNOSIS — E860 Dehydration: Secondary | ICD-10-CM | POA: Diagnosis not present

## 2023-03-03 DIAGNOSIS — J102 Influenza due to other identified influenza virus with gastrointestinal manifestations: Secondary | ICD-10-CM | POA: Diagnosis not present

## 2023-03-03 DIAGNOSIS — N183 Chronic kidney disease, stage 3 unspecified: Secondary | ICD-10-CM | POA: Diagnosis not present

## 2023-03-03 DIAGNOSIS — I951 Orthostatic hypotension: Secondary | ICD-10-CM | POA: Diagnosis not present

## 2023-03-05 DIAGNOSIS — E1122 Type 2 diabetes mellitus with diabetic chronic kidney disease: Secondary | ICD-10-CM | POA: Diagnosis not present

## 2023-03-05 DIAGNOSIS — I951 Orthostatic hypotension: Secondary | ICD-10-CM | POA: Diagnosis not present

## 2023-03-05 DIAGNOSIS — N183 Chronic kidney disease, stage 3 unspecified: Secondary | ICD-10-CM | POA: Diagnosis not present

## 2023-03-05 DIAGNOSIS — E1159 Type 2 diabetes mellitus with other circulatory complications: Secondary | ICD-10-CM | POA: Diagnosis not present

## 2023-03-05 DIAGNOSIS — J102 Influenza due to other identified influenza virus with gastrointestinal manifestations: Secondary | ICD-10-CM | POA: Diagnosis not present

## 2023-03-05 DIAGNOSIS — E860 Dehydration: Secondary | ICD-10-CM | POA: Diagnosis not present

## 2023-03-06 DIAGNOSIS — I951 Orthostatic hypotension: Secondary | ICD-10-CM | POA: Diagnosis not present

## 2023-03-06 DIAGNOSIS — E860 Dehydration: Secondary | ICD-10-CM | POA: Diagnosis not present

## 2023-03-06 DIAGNOSIS — N183 Chronic kidney disease, stage 3 unspecified: Secondary | ICD-10-CM | POA: Diagnosis not present

## 2023-03-06 DIAGNOSIS — J102 Influenza due to other identified influenza virus with gastrointestinal manifestations: Secondary | ICD-10-CM | POA: Diagnosis not present

## 2023-03-06 DIAGNOSIS — E1159 Type 2 diabetes mellitus with other circulatory complications: Secondary | ICD-10-CM | POA: Diagnosis not present

## 2023-03-06 DIAGNOSIS — E1122 Type 2 diabetes mellitus with diabetic chronic kidney disease: Secondary | ICD-10-CM | POA: Diagnosis not present

## 2023-03-11 DIAGNOSIS — J102 Influenza due to other identified influenza virus with gastrointestinal manifestations: Secondary | ICD-10-CM | POA: Diagnosis not present

## 2023-03-11 DIAGNOSIS — I951 Orthostatic hypotension: Secondary | ICD-10-CM | POA: Diagnosis not present

## 2023-03-11 DIAGNOSIS — N183 Chronic kidney disease, stage 3 unspecified: Secondary | ICD-10-CM | POA: Diagnosis not present

## 2023-03-11 DIAGNOSIS — E860 Dehydration: Secondary | ICD-10-CM | POA: Diagnosis not present

## 2023-03-11 DIAGNOSIS — E1122 Type 2 diabetes mellitus with diabetic chronic kidney disease: Secondary | ICD-10-CM | POA: Diagnosis not present

## 2023-03-11 DIAGNOSIS — E1159 Type 2 diabetes mellitus with other circulatory complications: Secondary | ICD-10-CM | POA: Diagnosis not present

## 2023-03-17 DIAGNOSIS — J9610 Chronic respiratory failure, unspecified whether with hypoxia or hypercapnia: Secondary | ICD-10-CM | POA: Diagnosis not present

## 2023-03-17 DIAGNOSIS — J449 Chronic obstructive pulmonary disease, unspecified: Secondary | ICD-10-CM | POA: Diagnosis not present

## 2023-03-18 ENCOUNTER — Ambulatory Visit (INDEPENDENT_AMBULATORY_CARE_PROVIDER_SITE_OTHER): Payer: Medicare Other | Admitting: Podiatry

## 2023-03-18 DIAGNOSIS — E1140 Type 2 diabetes mellitus with diabetic neuropathy, unspecified: Secondary | ICD-10-CM

## 2023-03-18 DIAGNOSIS — M2142 Flat foot [pes planus] (acquired), left foot: Secondary | ICD-10-CM

## 2023-03-18 DIAGNOSIS — M79675 Pain in left toe(s): Secondary | ICD-10-CM

## 2023-03-18 DIAGNOSIS — B351 Tinea unguium: Secondary | ICD-10-CM

## 2023-03-18 DIAGNOSIS — I739 Peripheral vascular disease, unspecified: Secondary | ICD-10-CM

## 2023-03-18 DIAGNOSIS — M2141 Flat foot [pes planus] (acquired), right foot: Secondary | ICD-10-CM

## 2023-03-18 DIAGNOSIS — M79674 Pain in right toe(s): Secondary | ICD-10-CM

## 2023-03-18 NOTE — Progress Notes (Signed)
  Subjective:  Patient ID: Anna Stone, female    DOB: 08/13/1941,  MRN: [id number]  Chief Complaint  Patient presents with   Diabetes    Diabetic foot care, A1c- 5.2 BG- Not taking Nail trim, Diabetic shoes     82 y.o. female presents with the above complaint. History confirmed with patient.  Patient presents the office today for concern for painful thickened elongated nails present on both feet.  She is unable to trim them.  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  Objective:  Physical Exam: warm, good capillary refill, nail exam onychomycosis of the toenails, onycholysis, and dystrophic nails, no trophic changes or ulcerative lesions. DP pulses palpable, PT pulses palpable, and protective sensation absent Left Foot: normal exam, no swelling, tenderness, instability; ligaments intact, full range of motion of all ankle/foot joints  Right Foot: normal exam, no swelling, tenderness, instability; ligaments intact, full range of motion of all ankle/foot joints   No images are attached to the encounter.  Assessment:   1. Pain due to onychomycosis of toenails of both feet   2. Pes planus of both feet   3. PVD (peripheral vascular disease)   4. Type 2 diabetes mellitus with diabetic neuropathy, unspecified whether long term insulin use        Plan:  Patient was evaluated and treated and all questions answered.  Onychomycosis with pain  -Nails palliatively debrided as below. -Educated on self-care  Procedure: Nail Debridement Rationale: Pain Type of Debridement: manual, sharp debridement. Instrumentation: Nail nipper, rotary burr. Number of Nails: 10  Return in about 3 months (around 06/17/2023) for [facility].         Everitt Amber, DPM [facility]

## 2023-03-25 ENCOUNTER — Other Ambulatory Visit: Payer: Medicare Other

## 2023-03-26 DIAGNOSIS — E1169 Type 2 diabetes mellitus with other specified complication: Secondary | ICD-10-CM | POA: Diagnosis not present

## 2023-03-26 DIAGNOSIS — E1122 Type 2 diabetes mellitus with diabetic chronic kidney disease: Secondary | ICD-10-CM | POA: Diagnosis not present

## 2023-03-27 DIAGNOSIS — M170 Bilateral primary osteoarthritis of knee: Secondary | ICD-10-CM | POA: Diagnosis not present

## 2023-04-03 DIAGNOSIS — E1169 Type 2 diabetes mellitus with other specified complication: Secondary | ICD-10-CM | POA: Diagnosis not present

## 2023-04-03 DIAGNOSIS — E782 Mixed hyperlipidemia: Secondary | ICD-10-CM | POA: Diagnosis not present

## 2023-04-03 DIAGNOSIS — I129 Hypertensive chronic kidney disease with stage 1 through stage 4 chronic kidney disease, or unspecified chronic kidney disease: Secondary | ICD-10-CM | POA: Diagnosis not present

## 2023-04-03 DIAGNOSIS — Z6828 Body mass index (BMI) 28.0-28.9, adult: Secondary | ICD-10-CM | POA: Diagnosis not present

## 2023-04-03 DIAGNOSIS — E1122 Type 2 diabetes mellitus with diabetic chronic kidney disease: Secondary | ICD-10-CM | POA: Diagnosis not present

## 2023-04-03 DIAGNOSIS — N183 Chronic kidney disease, stage 3 unspecified: Secondary | ICD-10-CM | POA: Diagnosis not present

## 2023-04-16 DIAGNOSIS — D6869 Other thrombophilia: Secondary | ICD-10-CM | POA: Diagnosis not present

## 2023-04-16 DIAGNOSIS — J449 Chronic obstructive pulmonary disease, unspecified: Secondary | ICD-10-CM | POA: Diagnosis not present

## 2023-05-17 DIAGNOSIS — J9610 Chronic respiratory failure, unspecified whether with hypoxia or hypercapnia: Secondary | ICD-10-CM | POA: Diagnosis not present

## 2023-05-17 DIAGNOSIS — E1169 Type 2 diabetes mellitus with other specified complication: Secondary | ICD-10-CM | POA: Diagnosis not present

## 2023-06-16 ENCOUNTER — Ambulatory Visit (INDEPENDENT_AMBULATORY_CARE_PROVIDER_SITE_OTHER): Payer: Medicare Other | Admitting: Podiatry

## 2023-06-16 DIAGNOSIS — B351 Tinea unguium: Secondary | ICD-10-CM

## 2023-06-16 DIAGNOSIS — I739 Peripheral vascular disease, unspecified: Secondary | ICD-10-CM

## 2023-06-16 DIAGNOSIS — M79675 Pain in left toe(s): Secondary | ICD-10-CM | POA: Diagnosis not present

## 2023-06-16 DIAGNOSIS — M2141 Flat foot [pes planus] (acquired), right foot: Secondary | ICD-10-CM

## 2023-06-16 DIAGNOSIS — J9610 Chronic respiratory failure, unspecified whether with hypoxia or hypercapnia: Secondary | ICD-10-CM | POA: Diagnosis not present

## 2023-06-16 DIAGNOSIS — E1140 Type 2 diabetes mellitus with diabetic neuropathy, unspecified: Secondary | ICD-10-CM

## 2023-06-16 DIAGNOSIS — M79674 Pain in right toe(s): Secondary | ICD-10-CM | POA: Diagnosis not present

## 2023-06-16 DIAGNOSIS — M2142 Flat foot [pes planus] (acquired), left foot: Secondary | ICD-10-CM

## 2023-06-16 DIAGNOSIS — E1169 Type 2 diabetes mellitus with other specified complication: Secondary | ICD-10-CM | POA: Diagnosis not present

## 2023-06-16 NOTE — Progress Notes (Signed)
  Subjective:  Patient ID: Anna Stone, female    DOB: 12-13-1941,  MRN: [id number]  Chief Complaint  Patient presents with   Nail Problem    Diabetic Foot Care- Nail trim     82 y.o. female presents with the above complaint. History confirmed with patient.  Patient presents the office today for concern for painful thickened elongated nails present on both feet.  She is unable to trim them.  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  Objective:  Physical Exam: warm, good capillary refill, nail exam onychomycosis of the toenails, onycholysis, and dystrophic nails, no trophic changes or ulcerative lesions. DP pulses palpable, PT pulses palpable, and protective sensation absent Left Foot: normal exam, no swelling, tenderness, instability; ligaments intact, full range of motion of all ankle/foot joints  Right Foot: normal exam, no swelling, tenderness, instability; ligaments intact, full range of motion of all ankle/foot joints   No images are attached to the encounter.  Assessment:   1. Pain due to onychomycosis of toenails of both feet   2. Pes planus of both feet   3. PVD (peripheral vascular disease) (HCC)   4. Type 2 diabetes mellitus with diabetic neuropathy, unspecified whether long term insulin use (HCC)      Plan:  Patient was evaluated and treated and all questions answered.  Onychomycosis with pain  -Nails palliatively debrided as below. -Educated on self-care  Procedure: Nail Debridement Rationale: Pain Type of Debridement: manual, sharp debridement. Instrumentation: Nail nipper, rotary burr. Number of Nails: 10  Return in about 3 months (around 09/16/2023) for [facility].         Corinna Gab, DPM [facility] / [facility]

## 2023-07-22 DIAGNOSIS — U071 COVID-19: Secondary | ICD-10-CM | POA: Diagnosis not present

## 2023-08-04 DIAGNOSIS — E1169 Type 2 diabetes mellitus with other specified complication: Secondary | ICD-10-CM | POA: Diagnosis not present

## 2023-08-04 DIAGNOSIS — E1122 Type 2 diabetes mellitus with diabetic chronic kidney disease: Secondary | ICD-10-CM | POA: Diagnosis not present

## 2023-08-11 DIAGNOSIS — H35372 Puckering of macula, left eye: Secondary | ICD-10-CM | POA: Diagnosis not present

## 2023-08-11 DIAGNOSIS — E119 Type 2 diabetes mellitus without complications: Secondary | ICD-10-CM | POA: Diagnosis not present

## 2023-08-14 DIAGNOSIS — E1169 Type 2 diabetes mellitus with other specified complication: Secondary | ICD-10-CM | POA: Diagnosis not present

## 2023-08-14 DIAGNOSIS — E782 Mixed hyperlipidemia: Secondary | ICD-10-CM | POA: Diagnosis not present

## 2023-08-14 DIAGNOSIS — I129 Hypertensive chronic kidney disease with stage 1 through stage 4 chronic kidney disease, or unspecified chronic kidney disease: Secondary | ICD-10-CM | POA: Diagnosis not present

## 2023-08-14 DIAGNOSIS — E1122 Type 2 diabetes mellitus with diabetic chronic kidney disease: Secondary | ICD-10-CM | POA: Diagnosis not present

## 2023-08-14 DIAGNOSIS — Z6829 Body mass index (BMI) 29.0-29.9, adult: Secondary | ICD-10-CM | POA: Diagnosis not present

## 2023-08-17 DIAGNOSIS — E1169 Type 2 diabetes mellitus with other specified complication: Secondary | ICD-10-CM | POA: Diagnosis not present

## 2023-08-17 DIAGNOSIS — J9610 Chronic respiratory failure, unspecified whether with hypoxia or hypercapnia: Secondary | ICD-10-CM | POA: Diagnosis not present

## 2023-08-21 DIAGNOSIS — Z23 Encounter for immunization: Secondary | ICD-10-CM | POA: Diagnosis not present

## 2023-09-16 ENCOUNTER — Telehealth: Payer: Self-pay | Admitting: Podiatry

## 2023-09-16 ENCOUNTER — Ambulatory Visit (INDEPENDENT_AMBULATORY_CARE_PROVIDER_SITE_OTHER): Payer: Medicare Other | Admitting: Podiatry

## 2023-09-16 DIAGNOSIS — M79674 Pain in right toe(s): Secondary | ICD-10-CM | POA: Diagnosis not present

## 2023-09-16 DIAGNOSIS — E1169 Type 2 diabetes mellitus with other specified complication: Secondary | ICD-10-CM | POA: Diagnosis not present

## 2023-09-16 DIAGNOSIS — B351 Tinea unguium: Secondary | ICD-10-CM

## 2023-09-16 DIAGNOSIS — M79675 Pain in left toe(s): Secondary | ICD-10-CM | POA: Diagnosis not present

## 2023-09-16 DIAGNOSIS — J9610 Chronic respiratory failure, unspecified whether with hypoxia or hypercapnia: Secondary | ICD-10-CM | POA: Diagnosis not present

## 2023-09-16 DIAGNOSIS — E1140 Type 2 diabetes mellitus with diabetic neuropathy, unspecified: Secondary | ICD-10-CM

## 2023-09-16 DIAGNOSIS — I739 Peripheral vascular disease, unspecified: Secondary | ICD-10-CM

## 2023-09-16 NOTE — Progress Notes (Signed)
Subjective:  Patient ID: Anna Stone, female    DOB: 1941-03-22,  MRN: [id number]  Chief Complaint  Patient presents with   Nail Problem    Diabetic Foot Care-nail trim     82 y.o. female presents with the above complaint. History confirmed with patient.  Patient presents the office today for concern for painful thickened elongated nails present on both feet.  She is unable to trim them.  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  Objective:  Physical Exam: warm, good capillary refill, nail exam onychomycosis of the toenails, onycholysis, and dystrophic nails, no trophic changes or ulcerative lesions. DP pulses palpable, PT pulses palpable, and protective sensation absent Left Foot: normal exam, no swelling, tenderness, instability; ligaments intact, full range of motion of all ankle/foot joints  Right Foot: normal exam, no swelling, tenderness, instability; ligaments intact, full range of motion of all ankle/foot joints   No images are attached to the encounter.  Assessment:   1. Pain due to onychomycosis of toenails of both feet   2. PVD (peripheral vascular disease) (HCC)   3. Type 2 diabetes mellitus with diabetic neuropathy, unspecified whether long term insulin use (HCC)       Plan:  Patient was evaluated and treated and all questions answered.  Onychomycosis with pain  -Nails palliatively debrided as below. -Educated on self-care  Procedure: Nail Debridement Rationale: Pain Type of Debridement: manual, sharp debridement. Instrumentation: Nail nipper, rotary burr. Number of Nails: 10  Return in about 3 months (around 12/17/2023) for [facility].         Corinna Gab, DPM [facility] / [facility]

## 2023-09-16 NOTE — Telephone Encounter (Signed)
Pt had an appt today and wanted to check up on diabetic shoes order that she was measured for back in 4/2.

## 2023-10-17 DIAGNOSIS — E1169 Type 2 diabetes mellitus with other specified complication: Secondary | ICD-10-CM | POA: Diagnosis not present

## 2023-10-17 DIAGNOSIS — J9610 Chronic respiratory failure, unspecified whether with hypoxia or hypercapnia: Secondary | ICD-10-CM | POA: Diagnosis not present

## 2023-10-23 DIAGNOSIS — Z86718 Personal history of other venous thrombosis and embolism: Secondary | ICD-10-CM | POA: Diagnosis not present

## 2023-10-23 DIAGNOSIS — Z6829 Body mass index (BMI) 29.0-29.9, adult: Secondary | ICD-10-CM | POA: Diagnosis not present

## 2023-10-23 DIAGNOSIS — J309 Allergic rhinitis, unspecified: Secondary | ICD-10-CM | POA: Diagnosis not present

## 2023-10-23 DIAGNOSIS — D6869 Other thrombophilia: Secondary | ICD-10-CM | POA: Diagnosis not present

## 2023-10-28 DIAGNOSIS — Z139 Encounter for screening, unspecified: Secondary | ICD-10-CM | POA: Diagnosis not present

## 2023-10-28 DIAGNOSIS — Z1389 Encounter for screening for other disorder: Secondary | ICD-10-CM | POA: Diagnosis not present

## 2023-10-28 DIAGNOSIS — Z0000 Encounter for general adult medical examination without abnormal findings: Secondary | ICD-10-CM | POA: Diagnosis not present

## 2023-10-28 DIAGNOSIS — Z136 Encounter for screening for cardiovascular disorders: Secondary | ICD-10-CM | POA: Diagnosis not present

## 2023-10-28 DIAGNOSIS — Z1339 Encounter for screening examination for other mental health and behavioral disorders: Secondary | ICD-10-CM | POA: Diagnosis not present

## 2023-10-28 DIAGNOSIS — Z1331 Encounter for screening for depression: Secondary | ICD-10-CM | POA: Diagnosis not present

## 2023-10-28 DIAGNOSIS — Z6829 Body mass index (BMI) 29.0-29.9, adult: Secondary | ICD-10-CM | POA: Diagnosis not present

## 2023-11-16 DIAGNOSIS — J9610 Chronic respiratory failure, unspecified whether with hypoxia or hypercapnia: Secondary | ICD-10-CM | POA: Diagnosis not present

## 2023-11-16 DIAGNOSIS — E1169 Type 2 diabetes mellitus with other specified complication: Secondary | ICD-10-CM | POA: Diagnosis not present

## 2023-12-15 DIAGNOSIS — E1122 Type 2 diabetes mellitus with diabetic chronic kidney disease: Secondary | ICD-10-CM | POA: Diagnosis not present

## 2023-12-15 DIAGNOSIS — E1169 Type 2 diabetes mellitus with other specified complication: Secondary | ICD-10-CM | POA: Diagnosis not present

## 2023-12-17 DIAGNOSIS — J9610 Chronic respiratory failure, unspecified whether with hypoxia or hypercapnia: Secondary | ICD-10-CM | POA: Diagnosis not present

## 2023-12-17 DIAGNOSIS — E1169 Type 2 diabetes mellitus with other specified complication: Secondary | ICD-10-CM | POA: Diagnosis not present

## 2023-12-23 ENCOUNTER — Ambulatory Visit (INDEPENDENT_AMBULATORY_CARE_PROVIDER_SITE_OTHER): Payer: Medicare Other | Admitting: Podiatry

## 2023-12-23 DIAGNOSIS — M79675 Pain in left toe(s): Secondary | ICD-10-CM | POA: Diagnosis not present

## 2023-12-23 DIAGNOSIS — B351 Tinea unguium: Secondary | ICD-10-CM | POA: Diagnosis not present

## 2023-12-23 DIAGNOSIS — E1140 Type 2 diabetes mellitus with diabetic neuropathy, unspecified: Secondary | ICD-10-CM

## 2023-12-23 DIAGNOSIS — M79674 Pain in right toe(s): Secondary | ICD-10-CM | POA: Diagnosis not present

## 2023-12-23 NOTE — Progress Notes (Signed)
  Subjective:  Patient ID: Anna Stone, female    DOB: 1941/07/25,  MRN: [id number]  Chief Complaint  Patient presents with   [facility]    DFC, A1c 6.5, eliquis .     83 y.o. female presents with the above complaint. History confirmed with patient.  Patient presents the office today for concern for painful thickened elongated nails present on both feet.  She is unable to trim them.  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  Objective:  Physical Exam: warm, good capillary refill, nail exam onychomycosis of the toenails, onycholysis, and dystrophic nails, no trophic changes or ulcerative lesions. DP pulses palpable, PT pulses palpable, and protective sensation absent Left Foot: normal exam, no swelling, tenderness, instability; ligaments intact, full range of motion of all ankle/foot joints  Right Foot: normal exam, no swelling, tenderness, instability; ligaments intact, full range of motion of all ankle/foot joints   No images are attached to the encounter.  Assessment:   1. Pain due to onychomycosis of toenails of both feet   2. Type 2 diabetes mellitus with diabetic neuropathy, unspecified whether long term insulin  use (HCC)       Plan:  Patient was evaluated and treated and all questions answered.  Onychomycosis with pain  -Nails palliatively debrided as below. -Educated on self-care  Procedure: Nail Debridement Rationale: Pain Type of Debridement: manual, sharp debridement. Instrumentation: Nail nipper, rotary burr. Number of Nails: 10  Return in about 3 months (around 03/22/2024) for Diabetic Foot Care.         Ethan Saddler, DPM [facility] / [facility]

## 2024-01-06 DIAGNOSIS — N183 Chronic kidney disease, stage 3 unspecified: Secondary | ICD-10-CM | POA: Diagnosis not present

## 2024-01-06 DIAGNOSIS — E1169 Type 2 diabetes mellitus with other specified complication: Secondary | ICD-10-CM | POA: Diagnosis not present

## 2024-01-06 DIAGNOSIS — E782 Mixed hyperlipidemia: Secondary | ICD-10-CM | POA: Diagnosis not present

## 2024-01-06 DIAGNOSIS — Z6829 Body mass index (BMI) 29.0-29.9, adult: Secondary | ICD-10-CM | POA: Diagnosis not present

## 2024-01-06 DIAGNOSIS — F339 Major depressive disorder, recurrent, unspecified: Secondary | ICD-10-CM | POA: Diagnosis not present

## 2024-01-06 DIAGNOSIS — E1122 Type 2 diabetes mellitus with diabetic chronic kidney disease: Secondary | ICD-10-CM | POA: Diagnosis not present

## 2024-01-06 DIAGNOSIS — I129 Hypertensive chronic kidney disease with stage 1 through stage 4 chronic kidney disease, or unspecified chronic kidney disease: Secondary | ICD-10-CM | POA: Diagnosis not present

## 2024-01-17 DIAGNOSIS — J9610 Chronic respiratory failure, unspecified whether with hypoxia or hypercapnia: Secondary | ICD-10-CM | POA: Diagnosis not present

## 2024-01-17 DIAGNOSIS — E1169 Type 2 diabetes mellitus with other specified complication: Secondary | ICD-10-CM | POA: Diagnosis not present

## 2024-02-14 DIAGNOSIS — E1169 Type 2 diabetes mellitus with other specified complication: Secondary | ICD-10-CM | POA: Diagnosis not present

## 2024-02-14 DIAGNOSIS — J9610 Chronic respiratory failure, unspecified whether with hypoxia or hypercapnia: Secondary | ICD-10-CM | POA: Diagnosis not present

## 2024-03-11 DIAGNOSIS — M170 Bilateral primary osteoarthritis of knee: Secondary | ICD-10-CM | POA: Diagnosis not present

## 2024-03-16 DIAGNOSIS — D692 Other nonthrombocytopenic purpura: Secondary | ICD-10-CM | POA: Diagnosis not present

## 2024-03-16 DIAGNOSIS — E1169 Type 2 diabetes mellitus with other specified complication: Secondary | ICD-10-CM | POA: Diagnosis not present

## 2024-03-22 ENCOUNTER — Ambulatory Visit (INDEPENDENT_AMBULATORY_CARE_PROVIDER_SITE_OTHER): Payer: Medicare Other | Admitting: Podiatry

## 2024-03-22 DIAGNOSIS — B351 Tinea unguium: Secondary | ICD-10-CM | POA: Diagnosis not present

## 2024-03-22 DIAGNOSIS — M79674 Pain in right toe(s): Secondary | ICD-10-CM

## 2024-03-22 DIAGNOSIS — E1140 Type 2 diabetes mellitus with diabetic neuropathy, unspecified: Secondary | ICD-10-CM

## 2024-03-22 DIAGNOSIS — M79675 Pain in left toe(s): Secondary | ICD-10-CM | POA: Diagnosis not present

## 2024-03-22 NOTE — Progress Notes (Unsigned)
  Subjective:  Patient ID: Anna Stone, female    DOB: 02-05-1941,  MRN: [id number]  Chief Complaint  Patient presents with   [facility]    DFCwith out callous. Last A1c was 3 weeks ago it was 6.5 and takes elliquis    83 y.o. female presents with the above complaint. History confirmed with patient.  Patient presents the office today for concern for painful thickened elongated nails present on both feet.  She is unable to trim them.  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  She is currently taking gabapentin 600 mg twice daily from another provider and ropinirole.  Objective:  Physical Exam: warm, good capillary refill, nail exam onychomycosis of the toenails, onycholysis, and dystrophic nails, no trophic changes or ulcerative lesions. DP pulses palpable, PT pulses palpable, and protective sensation absent Left Foot: normal exam, no swelling, tenderness, instability; ligaments intact, full range of motion of all ankle/foot joints  Right Foot: normal exam, no swelling, tenderness, instability; ligaments intact, full range of motion of all ankle/foot joints  Nail plates x 10 on both feet are painful with direct dorsal palpation  No images are attached to the encounter.  Assessment:   1. Pain due to onychomycosis of toenails of both feet   2. Type 2 diabetes mellitus with diabetic neuropathy, unspecified whether long term insulin use (HCC)   3. PVD (peripheral vascular disease) (HCC)       Plan:  Patient was evaluated and treated and all questions answered.  Onychomycosis with pain  -Nails palliatively debrided as below. -Educated on self-care  Procedure: Nail Debridement Rationale: Pain Type of Debridement: manual, sharp debridement. Instrumentation: Nail nipper, rotary burr. Number of Nails: 10  Patient educated on diabetes. Discussed proper diabetic foot care and discussed risks and complications of disease. Educated patient in depth on reasons to  return to the office immediately should he/she discover anything concerning or new on the feet. All questions answered. Discussed proper shoes as well.  Did briefly discuss neuropathy treatment today.  Did briefly discuss Qutenza, states that she will want to consider this further as she is unsure about cost.  In the meantime did recommend over-the-counter capsaicin cream as this may help alleviate some neuropathic pain.  Proper application discussed with patient. -I certify that this diagnosis represents a distinct and separate diagnosis that requires evaluation and treatment separate from other procedures or diagnosis   Return in about 3 months (around 06/21/2024) for Diabetic Foot Care.         Bronwen Betters, DPM [facility] / [facility]

## 2024-03-23 ENCOUNTER — Encounter: Payer: Self-pay | Admitting: Podiatry

## 2024-04-15 DIAGNOSIS — E1169 Type 2 diabetes mellitus with other specified complication: Secondary | ICD-10-CM | POA: Diagnosis not present

## 2024-04-15 DIAGNOSIS — D692 Other nonthrombocytopenic purpura: Secondary | ICD-10-CM | POA: Diagnosis not present

## 2024-04-21 DIAGNOSIS — E1169 Type 2 diabetes mellitus with other specified complication: Secondary | ICD-10-CM | POA: Diagnosis not present

## 2024-04-21 DIAGNOSIS — E1122 Type 2 diabetes mellitus with diabetic chronic kidney disease: Secondary | ICD-10-CM | POA: Diagnosis not present

## 2024-05-06 DIAGNOSIS — I129 Hypertensive chronic kidney disease with stage 1 through stage 4 chronic kidney disease, or unspecified chronic kidney disease: Secondary | ICD-10-CM | POA: Diagnosis not present

## 2024-05-06 DIAGNOSIS — E1169 Type 2 diabetes mellitus with other specified complication: Secondary | ICD-10-CM | POA: Diagnosis not present

## 2024-05-06 DIAGNOSIS — E1122 Type 2 diabetes mellitus with diabetic chronic kidney disease: Secondary | ICD-10-CM | POA: Diagnosis not present

## 2024-05-06 DIAGNOSIS — E782 Mixed hyperlipidemia: Secondary | ICD-10-CM | POA: Diagnosis not present

## 2024-05-06 DIAGNOSIS — J449 Chronic obstructive pulmonary disease, unspecified: Secondary | ICD-10-CM | POA: Diagnosis not present

## 2024-05-06 DIAGNOSIS — Z6829 Body mass index (BMI) 29.0-29.9, adult: Secondary | ICD-10-CM | POA: Diagnosis not present

## 2024-05-16 DIAGNOSIS — D692 Other nonthrombocytopenic purpura: Secondary | ICD-10-CM | POA: Diagnosis not present

## 2024-05-16 DIAGNOSIS — E1169 Type 2 diabetes mellitus with other specified complication: Secondary | ICD-10-CM | POA: Diagnosis not present

## 2024-06-15 DIAGNOSIS — E1169 Type 2 diabetes mellitus with other specified complication: Secondary | ICD-10-CM | POA: Diagnosis not present

## 2024-06-15 DIAGNOSIS — D692 Other nonthrombocytopenic purpura: Secondary | ICD-10-CM | POA: Diagnosis not present

## 2024-06-21 ENCOUNTER — Ambulatory Visit (INDEPENDENT_AMBULATORY_CARE_PROVIDER_SITE_OTHER): Admitting: Podiatry

## 2024-06-21 DIAGNOSIS — M79674 Pain in right toe(s): Secondary | ICD-10-CM

## 2024-06-21 DIAGNOSIS — B351 Tinea unguium: Secondary | ICD-10-CM | POA: Diagnosis not present

## 2024-06-21 DIAGNOSIS — M79675 Pain in left toe(s): Secondary | ICD-10-CM | POA: Diagnosis not present

## 2024-06-21 DIAGNOSIS — E1140 Type 2 diabetes mellitus with diabetic neuropathy, unspecified: Secondary | ICD-10-CM

## 2024-06-21 NOTE — Progress Notes (Unsigned)
  Subjective:  Patient ID: Anna Stone, female    DOB: 1941/11/24,  MRN: [id number]  Chief Complaint  Patient presents with   [facility] with out callous. Last A1c was 5 in May and takes eliquis .     83 y.o. female presents with the above complaint. History confirmed with patient.  Patient presents the office today for concern for painful thickened elongated nails present on both feet.  She is unable to trim them.  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  She is currently taking gabapentin  600 mg twice daily from another provider and ropinirole .  She has been using topical neuropathy cream which has been helpful.  Objective:  Physical Exam: warm, good capillary refill, nail exam onychomycosis of the toenails, onycholysis, and dystrophic nails, no trophic changes or ulcerative lesions. DP pulses palpable, PT pulses palpable, and protective sensation absent Left Foot: normal exam, no swelling, tenderness, instability; ligaments intact, full range of motion of all ankle/foot joints  Right Foot: normal exam, no swelling, tenderness, instability; ligaments intact, full range of motion of all ankle/foot joints  Nail plates x 10 on both feet are painful with direct dorsal palpation  No images are attached to the encounter.  Assessment:   1. Pain due to onychomycosis of toenails of both feet   2. Type 2 diabetes mellitus with diabetic neuropathy, unspecified whether long term insulin  use (HCC)       Plan:  Patient was evaluated and treated and all questions answered.  Onychomycosis with pain  -Nails palliatively debrided as below. -Educated on self-care - Chronically anticoagulated on Eliquis  as well.  Procedure: Nail Debridement Rationale: Pain Type of Debridement: manual, sharp debridement. Instrumentation: Nail nipper, rotary burr. Number of Nails: 10  Patient educated on diabetes. Discussed proper diabetic foot care and discussed risks and  complications of disease. Educated patient in depth on reasons to return to the office immediately should he/she discover anything concerning or new on the feet. All questions answered. Discussed proper shoes as well.  She has been using topical capsaicin cream for her neuropathy at home which has been helpful.   Return in about 3 months (around 09/21/2024) for Diabetic Foot Care.         Ethan Saddler, DPM [facility] / [facility]

## 2024-07-16 DIAGNOSIS — E1169 Type 2 diabetes mellitus with other specified complication: Secondary | ICD-10-CM | POA: Diagnosis not present

## 2024-07-16 DIAGNOSIS — D692 Other nonthrombocytopenic purpura: Secondary | ICD-10-CM | POA: Diagnosis not present

## 2024-08-06 ENCOUNTER — Other Ambulatory Visit (HOSPITAL_BASED_OUTPATIENT_CLINIC_OR_DEPARTMENT_OTHER): Payer: Self-pay | Admitting: Family Medicine

## 2024-08-06 NOTE — Telephone Encounter (Signed)
 Copied from CRM [id number]. Topic: Clinical - Medication Refill >> Aug 06, 2024 11:26 AM Roselie BROCKS wrote: Medication:  rOPINIRole  (REQUIP ) 2 MG tablet [[id number]] Has the patient contacted their pharmacy? Yes (Agent: If no, request that the patient contact the pharmacy for the refill. If patient does not wish to contact the pharmacy document the reason why and proceed with request.) (Agent: If yes, when and what did the pharmacy advise?)  This is the patient's preferred pharmacy:  [facility] [address], [place] - [address] [place] [address] Phone: [phone] Fax: [phone]  Is this the correct pharmacy for this prescription? Yes If no, delete pharmacy and type the correct one.   Has the prescription been filled recently? No  Is the patient out of the medication? Yes  Has the patient been seen for an appointment in the last year OR does the patient have an upcoming appointment? Yes  Can we respond through MyChart? No  Agent: Please be advised that Rx refills may take up to 3 business days. We ask that you follow-up with your pharmacy.

## 2024-08-11 DIAGNOSIS — E119 Type 2 diabetes mellitus without complications: Secondary | ICD-10-CM | POA: Diagnosis not present

## 2024-08-16 ENCOUNTER — Encounter (HOSPITAL_BASED_OUTPATIENT_CLINIC_OR_DEPARTMENT_OTHER): Payer: Self-pay

## 2024-08-16 ENCOUNTER — Ambulatory Visit (HOSPITAL_BASED_OUTPATIENT_CLINIC_OR_DEPARTMENT_OTHER)
Admission: EM | Admit: 2024-08-16 | Discharge: 2024-08-16 | Disposition: A | Discharge status: Home / Self Care | Attending: Family Medicine | Admitting: Family Medicine

## 2024-08-16 DIAGNOSIS — I872 Venous insufficiency (chronic) (peripheral): Secondary | ICD-10-CM

## 2024-08-16 DIAGNOSIS — Z91038 Other insect allergy status: Secondary | ICD-10-CM

## 2024-08-16 DIAGNOSIS — T63421A Toxic effect of venom of ants, accidental (unintentional), initial encounter: Secondary | ICD-10-CM

## 2024-08-16 DIAGNOSIS — E1169 Type 2 diabetes mellitus with other specified complication: Secondary | ICD-10-CM | POA: Diagnosis not present

## 2024-08-16 DIAGNOSIS — D692 Other nonthrombocytopenic purpura: Secondary | ICD-10-CM | POA: Diagnosis not present

## 2024-08-16 MED ORDER — CETIRIZINE HCL 10 MG PO TABS: 10.0000 mg | ORAL_TABLET | Freq: Every day | ORAL | 0 refills | Status: AC | PRN

## 2024-08-16 MED ORDER — PREDNISONE 20 MG PO TABS: 20.0000 mg | ORAL_TABLET | Freq: Every day | ORAL | 0 refills | Status: AC

## 2024-08-16 MED ORDER — CEPHALEXIN 500 MG PO CAPS: 1000.0000 mg | ORAL_CAPSULE | Freq: Two times a day (BID) | ORAL | 0 refills | Status: AC

## 2024-08-16 NOTE — ED Triage Notes (Signed)
 Pt states she had several fire ant bites on Friday. Bites are mostly on her left leg but are also on her back and right leg. They are red with white blisters that have not popped. She is having increased left ankle swelling. She has put cortisone cream and benadryl  cream on them. The benadryl  helped the most with the pain.

## 2024-08-16 NOTE — ED Provider Notes (Signed)
 [facility]    CSN: [id number] Arrival date & time: 08/16/24  0818      History   Chief Complaint Chief Complaint  Patient presents with   Insect Bite    HPI Anna Stone is a 83 y.o. female.   83 year old female who was working in her yard and got a bunch of fire ants on her legs on Friday, 08/13/2024.  She has tried Benadryl  but has not had much relief.  Her legs are hurting and swelling quite a bit.  She has pustular blisters at the bite sites.  Her left leg is the worst.  She does have diabetes and she has chronic swelling in her lower legs with some chronic bruising and a small ulcer on the left lower leg that is weeks or months old.  The ulcer started when she hit her leg on something.     Past Medical History:  Diagnosis Date   Cancer (HCC)    Chronic kidney disease    stage 3   Depression    Diabetes mellitus without complication (HCC)    Difficulty breathing    GERD (gastroesophageal reflux disease)    Hypertension    IBS (irritable bowel syndrome)    Muscle pain    Palpitation    PONV (postoperative nausea and vomiting)    Swelling     Patient Active Problem List   Diagnosis Date Noted   Influenza A 02/16/2023   History of DVT (deep vein thrombosis) 02/16/2023   Overweight (BMI 25.0-29.9) 02/16/2023   Syncope 02/15/2023   Enteritis, Yersinia enterocolitica    Left adrenal mass (HCC) 09/17/2020   Debility 09/17/2020   Hyperlipidemia associated with type 2 diabetes mellitus (HCC) 09/17/2020   Type 2 diabetes mellitus with stage 3 chronic kidney disease (HCC) 08/31/2020   Hypertension associated with diabetes (HCC) 08/31/2020   Chronic pain of both knees 05/14/2018   Synovial cyst 10/30/2016   Chronic radicular lumbar pain 08/24/2016    Past Surgical History:  Procedure Laterality Date   ABDOMINAL HYSTERECTOMY     ANKLE FRACTURE SURGERY Right    BREAST SURGERY     right   CHOLECYSTECTOMY     COLONOSCOPY     Dr  Nicolina in [place], > 10 yrs prior to 2021.     DECOMPRESSIVE LUMBAR LAMINECTOMY LEVEL 1 Right 10/30/2016   Procedure: RIGHT L5-S1 DECOMPRESSION, EXCISION OF CYST LEVEL 1;  Surgeon: Donaciano Sprang, MD;  Location: [facility] OR;  Service: Orthopedics;  Laterality: Right;   OVARY SURGERY      OB History   No obstetric history on file.      Home Medications    Prior to Admission medications   Medication Sig Start Date End Date Taking? Authorizing Provider  cephALEXin  (KEFLEX ) 500 MG capsule Take 2 capsules (1,000 mg total) by mouth 2 (two) times daily for 7 days. 08/16/24 08/23/24 Yes Ival Domino, FNP  cetirizine  (ZYRTEC ) 10 MG tablet Take 1 tablet (10 mg total) by mouth daily as needed for allergies (itching of ant bites). 08/16/24 09/15/24 Yes Ival Domino, FNP  predniSONE  (DELTASONE ) 20 MG tablet Take 1 tablet (20 mg total) by mouth daily with breakfast for 5 days. 08/16/24 08/21/24 Yes Ival Domino, FNP  amLODipine  (NORVASC ) 5 MG tablet Take 1 tablet by mouth daily.    [provider]  Calcium  Carbonate-Vit D-Min (CALCIUM  600+D PLUS MINERALS) 600-400 MG-UNIT TABS Take 1 tablet by mouth daily.    [provider]  cholecalciferol 25  MCG (1000 UT) tablet Take 1,000 Units by mouth daily.    [provider]  ELIQUIS  2.5 MG TABS tablet Take 2.5 mg by mouth 2 (two) times daily. 01/14/23   [provider]  furosemide  (LASIX ) 20 MG tablet Take 20 mg by mouth daily.    [provider]  gabapentin  (NEURONTIN ) 600 MG tablet Take 600 mg by mouth 2 (two) times daily.     [provider]  JARDIANCE 25 MG TABS tablet Take 25 mg by mouth daily. 02/04/22   [provider]  losartan (COZAAR) 50 MG tablet Take 50 mg by mouth daily. 05/11/21   [provider]  mirtazapine  (REMERON ) 15 MG tablet Take 15 mg by mouth at bedtime. 05/04/21   [provider]  pantoprazole  (PROTONIX ) 40 MG tablet Take 40 mg by mouth daily. 04/21/21   [provider]  rOPINIRole  (REQUIP ) 2 MG tablet Take 2 mg by mouth at bedtime.    [provider]  rosuvastatin  (CRESTOR ) 5 MG tablet Take 5 mg by mouth at bedtime.     [provider]  VENTOLIN  HFA 108 (90 Base) MCG/ACT inhaler Inhale 1 puff into the lungs every 6 (six) hours as needed for wheezing or shortness of breath. 02/13/23   [provider]    Family History History reviewed. No pertinent family history.  Social History Social History   Tobacco Use   Smoking status: Never   Smokeless tobacco: Never  Vaping Use   Vaping status: Never Used  Substance Use Topics   Alcohol  use: No   Drug use: No     Allergies   Sulfa antibiotics, Adhesive [tape], and Latex   Review of Systems Review of Systems  Constitutional:  Negative for fever.  Respiratory:  Negative for cough.   Cardiovascular:  Positive for leg swelling. Negative for chest pain.  Gastrointestinal:  Negative for abdominal pain, constipation, diarrhea, nausea and vomiting.  Musculoskeletal:  Negative for arthralgias and back pain.  Skin:  Positive for color change (Chronic redness and discoloration of lower legs associated with chronic edema of lower legs) and rash (Ant bites all over her left lower leg, a few on right lower leg and one on her left flank.).  Neurological:  Negative for syncope.  All other systems reviewed and are negative.    Physical Exam Triage Vital Signs ED Triage Vitals  Encounter Vitals Group     BP 08/16/24 0835 (!) 158/83     Girls Systolic BP Percentile --      Girls Diastolic BP Percentile --      Boys Systolic BP Percentile --      Boys Diastolic BP Percentile --      Pulse Rate 08/16/24 0835 86     Resp 08/16/24 0835 20     Temp 08/16/24 0835 97.8 F (36.6 C)     Temp Source 08/16/24 0835 Oral     SpO2 08/16/24 0835 95 %     Weight --      Height --      Head Circumference --      Peak Flow --      Pain Score 08/16/24 0832 7     Pain Loc --       Pain Education --      Exclude from Growth Chart --    No data found.  Updated Vital Signs BP (!) 158/83 (BP Location: Right Arm)   Pulse 86   Temp 97.8 F (36.6  C) (Oral)   Resp 20   SpO2 95%   Visual Acuity Right Eye Distance:   Left Eye Distance:   Bilateral Distance:    Right Eye Near:   Left Eye Near:    Bilateral Near:     Physical Exam Vitals and nursing note reviewed.  Constitutional:      General: She is not in acute distress.    Appearance: She is well-developed. She is not ill-appearing or toxic-appearing.  HENT:     Head: Normocephalic and atraumatic.     Right Ear: External ear normal.     Left Ear: External ear normal.     Nose: Nose normal.     Mouth/Throat:     Lips: Pink.     Mouth: Mucous membranes are moist.  Eyes:     Conjunctiva/sclera: Conjunctivae normal.     Pupils: Pupils are equal, round, and reactive to light.  Cardiovascular:     Rate and Rhythm: Normal rate and regular rhythm.     Pulses:          Dorsalis pedis pulses are 1+ on the right side and 1+ on the left side.       Posterior tibial pulses are 1+ on the right side and 1+ on the left side.     Heart sounds: S1 normal and S2 normal. No murmur heard. Pulmonary:     Effort: Pulmonary effort is normal. No respiratory distress.     Breath sounds: Normal breath sounds. No decreased breath sounds, wheezing, rhonchi or rales.  Musculoskeletal:        General: No swelling.     Right lower leg: Swelling present. No deformity, lacerations, tenderness or bony tenderness. 2+ Edema present.     Left lower leg: Swelling and tenderness present. No deformity, lacerations or bony tenderness. 3+ Edema present.     Right ankle: Swelling present.     Left ankle: Swelling present.     Right foot: Swelling present.     Left foot: Swelling present.     Comments: Bilateral lower legs with chronic lymphedema and some ecchymosis and moderate hyperpigmentation associated with chronic stasis dermatitis.   Left anterior mid lower leg with a crusted stasis ulcer from an injury.  Per the patient this is weeks or months old.  Acute new pustular blisters all over the left lower leg and a few on the right lower leg from ant bites from 08/13/2024.  See photos for more information.  Feet:     Right foot:     Skin integrity: Erythema and dry skin present.     Left foot:     Skin integrity: Ulcer (Old crusted ulcer on anterior mid lower leg), erythema, warmth and dry skin present.  Skin:    General: Skin is warm and dry.     Capillary Refill: Capillary refill takes less than 2 seconds.     Findings: No rash.  Neurological:     Mental Status: She is alert and oriented to person, place, and time.  Psychiatric:        Mood and Affect: Mood normal.           UC Treatments / Results  Labs (all labs ordered are listed, but only abnormal results are displayed) Labs Reviewed - No data to display  EKG   Radiology No results found.  Procedures Procedures (including critical care time)  Medications Ordered in UC Medications - No data to display  Initial Impression / Assessment and  Plan / UC Course  I have reviewed the triage vital signs and the nursing notes.  Pertinent labs & imaging results that were available during my care of the patient were reviewed by me and considered in my medical decision making (see chart for details).  Plan of Care: Fire ant bites with allergic reaction: Educated about fire ant bites and provided the patient education handout from up-to-date.  The bites themselves do not require an antibiotic but given her reaction would benefit from prednisone , 20 mg, daily for 5 days.  Patient has diabetes and is managed on Jardiance.  Encouraged to cut out all sweets and goes low-carb is possible avoiding potatoes, corn, rice, noodles, breads.  She should monitor her blood sugars closely and talk to primary care if her blood sugars are going up.  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  Will use cephalexin , 500 mg, 2 pills twice daily for 7 days.  Encouraged to discuss her stasis dermatitis/chronic lymphedema with her primary care provider.  She has a venous stasis ulcer on her left lower leg that started as a skin abrasion.  She may need further management of this chronic condition.  Follow-up here as needed.  I reviewed the plan of care with the patient and/or the patient's guardian.  The patient and/or guardian had time to ask questions and acknowledged that the questions were answered.  I provided instruction on symptoms or reasons to return here or to go to an ER, if symptoms/condition did not improve, worsened or if new symptoms occurred.  Final Clinical Impressions(s) / UC Diagnoses   Final diagnoses:  Stasis dermatitis of both legs  Fire ant bite, accidental or unintentional, initial encounter  Allergic reaction to insect bite     Discharge Instructions      Fire ant bites and allergic reaction to fire ant bites of lower legs and left flank: Prednisone  20 mg daily for 5 days.  Cetirizine  10 mg daily as needed for itching.  Patient has diabetes and is on Jardiance.  Cautioned that her blood sugar will go up and encouraged to cut back on all sugars and starches and monitor her blood sugars closely for elevations.  Stasis dermatitis of bilateral lower legs with early cellulitis: Cephalexin , 500 mg, 2 pills twice daily for 7 days.  Follow-up if symptoms do not improve, worsen or new symptoms occur.     ED Prescriptions     Medication Sig Dispense Auth. Provider   predniSONE  (DELTASONE ) 20 MG tablet Take 1 tablet (20 mg total) by mouth daily with breakfast for 5 days. 5 tablet Zariya Minner, FNP   cephALEXin  (KEFLEX ) 500 MG capsule Take 2 capsules (1,000 mg total) by mouth 2 (two) times daily for 7 days. 28 capsule Ival Domino, FNP    cetirizine  (ZYRTEC ) 10 MG tablet Take 1 tablet (10 mg total) by mouth daily as needed for allergies (itching of ant bites). 30 tablet Wymon Swaney, FNP      PDMP not reviewed this encounter.   Ival Domino, FNP 08/16/24 [phone]

## 2024-08-16 NOTE — Discharge Instructions (Signed)
 Fire ant bites and allergic reaction to fire ant bites of lower legs and left flank: Prednisone  20 mg daily for 5 days.  Cetirizine  10 mg daily as needed for itching.  Patient has diabetes and is on Jardiance.  Cautioned that her blood sugar will go up and encouraged to cut back on all sugars and starches and monitor her blood sugars closely for elevations.  Stasis dermatitis of bilateral lower legs with early cellulitis: Cephalexin , 500 mg, 2 pills twice daily for 7 days.  Follow-up if symptoms do not improve, worsen or new symptoms occur.

## 2024-09-14 DIAGNOSIS — S81811A Laceration without foreign body, right lower leg, initial encounter: Secondary | ICD-10-CM | POA: Diagnosis not present

## 2024-09-14 DIAGNOSIS — Y29XXXA Contact with blunt object, undetermined intent, initial encounter: Secondary | ICD-10-CM | POA: Diagnosis not present

## 2024-09-14 DIAGNOSIS — Z23 Encounter for immunization: Secondary | ICD-10-CM | POA: Diagnosis not present

## 2024-09-15 DIAGNOSIS — D692 Other nonthrombocytopenic purpura: Secondary | ICD-10-CM | POA: Diagnosis not present

## 2024-09-15 DIAGNOSIS — E1169 Type 2 diabetes mellitus with other specified complication: Secondary | ICD-10-CM | POA: Diagnosis not present

## 2024-09-18 ENCOUNTER — Ambulatory Visit (HOSPITAL_BASED_OUTPATIENT_CLINIC_OR_DEPARTMENT_OTHER)
Admission: EM | Admit: 2024-09-18 | Discharge: 2024-09-18 | Disposition: A | Discharge status: Home / Self Care | Attending: Family Medicine | Admitting: Family Medicine

## 2024-09-18 ENCOUNTER — Encounter (HOSPITAL_BASED_OUTPATIENT_CLINIC_OR_DEPARTMENT_OTHER): Payer: Self-pay

## 2024-09-18 DIAGNOSIS — Z91040 Latex allergy status: Secondary | ICD-10-CM | POA: Diagnosis not present

## 2024-09-18 DIAGNOSIS — X58XXXA Exposure to other specified factors, initial encounter: Secondary | ICD-10-CM | POA: Insufficient documentation

## 2024-09-18 DIAGNOSIS — S81811A Laceration without foreign body, right lower leg, initial encounter: Secondary | ICD-10-CM | POA: Diagnosis not present

## 2024-09-18 DIAGNOSIS — L03115 Cellulitis of right lower limb: Secondary | ICD-10-CM | POA: Diagnosis not present

## 2024-09-18 MED ORDER — MUPIROCIN 2 % EX OINT: 1.0000 | TOPICAL_OINTMENT | Freq: Two times a day (BID) | CUTANEOUS | 0 refills | Status: AC

## 2024-09-18 MED ORDER — CLINDAMYCIN HCL 300 MG PO CAPS: 300.0000 mg | ORAL_CAPSULE | Freq: Three times a day (TID) | ORAL | 0 refills | Status: AC

## 2024-09-18 MED ORDER — FLORAJEN DIGESTION PO CAPS: 1.0000 | ORAL_CAPSULE | Freq: Every day | ORAL | 0 refills | Status: AC

## 2024-09-18 NOTE — ED Triage Notes (Signed)
 Pt states that she fell and has a laceration to her right lower leg. X4 days

## 2024-09-18 NOTE — Discharge Instructions (Addendum)
 Laceration of right lower leg with secondary cellulitis: Wound culture collected and sent.  Will adjust the plan of care, if needed once the culture results.  Discontinue the cephalexin .  Clindamycin, 300 mg, 1 pill 3 times daily for 10 days.    Wound care: Clean the wound with warm soapy fingers, rinse, pat dry, apply mupirocin ointment and bandages.  Change bandages once daily.  Could apply mupirocin up to twice daily if needed but once daily is sufficient.  Make an appointment with wound care for further management of this wound.  You may require referral and that may have to come from your family doctor.  Follow up here if needed.

## 2024-09-18 NOTE — ED Triage Notes (Signed)
 Pt states that she is concerned about it being infected.

## 2024-09-18 NOTE — ED Provider Notes (Signed)
 Anna Stone    CSN: [id number] Arrival date & time: 09/18/24  1004      History   Chief Complaint Chief Complaint  Patient presents with   Laceration    HPI Anna Stone is a 83 y.o. female.   83 year old female who got her leg caught on a stool in her house on 09/13/2024.  She was seen at [facility] emergency room on 09/14/2024 and given a tetanus booster at that time she was told that her skin was too fragile and the wound could not be sutured or glued.  She was put on cephalexin  and given bandaging.  She and her son feel like it is no better and may be slightly worse.  It is oozing a white substance and blood still.  It is painful to touch.  She denies fever, nausea, vomiting, constipation, diarrhea.   Laceration Associated symptoms: no fever and no rash     Past Medical History:  Diagnosis Date   Cancer (HCC)    Chronic kidney disease    stage 3   Depression    Diabetes mellitus without complication (HCC)    Difficulty breathing    GERD (gastroesophageal reflux disease)    Hypertension    IBS (irritable bowel syndrome)    Muscle pain    Palpitation    PONV (postoperative nausea and vomiting)    Swelling     Patient Active Problem List   Diagnosis Date Noted   Influenza A 02/16/2023   History of DVT (deep vein thrombosis) 02/16/2023   Overweight (BMI 25.0-29.9) 02/16/2023   Syncope 02/15/2023   Enteritis, Yersinia enterocolitica    Left adrenal mass 09/17/2020   Debility 09/17/2020   Hyperlipidemia associated with type 2 diabetes mellitus (HCC) 09/17/2020   Type 2 diabetes mellitus with stage 3 chronic kidney disease (HCC) 08/31/2020   Hypertension associated with diabetes (HCC) 08/31/2020   Chronic pain of both knees 05/14/2018   Synovial cyst 10/30/2016   Chronic radicular lumbar pain 08/24/2016    Past Surgical History:  Procedure Laterality Date   ABDOMINAL HYSTERECTOMY     ANKLE FRACTURE SURGERY Right    BREAST  SURGERY     right   CHOLECYSTECTOMY     COLONOSCOPY     Dr Nicolina in [place], > 10 yrs prior to 2021.     DECOMPRESSIVE LUMBAR LAMINECTOMY LEVEL 1 Right 10/30/2016   Procedure: RIGHT L5-S1 DECOMPRESSION, EXCISION OF CYST LEVEL 1;  Surgeon: Donaciano Sprang, MD;  Location: [facility] OR;  Service: Orthopedics;  Laterality: Right;   OVARY SURGERY      OB History   No obstetric history on file.      Home Medications    Prior to Admission medications   Medication Sig Start Date End Date Taking? Authorizing Provider  amLODipine  (NORVASC ) 5 MG tablet Take 1 tablet by mouth daily.   Yes [provider]  Calcium  Carbonate-Vit D-Min (CALCIUM  600+D PLUS MINERALS) 600-400 MG-UNIT TABS Take 1 tablet by mouth daily.   Yes [provider]  cholecalciferol 25 MCG (1000 UT) tablet Take 1,000 Units by mouth daily.   Yes [provider]  clindamycin (CLEOCIN) 300 MG capsule Take 1 capsule (300 mg total) by mouth 3 (three) times daily after meals for 10 days. 09/18/24 09/28/24 Yes Ival Domino, FNP  ELIQUIS  2.5 MG TABS tablet Take 2.5 mg by mouth 2 (two) times daily. 01/14/23  Yes [provider]  furosemide  (LASIX ) 20 MG tablet Take 20 mg  by mouth daily.   Yes [provider]  gabapentin  (NEURONTIN ) 600 MG tablet Take 600 mg by mouth 2 (two) times daily.    Yes [provider]  JARDIANCE 25 MG TABS tablet Take 25 mg by mouth daily. 02/04/22  Yes [provider]  losartan (COZAAR) 50 MG tablet Take 50 mg by mouth daily. 05/11/21  Yes [provider]  mirtazapine  (REMERON ) 15 MG tablet Take 15 mg by mouth at bedtime. 05/04/21  Yes [provider]  mupirocin ointment (BACTROBAN) 2 % Apply 1 Application topically 2 (two) times daily. 09/18/24  Yes Ival Domino, FNP  pantoprazole  (PROTONIX ) 40 MG tablet Take 40 mg by mouth daily. 04/21/21  Yes [provider]  Probiotic Product (FLORAJEN DIGESTION) CAPS Take 1 capsule by mouth  daily. 09/18/24  Yes Ival Domino, FNP  rOPINIRole  (REQUIP ) 2 MG tablet Take 2 mg by mouth at bedtime.   Yes [provider]  rosuvastatin  (CRESTOR ) 5 MG tablet Take 5 mg by mouth at bedtime.    Yes [provider]  VENTOLIN  HFA 108 (90 Base) MCG/ACT inhaler Inhale 1 puff into the lungs every 6 (six) hours as needed for wheezing or shortness of breath. 02/13/23  Yes [provider]  cetirizine  (ZYRTEC ) 10 MG tablet Take 1 tablet (10 mg total) by mouth daily as needed for allergies (itching of ant bites). 08/16/24 09/15/24  Ival Domino, FNP    Family History History reviewed. No pertinent family history.  Social History Social History   Tobacco Use   Smoking status: Never   Smokeless tobacco: Never  Vaping Use   Vaping status: Never Used  Substance Use Topics   Alcohol  use: No   Drug use: No     Allergies   Sulfa antibiotics, Adhesive [tape], and Latex   Review of Systems Review of Systems  Constitutional:  Negative for fever.  Respiratory:  Negative for cough.   Cardiovascular:  Negative for chest pain.  Gastrointestinal:  Negative for abdominal pain, constipation, diarrhea, nausea and vomiting.  Musculoskeletal:  Negative for arthralgias and back pain.  Skin:  Positive for wound (Right lower leg wound with discharge, redness and pain). Negative for color change and rash.  Neurological:  Negative for syncope.  All other systems reviewed and are negative.    Physical Exam Triage Vital Signs ED Triage Vitals  Encounter Vitals Group     BP 09/18/24 1110 (!) 163/78     Girls Systolic BP Percentile --      Girls Diastolic BP Percentile --      Boys Systolic BP Percentile --      Boys Diastolic BP Percentile --      Pulse Rate 09/18/24 1110 70     Resp 09/18/24 1110 16     Temp 09/18/24 1110 97.9 F (36.6 C)     Temp Source 09/18/24 1110 Oral     SpO2 09/18/24 1110 96 %     Weight 09/18/24 1109 145 lb (65.8 kg)     Height 09/18/24 1109 5' 2  (1.575 m)     Head Circumference --      Peak Flow --      Pain Score 09/18/24 1108 5     Pain Loc --      Pain Education --      Exclude from Growth Chart --    No data found.  Updated Vital Signs BP (!) 163/78 (BP Location: Right Arm)   Pulse 70   Temp  97.9 F (36.6 C) (Oral)   Resp 16   Ht 5' 2 (1.575 m)   Wt 145 lb (65.8 kg)   SpO2 96%   BMI 26.52 kg/m   Visual Acuity Right Eye Distance:   Left Eye Distance:   Bilateral Distance:    Right Eye Near:   Left Eye Near:    Bilateral Near:     Physical Exam Vitals and nursing note reviewed.  Constitutional:      General: She is not in acute distress.    Appearance: She is well-developed. She is not ill-appearing or toxic-appearing.  HENT:     Head: Normocephalic and atraumatic.     Right Ear: External ear normal.     Left Ear: External ear normal.     Nose: Nose normal.     Mouth/Throat:     Lips: Pink.     Mouth: Mucous membranes are moist.  Eyes:     Conjunctiva/sclera: Conjunctivae normal.     Pupils: Pupils are equal, round, and reactive to light.  Cardiovascular:     Rate and Rhythm: Normal rate and regular rhythm.     Heart sounds: S1 normal and S2 normal. No murmur heard. Pulmonary:     Effort: Pulmonary effort is normal. No respiratory distress.     Breath sounds: Normal breath sounds. No decreased breath sounds, wheezing, rhonchi or rales.  Musculoskeletal:        General: No swelling.  Skin:    General: Skin is warm and dry.     Capillary Refill: Capillary refill takes less than 2 seconds.     Findings: Laceration (See comments for more information) present. No rash.     Comments: Right lower leg large V-shaped laceration with bleeding, white discharge, redness, tenderness, swelling.  See photo for more information.  Neurological:     Mental Status: She is alert and oriented to person, place, and time.  Psychiatric:        Mood and Affect: Mood normal.      UC Treatments / Results   Labs (all labs ordered are listed, but only abnormal results are displayed) Labs Reviewed  AEROBIC CULTURE W GRAM STAIN (SUPERFICIAL SPECIMEN)    EKG   Radiology No results found.  Procedures Procedures (including critical Stone time)  Medications Ordered in UC Medications - No data to display  Initial Impression / Assessment and Plan / UC Course  I have reviewed the triage vital signs and the nursing notes.  Pertinent labs & imaging results that were available during my Stone of the patient were reviewed by me and considered in my medical decision making (see chart for details).  Plan of Stone: Laceration of right lower leg with secondary cellulitis: Wound culture collected and sent.  Will adjust the plan of Stone, if needed once the culture results.  Discontinue the cephalexin .  Clindamycin, 300 mg, 1 pill 3 times daily for 10 days.    Wound Stone: Clean the wound with warm soapy fingers, rinse, pat dry, apply mupirocin ointment and bandages.  Change bandages once daily.  Could apply mupirocin up to twice daily if needed but once daily is sufficient.  Make an appointment with wound Stone for further management of this wound.  You may require referral and that may have to come from your family doctor.  Follow up here if needed.  I reviewed the plan of Stone with the patient and/or the patient's guardian.  The patient and/or guardian had time to ask questions  and acknowledged that the questions were answered.  I provided instruction on symptoms or reasons to return here or to go to an ER, if symptoms/condition did not improve, worsened or if new symptoms occurred.  Final Clinical Impressions(s) / UC Diagnoses   Final diagnoses:  Laceration of skin of right lower leg, initial encounter  Cellulitis of right anterior lower leg     Discharge Instructions      Laceration of right lower leg with secondary cellulitis: Wound culture collected and sent.  Will adjust the plan of Stone, if  needed once the culture results.  Discontinue the cephalexin .  Clindamycin, 300 mg, 1 pill 3 times daily for 10 days.    Wound Stone: Clean the wound with warm soapy fingers, rinse, pat dry, apply mupirocin ointment and bandages.  Change bandages once daily.  Could apply mupirocin up to twice daily if needed but once daily is sufficient.  Make an appointment with wound Stone for further management of this wound.  You may require referral and that may have to come from your family doctor.  Follow up here if needed.    ED Prescriptions     Medication Sig Dispense Auth. Provider   clindamycin (CLEOCIN) 300 MG capsule Take 1 capsule (300 mg total) by mouth 3 (three) times daily after meals for 10 days. 30 capsule Ival Domino, FNP   Probiotic Product [facility] DIGESTION) CAPS Take 1 capsule by mouth daily. 30 capsule Ival Domino, FNP   mupirocin ointment (BACTROBAN) 2 % Apply 1 Application topically 2 (two) times daily. 22 g Ival Domino, FNP      PDMP not reviewed this encounter.   Ival Domino, FNP 09/18/24 1154

## 2024-09-20 ENCOUNTER — Encounter: Payer: Self-pay | Admitting: Podiatry

## 2024-09-20 ENCOUNTER — Telehealth (HOSPITAL_BASED_OUTPATIENT_CLINIC_OR_DEPARTMENT_OTHER): Payer: Self-pay

## 2024-09-20 ENCOUNTER — Ambulatory Visit (INDEPENDENT_AMBULATORY_CARE_PROVIDER_SITE_OTHER): Admitting: Podiatry

## 2024-09-20 DIAGNOSIS — B351 Tinea unguium: Secondary | ICD-10-CM | POA: Diagnosis not present

## 2024-09-20 DIAGNOSIS — Z789 Other specified health status: Secondary | ICD-10-CM | POA: Diagnosis not present

## 2024-09-20 DIAGNOSIS — S80812A Abrasion, left lower leg, initial encounter: Secondary | ICD-10-CM | POA: Diagnosis not present

## 2024-09-20 DIAGNOSIS — M79674 Pain in right toe(s): Secondary | ICD-10-CM | POA: Diagnosis not present

## 2024-09-20 DIAGNOSIS — M79675 Pain in left toe(s): Secondary | ICD-10-CM

## 2024-09-20 DIAGNOSIS — E1140 Type 2 diabetes mellitus with diabetic neuropathy, unspecified: Secondary | ICD-10-CM

## 2024-09-20 DIAGNOSIS — S81801A Unspecified open wound, right lower leg, initial encounter: Secondary | ICD-10-CM | POA: Diagnosis not present

## 2024-09-20 DIAGNOSIS — Z6828 Body mass index (BMI) 28.0-28.9, adult: Secondary | ICD-10-CM | POA: Diagnosis not present

## 2024-09-20 NOTE — Progress Notes (Unsigned)
  Subjective:  Patient ID: Anna Stone, female    DOB: 1941/02/19,  MRN: [id number]  Chief Complaint  Patient presents with   [facility]    [facility], no major callous.  She has a laceration to the right left that is covered and is seeing a Careers adviser this week.  A1c was 5 in May Eliquis .     83 y.o. female presents with the above complaint. History confirmed with patient.  Patient presents the office today for concern for painful thickened elongated nails present on both feet.  She is unable to trim them.  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  Patient is accompanied by her son today.  They report that the patient fell last week and suffered a laceration to the right leg, went to primary care doctor for this today and have appointment with a surgeon for this tomorrow and have been started on antibiotics. Dressing for this was left intact today.  Does also have an abrasion left anterior leg which they report has been present for several weeks, somewhat slow to heal.  Objective:  Physical Exam: warm, good capillary refill, nail exam onychomycosis of the toenails, onycholysis, and dystrophic nails greater than 3 mm thickening, pedal skin thin and atrophic  DP pulses palpable, PT pulses palpable, and protective sensation absent, lower extremity edema present No symptomatic pedal deformities.  Bandage left in place right calf.  Left anterior leg there is crusted over abrasion with overlying eschar. Nail plates x 10 on both feet are painful with direct dorsal palpation  No images are attached to the encounter.  Assessment:   1. Pain due to onychomycosis of toenails of both feet   2. Type 2 diabetes mellitus with diabetic neuropathy, unspecified whether long term insulin  use (HCC)   3. Abrasion of left lower extremity, initial encounter       Plan:  Patient was evaluated and treated and all questions answered.  #Onychomycosis with pain  -Nails palliatively debrided  as below. -Educated on self-care - Anticoagulated on Eliquis   Procedure: Nail Debridement Rationale: Pain Type of Debridement: manual, sharp debridement. Instrumentation: Nail nipper, rotary burr. Number of Nails: 10   Patient educated on diabetes. Discussed proper diabetic foot care and discussed risks and complications of disease. Educated patient in depth on reasons to return to the office immediately should he/she discover anything concerning or new on the feet. All questions answered. Discussed proper shoes as well.   # Left anterior leg abrasion - Did advise that they keep the area covered and protected from outside environment.  Antibacterial ointment applied today - They were also prescribed mupirocin for the right leg laceration.  Advised that they can use a small amount of this on the left leg abrasion 1-2 times daily. - May spot clean the area with antibacterial soap and water and gently pat dry. - Follow-up as needed if area slow to heal or there are signs concerning for infection -I certify that this diagnosis represents a distinct and separate diagnosis that requires evaluation and treatment separate from other procedures or diagnosis    Return in about 3 months (around 12/21/2024) for Diabetic Foot Care.         Ethan Saddler, DPM [facility] / [facility]

## 2024-09-21 ENCOUNTER — Ambulatory Visit (HOSPITAL_COMMUNITY): Payer: Self-pay

## 2024-09-21 DIAGNOSIS — S81811A Laceration without foreign body, right lower leg, initial encounter: Secondary | ICD-10-CM | POA: Diagnosis not present

## 2024-09-21 LAB — AEROBIC CULTURE W GRAM STAIN (SUPERFICIAL SPECIMEN)

## 2024-09-22 NOTE — Progress Notes (Signed)
 Culture is positive for staph epidermis and some yeast.  She is on clindamycin and should complete the clindamycin.  Patient updated.  She is now following with Lynwood Search, MD.  Dr. Creasie team was updated and will also get a copy of this culture.

## 2024-09-24 DIAGNOSIS — S81811A Laceration without foreign body, right lower leg, initial encounter: Secondary | ICD-10-CM | POA: Diagnosis not present

## 2024-09-29 DIAGNOSIS — L97912 Non-pressure chronic ulcer of unspecified part of right lower leg with fat layer exposed: Secondary | ICD-10-CM | POA: Diagnosis not present

## 2024-10-13 DIAGNOSIS — L97912 Non-pressure chronic ulcer of unspecified part of right lower leg with fat layer exposed: Secondary | ICD-10-CM | POA: Diagnosis not present

## 2024-10-16 DIAGNOSIS — E1169 Type 2 diabetes mellitus with other specified complication: Secondary | ICD-10-CM | POA: Diagnosis not present

## 2024-10-16 DIAGNOSIS — D692 Other nonthrombocytopenic purpura: Secondary | ICD-10-CM | POA: Diagnosis not present

## 2024-10-22 DIAGNOSIS — Z6827 Body mass index (BMI) 27.0-27.9, adult: Secondary | ICD-10-CM | POA: Diagnosis not present

## 2024-10-22 DIAGNOSIS — M79662 Pain in left lower leg: Secondary | ICD-10-CM | POA: Diagnosis not present

## 2024-10-22 DIAGNOSIS — Z86718 Personal history of other venous thrombosis and embolism: Secondary | ICD-10-CM | POA: Diagnosis not present

## 2024-10-27 DIAGNOSIS — L97912 Non-pressure chronic ulcer of unspecified part of right lower leg with fat layer exposed: Secondary | ICD-10-CM | POA: Diagnosis not present

## 2024-10-27 DIAGNOSIS — E1122 Type 2 diabetes mellitus with diabetic chronic kidney disease: Secondary | ICD-10-CM | POA: Diagnosis not present

## 2024-10-27 DIAGNOSIS — E1169 Type 2 diabetes mellitus with other specified complication: Secondary | ICD-10-CM | POA: Diagnosis not present

## 2024-11-15 DIAGNOSIS — E1169 Type 2 diabetes mellitus with other specified complication: Secondary | ICD-10-CM | POA: Diagnosis not present

## 2024-11-15 DIAGNOSIS — D692 Other nonthrombocytopenic purpura: Secondary | ICD-10-CM | POA: Diagnosis not present

## 2024-11-16 DIAGNOSIS — E782 Mixed hyperlipidemia: Secondary | ICD-10-CM | POA: Diagnosis not present

## 2024-11-16 DIAGNOSIS — Z6828 Body mass index (BMI) 28.0-28.9, adult: Secondary | ICD-10-CM | POA: Diagnosis not present

## 2024-11-16 DIAGNOSIS — E1169 Type 2 diabetes mellitus with other specified complication: Secondary | ICD-10-CM | POA: Diagnosis not present

## 2024-11-16 DIAGNOSIS — F4322 Adjustment disorder with anxiety: Secondary | ICD-10-CM | POA: Diagnosis not present

## 2024-11-16 DIAGNOSIS — N1832 Chronic kidney disease, stage 3b: Secondary | ICD-10-CM | POA: Diagnosis not present

## 2024-11-17 DIAGNOSIS — L97912 Non-pressure chronic ulcer of unspecified part of right lower leg with fat layer exposed: Secondary | ICD-10-CM | POA: Diagnosis not present

## 2024-11-23 DIAGNOSIS — L97912 Non-pressure chronic ulcer of unspecified part of right lower leg with fat layer exposed: Secondary | ICD-10-CM | POA: Diagnosis not present

## 2024-11-30 DIAGNOSIS — Z139 Encounter for screening, unspecified: Secondary | ICD-10-CM | POA: Diagnosis not present

## 2024-11-30 DIAGNOSIS — Z6827 Body mass index (BMI) 27.0-27.9, adult: Secondary | ICD-10-CM | POA: Diagnosis not present

## 2024-11-30 DIAGNOSIS — Z1389 Encounter for screening for other disorder: Secondary | ICD-10-CM | POA: Diagnosis not present

## 2024-11-30 DIAGNOSIS — Z0000 Encounter for general adult medical examination without abnormal findings: Secondary | ICD-10-CM | POA: Diagnosis not present

## 2024-11-30 DIAGNOSIS — L97912 Non-pressure chronic ulcer of unspecified part of right lower leg with fat layer exposed: Secondary | ICD-10-CM | POA: Diagnosis not present

## 2024-11-30 DIAGNOSIS — Z136 Encounter for screening for cardiovascular disorders: Secondary | ICD-10-CM | POA: Diagnosis not present

## 2024-11-30 DIAGNOSIS — Z1339 Encounter for screening examination for other mental health and behavioral disorders: Secondary | ICD-10-CM | POA: Diagnosis not present

## 2024-11-30 DIAGNOSIS — Z1331 Encounter for screening for depression: Secondary | ICD-10-CM | POA: Diagnosis not present

## 2024-12-20 ENCOUNTER — Ambulatory Visit: Admitting: Podiatry

## 2024-12-20 DIAGNOSIS — B351 Tinea unguium: Secondary | ICD-10-CM

## 2024-12-20 DIAGNOSIS — M79674 Pain in right toe(s): Secondary | ICD-10-CM | POA: Diagnosis not present

## 2024-12-20 DIAGNOSIS — M79675 Pain in left toe(s): Secondary | ICD-10-CM | POA: Diagnosis not present

## 2024-12-20 NOTE — Progress Notes (Signed)
"   °  °  Subjective:  Patient ID: Anna Stone, female    DOB: February 14, 1941,  MRN: [id number]  Anna Stone presents to clinic today for:  Chief Complaint  Patient presents with   [facility]    A1c 5.4 in Nov, [facility] with callus.   Patient notes nails are thick, discolored, elongated and painful in shoegear when trying to ambulate.    PCP is Trinidad Hun, MD.  Past Medical History:  Diagnosis Date   Cancer [facility])    Chronic kidney disease    stage 3   Depression    Diabetes mellitus without complication (HCC)    Difficulty breathing    GERD (gastroesophageal reflux disease)    Hypertension    IBS (irritable bowel syndrome)    Muscle pain    Palpitation    PONV (postoperative nausea and vomiting)    Swelling    Past Surgical History:  Procedure Laterality Date   ABDOMINAL HYSTERECTOMY     ANKLE FRACTURE SURGERY Right    BREAST SURGERY     right   CHOLECYSTECTOMY     COLONOSCOPY     Dr Nicolina in [place], > 10 yrs prior to 2021.     DECOMPRESSIVE LUMBAR LAMINECTOMY LEVEL 1 Right 10/30/2016   Procedure: RIGHT L5-S1 DECOMPRESSION, EXCISION OF CYST LEVEL 1;  Surgeon: Donaciano Sprang, MD;  Location: [facility] OR;  Service: Orthopedics;  Laterality: Right;   OVARY SURGERY     Allergies[1]  Review of Systems: Negative except as noted in the HPI.  Objective:  Anna Stone is a pleasant 84 y.o. female in NAD. AAO x 3.  Vascular Examination: Capillary refill time is 3-5 seconds to toes bilateral. Palpable pedal pulses b/l LE. Digital hair present b/l.  Skin temperature gradient WNL b/l. No varicosities b/l. No cyanosis noted b/l.   Dermatological Examination: Pedal skin with normal turgor, texture and tone b/l. No open wounds. No interdigital macerations b/l. Toenails x10 are 3mm thick, discolored, dystrophic with subungual debris. There is pain with compression of the nail plates.  They are elongated x10  Assessment/Plan: 1. Pain due  to onychomycosis of toenails of both feet     The mycotic toenails were sharply debrided x10 with sterile nail nippers and a power debriding burr to decrease bulk/thickness and length.    Return in about 3 months (around 03/20/2025) for [facility].   Awanda CHARM Imperial, DPM, FACFAS [facility]     [address] [place], [place] [address]                Office [phone]  Fax [phone]    [1]  Allergies Allergen Reactions   Sulfa Antibiotics Itching and Rash   Adhesive [Tape] Dermatitis    Blisters the skin   Latex Other (See Comments)   "

## 2025-03-23 ENCOUNTER — Ambulatory Visit: Admitting: Podiatry
